# Patient Record
Sex: Female | Born: 1945 | Race: White | Hispanic: No | Smoking: Never smoker
Health system: Southern US, Community
[De-identification: ages and names within clinical notes are randomized; demographics above are authoritative.]

## PROBLEM LIST (undated history)

## (undated) DIAGNOSIS — Z888 Allergy status to other drugs, medicaments and biological substances status: Secondary | ICD-10-CM

## (undated) DIAGNOSIS — R112 Nausea with vomiting, unspecified: Secondary | ICD-10-CM

## (undated) DIAGNOSIS — I2089 Other forms of angina pectoris: Secondary | ICD-10-CM

## (undated) DIAGNOSIS — I739 Peripheral vascular disease, unspecified: Secondary | ICD-10-CM

## (undated) DIAGNOSIS — Z9889 Other specified postprocedural states: Secondary | ICD-10-CM

## (undated) DIAGNOSIS — I82409 Acute embolism and thrombosis of unspecified deep veins of unspecified lower extremity: Secondary | ICD-10-CM

## (undated) DIAGNOSIS — E119 Type 2 diabetes mellitus without complications: Secondary | ICD-10-CM

## (undated) DIAGNOSIS — I5189 Other ill-defined heart diseases: Secondary | ICD-10-CM

## (undated) DIAGNOSIS — I951 Orthostatic hypotension: Secondary | ICD-10-CM

## (undated) DIAGNOSIS — J189 Pneumonia, unspecified organism: Secondary | ICD-10-CM

## (undated) DIAGNOSIS — I208 Other forms of angina pectoris: Secondary | ICD-10-CM

## (undated) DIAGNOSIS — E785 Hyperlipidemia, unspecified: Secondary | ICD-10-CM

## (undated) DIAGNOSIS — I779 Disorder of arteries and arterioles, unspecified: Secondary | ICD-10-CM

## (undated) DIAGNOSIS — M1990 Unspecified osteoarthritis, unspecified site: Secondary | ICD-10-CM

## (undated) DIAGNOSIS — I2510 Atherosclerotic heart disease of native coronary artery without angina pectoris: Secondary | ICD-10-CM

## (undated) DIAGNOSIS — E669 Obesity, unspecified: Secondary | ICD-10-CM

## (undated) DIAGNOSIS — I119 Hypertensive heart disease without heart failure: Secondary | ICD-10-CM

## (undated) DIAGNOSIS — I219 Acute myocardial infarction, unspecified: Secondary | ICD-10-CM

## (undated) HISTORY — DX: Obesity, unspecified: E66.9

## (undated) HISTORY — DX: Hyperlipidemia, unspecified: E78.5

## (undated) HISTORY — PX: TUBAL LIGATION: SHX77

## (undated) HISTORY — PX: TOE SURGERY: SHX1073

## (undated) HISTORY — PX: TONSILLECTOMY: SUR1361

## (undated) HISTORY — PX: CARDIAC CATHETERIZATION: SHX172

## (undated) HISTORY — PX: APPENDECTOMY: SHX54

## (undated) HISTORY — DX: Atherosclerotic heart disease of native coronary artery without angina pectoris: I25.10

## (undated) HISTORY — DX: Peripheral vascular disease, unspecified: I73.9

## (undated) HISTORY — PX: REFRACTIVE SURGERY: SHX103

## (undated) HISTORY — DX: Allergy status to other drugs, medicaments and biological substances: Z88.8

## (undated) HISTORY — PX: CATARACT EXTRACTION W/ INTRAOCULAR LENS  IMPLANT, BILATERAL: SHX1307

## (undated) HISTORY — DX: Orthostatic hypotension: I95.1

## (undated) HISTORY — PX: EYE SURGERY: SHX253

---

## 1968-11-12 DIAGNOSIS — I82409 Acute embolism and thrombosis of unspecified deep veins of unspecified lower extremity: Secondary | ICD-10-CM

## 1968-11-12 HISTORY — DX: Acute embolism and thrombosis of unspecified deep veins of unspecified lower extremity: I82.409

## 2005-02-05 ENCOUNTER — Emergency Department (HOSPITAL_COMMUNITY): Admission: EM | Admit: 2005-02-05 | Discharge: 2005-02-05 | Payer: Self-pay | Admitting: Emergency Medicine

## 2010-01-15 ENCOUNTER — Encounter: Payer: Self-pay | Admitting: Cardiology

## 2010-03-14 HISTORY — PX: CORONARY ARTERY BYPASS GRAFT: SHX141

## 2010-03-14 HISTORY — PX: CHOLECYSTECTOMY: SHX55

## 2010-03-14 HISTORY — PX: OTHER SURGICAL HISTORY: SHX169

## 2010-03-26 ENCOUNTER — Ambulatory Visit
Admission: RE | Admit: 2010-03-26 | Discharge: 2010-03-26 | Payer: Self-pay | Source: Home / Self Care | Attending: Cardiology | Admitting: Cardiology

## 2010-03-26 ENCOUNTER — Inpatient Hospital Stay (HOSPITAL_COMMUNITY)
Admission: EM | Admit: 2010-03-26 | Discharge: 2010-04-08 | Payer: Self-pay | Source: Home / Self Care | Attending: Thoracic Surgery (Cardiothoracic Vascular Surgery) | Admitting: Thoracic Surgery (Cardiothoracic Vascular Surgery)

## 2010-03-26 ENCOUNTER — Encounter: Payer: Self-pay | Admitting: Cardiology

## 2010-03-26 DIAGNOSIS — E119 Type 2 diabetes mellitus without complications: Secondary | ICD-10-CM | POA: Insufficient documentation

## 2010-03-26 DIAGNOSIS — E039 Hypothyroidism, unspecified: Secondary | ICD-10-CM | POA: Insufficient documentation

## 2010-03-26 DIAGNOSIS — R079 Chest pain, unspecified: Secondary | ICD-10-CM | POA: Insufficient documentation

## 2010-03-27 ENCOUNTER — Encounter: Payer: Self-pay | Admitting: Cardiovascular Disease

## 2010-03-29 ENCOUNTER — Encounter: Payer: Self-pay | Admitting: Thoracic Surgery (Cardiothoracic Vascular Surgery)

## 2010-03-29 LAB — CARDIAC PANEL(CRET KIN+CKTOT+MB+TROPI)
CK, MB: 1.4 ng/mL (ref 0.3–4.0)
CK, MB: 1 ng/mL (ref 0.3–4.0)
CK, MB: 1 ng/mL (ref 0.3–4.0)
CK, MB: 0.8 ng/mL (ref 0.3–4.0)
CK, MB: 0.9 ng/mL (ref 0.3–4.0)
CK, MB: 0.8 ng/mL (ref 0.3–4.0)
CK, MB: 0.6 ng/mL (ref 0.3–4.0)
Relative Index: INVALID (ref 0.0–2.5)
Relative Index: INVALID (ref 0.0–2.5)
Relative Index: INVALID (ref 0.0–2.5)
Relative Index: INVALID (ref 0.0–2.5)
Relative Index: INVALID (ref 0.0–2.5)
Relative Index: INVALID (ref 0.0–2.5)
Relative Index: INVALID (ref 0.0–2.5)
Total CK: 36 U/L (ref 7–177)
Total CK: 30 U/L (ref 7–177)
Total CK: 35 U/L (ref 7–177)
Total CK: 33 U/L (ref 7–177)
Total CK: 29 U/L (ref 7–177)
Total CK: 36 U/L (ref 7–177)
Total CK: 25 U/L (ref 7–177)
Troponin I: 0.01 ng/mL (ref 0.00–0.06)
Troponin I: 0.03 ng/mL (ref 0.00–0.06)
Troponin I: 0.01 ng/mL (ref 0.00–0.06)
Troponin I: 0.02 ng/mL (ref 0.00–0.06)
Troponin I: 0.01 ng/mL (ref 0.00–0.06)
Troponin I: <0.01 ng/mL (ref 0.00–0.06)
Troponin I: 0.01 ng/mL (ref 0.00–0.06)

## 2010-03-29 LAB — BASIC METABOLIC PANEL
BUN: 16 mg/dL (ref 6–23)
BUN: 13 mg/dL (ref 6–23)
BUN: 11 mg/dL (ref 6–23)
CO2: 21 mEq/L (ref 19–32)
CO2: 24 mEq/L (ref 19–32)
CO2: 24 mEq/L (ref 19–32)
Calcium: 9.4 mg/dL (ref 8.4–10.5)
Calcium: 9 mg/dL (ref 8.4–10.5)
Calcium: 8.6 mg/dL (ref 8.4–10.5)
Chloride: 103 mEq/L (ref 96–112)
Chloride: 103 mEq/L (ref 96–112)
Chloride: 105 mEq/L (ref 96–112)
Creatinine, Ser: 0.52 mg/dL (ref 0.4–1.2)
Creatinine, Ser: 0.58 mg/dL (ref 0.4–1.2)
Creatinine, Ser: 0.64 mg/dL (ref 0.4–1.2)
GFR calc Af Amer: >60 mL/min (ref >60)
GFR calc Af Amer: >60 mL/min (ref >60)
GFR calc Af Amer: >60 mL/min (ref >60)
GFR calc non Af Amer: >60 mL/min (ref >60)
GFR calc non Af Amer: >60 mL/min (ref >60)
GFR calc non Af Amer: >60 mL/min (ref >60)
Glucose, Bld: 296 mg/dL — ABNORMAL HIGH (ref 70–99)
Glucose, Bld: 410 mg/dL — ABNORMAL HIGH (ref 70–99)
Glucose, Bld: 261 mg/dL — ABNORMAL HIGH (ref 70–99)
Potassium: 3.8 mEq/L (ref 3.5–5.1)
Potassium: 4 mEq/L (ref 3.5–5.1)
Potassium: 3.9 mEq/L (ref 3.5–5.1)
Sodium: 134 mEq/L — ABNORMAL LOW (ref 135–145)
Sodium: 136 mEq/L (ref 135–145)
Sodium: 138 mEq/L (ref 135–145)

## 2010-03-29 LAB — CBC
HCT: 41.2 % (ref 36.0–46.0)
HCT: 37.3 % (ref 36.0–46.0)
HCT: 37.5 % (ref 36.0–46.0)
Hemoglobin: 14 g/dL (ref 12.0–15.0)
Hemoglobin: 12.8 g/dL (ref 12.0–15.0)
Hemoglobin: 12.7 g/dL (ref 12.0–15.0)
MCH: 28.3 pg (ref 26.0–34.0)
MCH: 28.7 pg (ref 26.0–34.0)
MCH: 28.2 pg (ref 26.0–34.0)
MCHC: 34 g/dL (ref 30.0–36.0)
MCHC: 34.3 g/dL (ref 30.0–36.0)
MCHC: 33.9 g/dL (ref 30.0–36.0)
MCV: 83.4 fL (ref 78.0–100.0)
MCV: 83.6 fL (ref 78.0–100.0)
MCV: 83.3 fL (ref 78.0–100.0)
Platelets: 172 10*3/uL (ref 150–400)
Platelets: 163 10*3/uL (ref 150–400)
Platelets: 148 10*3/uL — ABNORMAL LOW (ref 150–400)
RBC: 4.94 MIL/uL (ref 3.87–5.11)
RBC: 4.46 MIL/uL (ref 3.87–5.11)
RBC: 4.5 MIL/uL (ref 3.87–5.11)
RDW: 12.9 % (ref 11.5–15.5)
RDW: 12.7 % (ref 11.5–15.5)
RDW: 12.8 % (ref 11.5–15.5)
WBC: 6.5 10*3/uL (ref 4.0–10.5)
WBC: 6 10*3/uL (ref 4.0–10.5)
WBC: 6.9 10*3/uL (ref 4.0–10.5)

## 2010-03-29 LAB — CK TOTAL AND CKMB (NOT AT ARMC)
CK, MB: 1.6 ng/mL (ref 0.3–4.0)
Relative Index: INVALID (ref 0.0–2.5)
Total CK: 44 U/L (ref 7–177)

## 2010-03-29 LAB — POCT CARDIAC MARKERS
CKMB, poc: 1.5 ng/mL (ref 1.0–8.0)
CKMB, poc: 1.5 ng/mL (ref 1.0–8.0)
Myoglobin, poc: 44.9 ng/mL (ref 12–200)
Myoglobin, poc: 55.5 ng/mL (ref 12–200)
Troponin i, poc: <0.05 ng/mL (ref 0.00–0.09)
Troponin i, poc: <0.05 ng/mL (ref 0.00–0.09)

## 2010-03-29 LAB — HEPATIC FUNCTION PANEL
ALT: 15 U/L (ref 0–35)
AST: 14 U/L (ref 0–37)
Albumin: 3.8 g/dL (ref 3.5–5.2)
Alkaline Phosphatase: 54 U/L (ref 39–117)
Bilirubin, Direct: 0.1 mg/dL (ref 0.0–0.3)
Indirect Bilirubin: 0.6 mg/dL (ref 0.3–0.9)
Total Bilirubin: 0.7 mg/dL (ref 0.3–1.2)
Total Protein: 7.2 g/dL (ref 6.0–8.3)

## 2010-03-29 LAB — GLUCOSE, CAPILLARY
Glucose-Capillary: 183 mg/dL — ABNORMAL HIGH (ref 70–99)
Glucose-Capillary: 181 mg/dL — ABNORMAL HIGH (ref 70–99)
Glucose-Capillary: 294 mg/dL — ABNORMAL HIGH (ref 70–99)
Glucose-Capillary: 272 mg/dL — ABNORMAL HIGH (ref 70–99)
Glucose-Capillary: 266 mg/dL — ABNORMAL HIGH (ref 70–99)
Glucose-Capillary: 209 mg/dL — ABNORMAL HIGH (ref 70–99)
Glucose-Capillary: 233 mg/dL — ABNORMAL HIGH (ref 70–99)
Glucose-Capillary: 251 mg/dL — ABNORMAL HIGH (ref 70–99)
Glucose-Capillary: 201 mg/dL — ABNORMAL HIGH (ref 70–99)
Glucose-Capillary: 159 mg/dL — ABNORMAL HIGH (ref 70–99)

## 2010-03-29 LAB — PROTIME-INR
INR: 0.98 (ref 0.00–1.49)
Prothrombin Time: 13.2 seconds (ref 11.6–15.2)

## 2010-03-29 LAB — HEMOGLOBIN A1C
Hgb A1c MFr Bld: 13.3 % — ABNORMAL HIGH (ref <5.7)
Mean Plasma Glucose: 335 mg/dL — ABNORMAL HIGH (ref <117)

## 2010-03-29 LAB — LIPID PANEL
Cholesterol: 251 mg/dL — ABNORMAL HIGH (ref 0–200)
HDL: 38 mg/dL — ABNORMAL LOW (ref >39)
LDL Cholesterol: UNDETERMINED mg/dL (ref 0–99)
Total CHOL/HDL Ratio: 6.6 RATIO
Triglycerides: 469 mg/dL — ABNORMAL HIGH (ref <150)
VLDL: UNDETERMINED mg/dL (ref 0–40)

## 2010-03-29 LAB — HEPARIN LEVEL (UNFRACTIONATED)
Heparin Unfractionated: 0.11 IU/mL — ABNORMAL LOW (ref 0.30–0.70)
Heparin Unfractionated: <0.1 IU/mL — ABNORMAL LOW (ref 0.30–0.70)
Heparin Unfractionated: <0.1 IU/mL — ABNORMAL LOW (ref 0.30–0.70)
Heparin Unfractionated: 0.17 IU/mL — ABNORMAL LOW (ref 0.30–0.70)
Heparin Unfractionated: 0.28 IU/mL — ABNORMAL LOW (ref 0.30–0.70)
Heparin Unfractionated: 0.49 IU/mL (ref 0.30–0.70)

## 2010-03-29 LAB — TROPONIN I: Troponin I: 0.04 ng/mL (ref 0.00–0.06)

## 2010-03-29 LAB — MRSA PCR SCREENING: MRSA by PCR: NEGATIVE

## 2010-03-29 LAB — TSH: TSH: 0.501 u[IU]/mL (ref 0.350–4.500)

## 2010-03-31 LAB — CBC
HCT: 37 % (ref 36.0–46.0)
HCT: 36.6 % (ref 36.0–46.0)
Hemoglobin: 12.6 g/dL (ref 12.0–15.0)
Hemoglobin: 12.4 g/dL (ref 12.0–15.0)
MCH: 28.5 pg (ref 26.0–34.0)
MCH: 28.2 pg (ref 26.0–34.0)
MCHC: 34.1 g/dL (ref 30.0–36.0)
MCHC: 33.9 g/dL (ref 30.0–36.0)
MCV: 83.7 fL (ref 78.0–100.0)
MCV: 83.2 fL (ref 78.0–100.0)
Platelets: 147 10*3/uL — ABNORMAL LOW (ref 150–400)
Platelets: 162 10*3/uL (ref 150–400)
RBC: 4.42 MIL/uL (ref 3.87–5.11)
RBC: 4.4 MIL/uL (ref 3.87–5.11)
RDW: 12.8 % (ref 11.5–15.5)
RDW: 12.7 % (ref 11.5–15.5)
WBC: 6.7 10*3/uL (ref 4.0–10.5)
WBC: 7.7 10*3/uL (ref 4.0–10.5)

## 2010-03-31 LAB — COMPREHENSIVE METABOLIC PANEL
ALT: 11 U/L (ref 0–35)
AST: 11 U/L (ref 0–37)
Albumin: 3 g/dL — ABNORMAL LOW (ref 3.5–5.2)
Alkaline Phosphatase: 43 U/L (ref 39–117)
BUN: 10 mg/dL (ref 6–23)
CO2: 25 mEq/L (ref 19–32)
Calcium: 8.8 mg/dL (ref 8.4–10.5)
Chloride: 104 mEq/L (ref 96–112)
Creatinine, Ser: 0.57 mg/dL (ref 0.4–1.2)
GFR calc Af Amer: >60 mL/min (ref >60)
GFR calc non Af Amer: >60 mL/min (ref >60)
Glucose, Bld: 218 mg/dL — ABNORMAL HIGH (ref 70–99)
Potassium: 3.8 mEq/L (ref 3.5–5.1)
Sodium: 138 mEq/L (ref 135–145)
Total Bilirubin: 0.8 mg/dL (ref 0.3–1.2)
Total Protein: 6.3 g/dL (ref 6.0–8.3)

## 2010-03-31 LAB — CARDIAC PANEL(CRET KIN+CKTOT+MB+TROPI)
CK, MB: 0.8 ng/mL (ref 0.3–4.0)
CK, MB: 0.6 ng/mL (ref 0.3–4.0)
CK, MB: 0.5 ng/mL (ref 0.3–4.0)
CK, MB: 0.7 ng/mL (ref 0.3–4.0)
CK, MB: 0.7 ng/mL (ref 0.3–4.0)
CK, MB: 0.4 ng/mL (ref 0.3–4.0)
Relative Index: INVALID (ref 0.0–2.5)
Relative Index: INVALID (ref 0.0–2.5)
Relative Index: INVALID (ref 0.0–2.5)
Relative Index: INVALID (ref 0.0–2.5)
Relative Index: INVALID (ref 0.0–2.5)
Relative Index: INVALID (ref 0.0–2.5)
Total CK: 27 U/L (ref 7–177)
Total CK: 26 U/L (ref 7–177)
Total CK: 27 U/L (ref 7–177)
Total CK: 22 U/L (ref 7–177)
Total CK: 19 U/L (ref 7–177)
Total CK: 26 U/L (ref 7–177)
Troponin I: 0.01 ng/mL (ref 0.00–0.06)
Troponin I: <0.01 ng/mL (ref 0.00–0.06)
Troponin I: <0.01 ng/mL (ref 0.00–0.06)
Troponin I: 0.02 ng/mL (ref 0.00–0.06)
Troponin I: 0.01 ng/mL (ref 0.00–0.06)
Troponin I: <0.01 ng/mL (ref 0.00–0.06)

## 2010-03-31 LAB — BASIC METABOLIC PANEL
BUN: 9 mg/dL (ref 6–23)
CO2: 23 mEq/L (ref 19–32)
Calcium: 8.5 mg/dL (ref 8.4–10.5)
Chloride: 101 mEq/L (ref 96–112)
Creatinine, Ser: 0.54 mg/dL (ref 0.4–1.2)
GFR calc Af Amer: >60 mL/min (ref >60)
GFR calc non Af Amer: >60 mL/min (ref >60)
Glucose, Bld: 210 mg/dL — ABNORMAL HIGH (ref 70–99)
Potassium: 3.4 mEq/L — ABNORMAL LOW (ref 3.5–5.1)
Sodium: 134 mEq/L — ABNORMAL LOW (ref 135–145)

## 2010-03-31 LAB — URINALYSIS, ROUTINE W REFLEX MICROSCOPIC
Bilirubin Urine: NEGATIVE
Hgb urine dipstick: NEGATIVE
Ketones, ur: 15 mg/dL — AB
Nitrite: NEGATIVE
Protein, ur: NEGATIVE mg/dL
Specific Gravity, Urine: 1.014 (ref 1.005–1.030)
Urine Glucose, Fasting: 250 mg/dL — AB
Urobilinogen, UA: 1 mg/dL (ref 0.0–1.0)
pH: 5.5 (ref 5.0–8.0)

## 2010-03-31 LAB — GLUCOSE, CAPILLARY
Glucose-Capillary: 227 mg/dL — ABNORMAL HIGH (ref 70–99)
Glucose-Capillary: 221 mg/dL — ABNORMAL HIGH (ref 70–99)
Glucose-Capillary: 250 mg/dL — ABNORMAL HIGH (ref 70–99)
Glucose-Capillary: 246 mg/dL — ABNORMAL HIGH (ref 70–99)
Glucose-Capillary: 242 mg/dL — ABNORMAL HIGH (ref 70–99)
Glucose-Capillary: 228 mg/dL — ABNORMAL HIGH (ref 70–99)
Glucose-Capillary: 246 mg/dL — ABNORMAL HIGH (ref 70–99)
Glucose-Capillary: 214 mg/dL — ABNORMAL HIGH (ref 70–99)
Glucose-Capillary: 182 mg/dL — ABNORMAL HIGH (ref 70–99)

## 2010-03-31 LAB — DIFFERENTIAL
Basophils Absolute: 0 10*3/uL (ref 0.0–0.1)
Basophils Relative: 0 % (ref 0–1)
Eosinophils Absolute: 0.1 10*3/uL (ref 0.0–0.7)
Eosinophils Relative: 1 % (ref 0–5)
Lymphocytes Relative: 18 % (ref 12–46)
Lymphs Abs: 1.4 10*3/uL (ref 0.7–4.0)
Monocytes Absolute: 0.7 10*3/uL (ref 0.1–1.0)
Monocytes Relative: 9 % (ref 3–12)
Neutro Abs: 5.5 10*3/uL (ref 1.7–7.7)
Neutrophils Relative %: 71 % (ref 43–77)

## 2010-03-31 LAB — HEPARIN LEVEL (UNFRACTIONATED)
Heparin Unfractionated: 0.66 IU/mL (ref 0.30–0.70)
Heparin Unfractionated: 0.58 IU/mL (ref 0.30–0.70)

## 2010-03-31 LAB — PROTIME-INR
INR: 1.08 (ref 0.00–1.49)
Prothrombin Time: 14.2 seconds (ref 11.6–15.2)

## 2010-03-31 LAB — URINE CULTURE
Colony Count: NO GROWTH
Culture  Setup Time: 201201170128
Culture: NO GROWTH

## 2010-03-31 LAB — APTT: aPTT: 109 seconds — ABNORMAL HIGH (ref 24–37)

## 2010-03-31 LAB — TYPE AND SCREEN
ABO/RH(D): B POS
Antibody Screen: NEGATIVE

## 2010-03-31 LAB — ABO/RH: ABO/RH(D): B POS

## 2010-04-01 NOTE — Op Note (Signed)
[name], [name]               ACCOUNT NO.:  [id number]  MEDICAL RECORD NO.:  [id number]           PATIENT TYPE:  LOCATION:                                 FACILITY:  PHYSICIAN:  Salvatore Decent. Cornelius Moras, M.D. DATE OF BIRTH:  [birth year]  DATE OF PROCEDURE:  03/31/2010 DATE OF DISCHARGE:                              OPERATIVE REPORT   DATE OF OPERATION:  March 31, 2010  PREOPERATIVE DIAGNOSIS:  Severe three-vessel coronary artery disease.  POSTOPERATIVE DIAGNOSIS:  Severe three-vessel coronary artery disease.  PROCEDURE:  Median sternotomy for coronary artery bypass grafting x3 (left internal mammary artery to distal left anterior descending coronary artery, saphenous vein graft to second diagonal branch, saphenous vein graft to second obtuse marginal branch of the left circumflex coronary artery, endoscopic saphenous vein harvest from right thigh and right lower leg).  SURGEON:  Salvatore Decent. Cornelius Moras, MD  ASSISTANT:  Coral Ceo, PAANESTHESIOLOGIST:  Zenon Mayo, MD.  BRIEF CLINICAL NOTE:  The patient is a 65 year old obese white female with uncontrolled type 2 diabetes mellitus and hyperlipidemia.  The patient presents with unstable angina.  Cardiac catheterization demonstrates severe three-vessel coronary artery disease with mild left ventricular dysfunction.  There is very diffuse distal coronary artery disease with poor target vessels for grafting.  A full consultation has been dictated previously.  The patient has been counseled at length regarding the indications, risks, and potential benefits of surgery. The patient understands that because of the likely poor target vessels for grafting, she will be at increased risk for perioperative myocardial infarction, premature graft closure, or other late consequences of recurrent coronary artery disease.  All of her questions have been addressed.  OPERATIVE FINDINGS: 1. Mild left ventricular dysfunction with inferior  wall hypokinesis. 2. Good-quality left internal mammary artery and saphenous vein     conduit for grafting. 3. Small diffusely diseased poor target vessels for grafting.  OPERATIVE PROCEDURE IN DETAIL:  The patient was brought to the operating room on the above-mentioned date and central monitoring was established by the anesthesia team under the care and direction of Dr. Autumn Patty.  Specifically, a Swan-Ganz catheter was placed through the right internal jugular approach.  A radial arterial line was placed. Intravenous antibiotics were administered.  Following induction with general endotracheal anesthesia, Foley catheter was placed.  The patient's chest, abdomen, both groins, and both lower extremities were prepared and draped in sterile manner.  Baseline transesophageal echocardiogram was performed by Dr. Sampson Goon.  This demonstrates mild left ventricular dysfunction with mild inferior wall hypokinesis.  No other significant abnormalities are noted.  A median sternotomy incision was performed and left internal mammary artery was dissected from the chest wall and prepared for bypass grafting.  The left internal mammary artery is good-quality conduit. Simultaneously, saphenous vein was obtained from the patient's right thigh and the upper portion of the right lower leg using endoscopic vein harvest technique.  The saphenous vein is good-quality conduit.  After the saphenous vein has been removed, the small incisions in the right lower extremity are closed with absorbable suture.  Following systemic heparinization, the left internal mammary artery was  transected distally and noted to have excellent flow.  The pericardium was opened.  The ascending aorta was normal in appearance.  The ascending aorta and the right atrium were both cannulated for cardiopulmonary bypass.  A retrograde cardioplegic cannula was placed through the right atrium into the coronary  sinus.  Cardiopulmonary bypass was begun, and the surface of the heart was inspected.  Distal target vessels were selected for coronary bypass grafting.  The patient has very small and diffusely diseased coronary arteries with poor target vessels for grafting.  The terminal branches of the left circumflex coronary artery including the posterior descending coronary artery are too small and diffusely diseased for grafting.  A temperature probe is placed left ventricular septum and a cardioplegic cannula is placed in the ascending aorta.  The patient is allowed to cool passively to 32 degrees systemic temperature.  The aortic crossclamp was applied and cold blood cardioplegia was delivered in antegrade fashion through the aortic root. Iced saline slush was applied for topical hypothermia.  The initial cardioplegic arrest and myocardial cooling was notably excellent. Supplemental cardioplegia is administered retrograde through the coronary sinus catheter.  Repeat doses of cardioplegia were administered intermittently throughout the entire crossclamp portion of the operation through the aortic root, down the subsequently placed vein grafts, and retrograde through the coronary sinus catheter to maintain completely flat electrocardiogram and left ventricular septal myocardial temperature below 15 degrees centigrade.  Myocardial protection is felt to be excellent.  The following distal coronary anastomoses were performed: 1. The second obtuse marginal branch of the left circumflex coronary     artery is grafted with a saphenous vein graft in an end-to-side     fashion.  This vessel probes with a 1.0 probe.  It is diffusely     diseased and subtotally occluded proximally.  It is a poor target     vessel for grafting.  It is the only graftable target on the     lateral wall. 2. The second diagonal branch of the left anterior descending coronary     artery was grafted with a saphenous vein  graft in end-to-side     fashion.  This vessel probes with a 1.0 probe.  It is small and     diffusely diseased.  It is the largest diagonal branch that can be     appreciated on the surface of the heart. 3. The distal left anterior descending coronary artery is grafted with     left internal mammary artery in end-to-side fashion.  This vessel     probed easily with a 1.0 probe and is probably 1.2 mm in diameter.     A 1.5 probe will not pass in either direction.  This vessel was     diffusely diseased.  It is by far the best target vessel, but it     remains a poor target vessel for grafting.  The terminal branches of the left circumflex coronary artery including the left posterolateral branch and the left posterior descending coronary artery are too small and diffusely diseased for grafting.  Both proximal saphenous vein anastomoses were performed directly to the ascending aorta prior to removal of the aortic crossclamp.  The left ventricular septal temperature rises rapidly with reperfusion of the left internal mammary artery.  One final dose of warm retrograde hot shot cardioplegia was administered.  The aortic crossclamp was removed after total crossclamp time of 60 minutes.  The heart was cardioverted and normal sinus rhythm resumed.  All  proximal and distal coronary anastomoses are inspected for hemostasis and appropriate graft orientation.  Epicardial pacing wires were fixed to the right ventricular outflow tract into the right atrial appendage.  The patient was rewarmed to 37 degrees centigrade temperature.  The patient is weaned from cardiopulmonary bypass without difficulty.  The patient's rhythm at separation from bypass was normal sinus rhythm.  No inotropic support was required.  Total cardiopulmonary bypass time for the operation is 76 minutes.  Followup transesophageal echocardiogram performed by Dr. Sampson Goon after separation from bypass demonstrates no changes  from preoperatively.  The venous and arterial cannulae were removed uneventfully.  Protamine was administered to reverse the anticoagulation.  The mediastinum and the left pleural space were irrigated with saline solution.  Meticulous surgical hemostasis was ascertained.  The mediastinum and the left pleural space were drained with three chest tubes placed through separate stab incisions inferiorly.  The pericardium and soft tissues anterior to the aorta were reapproximated loosely.  The sternum was closed using double-strength sternal wire.  The soft tissues anterior to the sternum are closed in multiple layers, and the skin was closed using a running subcuticular skin closure.  The patient tolerated the procedure well and was transported to the surgical intensive care unit in stable condition.  There are no intraoperative complications.  All sponge, instrument, and needle counts were verified, correct at completion of the operation.  No blood products were administered.     Salvatore Decent. Cornelius Moras, M.D.     CHO/MEDQ  D:  03/31/2010  T:  04/01/2010  Job:  [id number]  cc:   Rollene Rotunda, MD, Gwynne Edinger. Excell Seltzer, MD Jerel Shepherd  Electronically Signed by Tressie Stalker M.D. on 04/01/2010 11:57:15 AM

## 2010-04-05 LAB — CBC
HCT: 35.5 % — ABNORMAL LOW (ref 36.0–46.0)
HCT: 28.2 % — ABNORMAL LOW (ref 36.0–46.0)
HCT: 30.4 % — ABNORMAL LOW (ref 36.0–46.0)
HCT: 28.9 % — ABNORMAL LOW (ref 36.0–46.0)
HCT: 29.7 % — ABNORMAL LOW (ref 36.0–46.0)
Hemoglobin: 12.2 g/dL (ref 12.0–15.0)
Hemoglobin: 9.6 g/dL — ABNORMAL LOW (ref 12.0–15.0)
Hemoglobin: 10.3 g/dL — ABNORMAL LOW (ref 12.0–15.0)
Hemoglobin: 10.2 g/dL — ABNORMAL LOW (ref 12.0–15.0)
Hemoglobin: 10 g/dL — ABNORMAL LOW (ref 12.0–15.0)
MCH: 28.4 pg (ref 26.0–34.0)
MCH: 28.2 pg (ref 26.0–34.0)
MCH: 28 pg (ref 26.0–34.0)
MCH: 29 pg (ref 26.0–34.0)
MCH: 28.5 pg (ref 26.0–34.0)
MCHC: 34.4 g/dL (ref 30.0–36.0)
MCHC: 34 g/dL (ref 30.0–36.0)
MCHC: 33.9 g/dL (ref 30.0–36.0)
MCHC: 35.3 g/dL (ref 30.0–36.0)
MCHC: 33.7 g/dL (ref 30.0–36.0)
MCV: 82.8 fL (ref 78.0–100.0)
MCV: 82.7 fL (ref 78.0–100.0)
MCV: 82.6 fL (ref 78.0–100.0)
MCV: 82.1 fL (ref 78.0–100.0)
MCV: 84.6 fL (ref 78.0–100.0)
Platelets: 147 10*3/uL — ABNORMAL LOW (ref 150–400)
Platelets: 113 10*3/uL — ABNORMAL LOW (ref 150–400)
Platelets: 117 10*3/uL — ABNORMAL LOW (ref 150–400)
Platelets: 124 10*3/uL — ABNORMAL LOW (ref 150–400)
Platelets: 178 10*3/uL (ref 150–400)
RBC: 4.29 MIL/uL (ref 3.87–5.11)
RBC: 3.41 MIL/uL — ABNORMAL LOW (ref 3.87–5.11)
RBC: 3.68 MIL/uL — ABNORMAL LOW (ref 3.87–5.11)
RBC: 3.52 MIL/uL — ABNORMAL LOW (ref 3.87–5.11)
RBC: 3.51 MIL/uL — ABNORMAL LOW (ref 3.87–5.11)
RDW: 12.6 % (ref 11.5–15.5)
RDW: 12.5 % (ref 11.5–15.5)
RDW: 12.6 % (ref 11.5–15.5)
RDW: 12.7 % (ref 11.5–15.5)
RDW: 12.9 % (ref 11.5–15.5)
WBC: 8.1 10*3/uL (ref 4.0–10.5)
WBC: 10.1 10*3/uL (ref 4.0–10.5)
WBC: 10.4 10*3/uL (ref 4.0–10.5)
WBC: 10 10*3/uL (ref 4.0–10.5)
WBC: 11.7 10*3/uL — ABNORMAL HIGH (ref 4.0–10.5)

## 2010-04-05 LAB — POCT I-STAT, CHEM 8
BUN: 9 mg/dL (ref 6–23)
Calcium, Ion: 1.11 mmol/L — ABNORMAL LOW (ref 1.12–1.32)
Chloride: 108 mEq/L (ref 96–112)
Creatinine, Ser: 0.5 mg/dL (ref 0.4–1.2)
Glucose, Bld: 144 mg/dL — ABNORMAL HIGH (ref 70–99)
HCT: 29 % — ABNORMAL LOW (ref 36.0–46.0)
Hemoglobin: 9.9 g/dL — ABNORMAL LOW (ref 12.0–15.0)
Potassium: 4.3 mEq/L (ref 3.5–5.1)
Sodium: 139 mEq/L (ref 135–145)
TCO2: 22 mmol/L (ref 0–100)

## 2010-04-05 LAB — POCT I-STAT 3, ART BLOOD GAS (G3+)
Acid-base deficit: 2 mmol/L (ref 0.0–2.0)
Acid-base deficit: 3 mmol/L — ABNORMAL HIGH (ref 0.0–2.0)
Bicarbonate: 23.1 mEq/L (ref 20.0–24.0)
Bicarbonate: 21.8 mEq/L (ref 20.0–24.0)
O2 Saturation: 94 %
O2 Saturation: 98 %
Patient temperature: 36.4
Patient temperature: 37.1
TCO2: 24 mmol/L (ref 0–100)
TCO2: 23 mmol/L (ref 0–100)
pCO2 arterial: 37.7 mmHg (ref 35.0–45.0)
pCO2 arterial: 37.9 mmHg (ref 35.0–45.0)
pH, Arterial: 7.394 (ref 7.350–7.400)
pH, Arterial: 7.368 (ref 7.350–7.400)
pO2, Arterial: 70 mmHg — ABNORMAL LOW (ref 80.0–100.0)
pO2, Arterial: 112 mmHg — ABNORMAL HIGH (ref 80.0–100.0)

## 2010-04-05 LAB — BASIC METABOLIC PANEL
BUN: 10 mg/dL (ref 6–23)
BUN: 12 mg/dL (ref 6–23)
CO2: 21 mEq/L (ref 19–32)
CO2: 27 mEq/L (ref 19–32)
Calcium: 7.9 mg/dL — ABNORMAL LOW (ref 8.4–10.5)
Calcium: 8.1 mg/dL — ABNORMAL LOW (ref 8.4–10.5)
Chloride: 106 mEq/L (ref 96–112)
Chloride: 102 mEq/L (ref 96–112)
Creatinine, Ser: 0.61 mg/dL (ref 0.4–1.2)
Creatinine, Ser: 0.65 mg/dL (ref 0.4–1.2)
GFR calc Af Amer: >60 mL/min (ref >60)
GFR calc Af Amer: >60 mL/min (ref >60)
GFR calc non Af Amer: >60 mL/min (ref >60)
GFR calc non Af Amer: >60 mL/min (ref >60)
Glucose, Bld: 149 mg/dL — ABNORMAL HIGH (ref 70–99)
Glucose, Bld: 160 mg/dL — ABNORMAL HIGH (ref 70–99)
Potassium: 3.9 mEq/L (ref 3.5–5.1)
Potassium: 3.7 mEq/L (ref 3.5–5.1)
Sodium: 139 mEq/L (ref 135–145)
Sodium: 138 mEq/L (ref 135–145)

## 2010-04-05 LAB — HEMOGLOBIN AND HEMATOCRIT, BLOOD
HCT: 24.1 % — ABNORMAL LOW (ref 36.0–46.0)
Hemoglobin: 8.2 g/dL — ABNORMAL LOW (ref 12.0–15.0)

## 2010-04-05 LAB — CARDIAC PANEL(CRET KIN+CKTOT+MB+TROPI)
CK, MB: 0.9 ng/mL (ref 0.3–4.0)
Relative Index: INVALID (ref 0.0–2.5)
Total CK: 23 U/L (ref 7–177)
Troponin I: 0.03 ng/mL (ref 0.00–0.06)

## 2010-04-05 LAB — GLUCOSE, CAPILLARY
Glucose-Capillary: 107 mg/dL — ABNORMAL HIGH (ref 70–99)
Glucose-Capillary: 124 mg/dL — ABNORMAL HIGH (ref 70–99)
Glucose-Capillary: 178 mg/dL — ABNORMAL HIGH (ref 70–99)
Glucose-Capillary: 184 mg/dL — ABNORMAL HIGH (ref 70–99)
Glucose-Capillary: 84 mg/dL (ref 70–99)
Glucose-Capillary: 95 mg/dL (ref 70–99)
Glucose-Capillary: 123 mg/dL — ABNORMAL HIGH (ref 70–99)
Glucose-Capillary: 129 mg/dL — ABNORMAL HIGH (ref 70–99)
Glucose-Capillary: 139 mg/dL — ABNORMAL HIGH (ref 70–99)
Glucose-Capillary: 171 mg/dL — ABNORMAL HIGH (ref 70–99)
Glucose-Capillary: 177 mg/dL — ABNORMAL HIGH (ref 70–99)
Glucose-Capillary: 127 mg/dL — ABNORMAL HIGH (ref 70–99)
Glucose-Capillary: 134 mg/dL — ABNORMAL HIGH (ref 70–99)
Glucose-Capillary: 140 mg/dL — ABNORMAL HIGH (ref 70–99)
Glucose-Capillary: 163 mg/dL — ABNORMAL HIGH (ref 70–99)

## 2010-04-05 LAB — PLATELET COUNT: Platelets: 133 10*3/uL — ABNORMAL LOW (ref 150–400)

## 2010-04-05 LAB — PROTIME-INR
INR: 1.4 (ref 0.00–1.49)
Prothrombin Time: 17.4 seconds — ABNORMAL HIGH (ref 11.6–15.2)

## 2010-04-05 LAB — POCT I-STAT 4, (NA,K, GLUC, HGB,HCT)
Glucose, Bld: 130 mg/dL — ABNORMAL HIGH (ref 70–99)
HCT: 39 % (ref 36.0–46.0)
Hemoglobin: 13.3 g/dL (ref 12.0–15.0)
Potassium: 3.6 mEq/L (ref 3.5–5.1)
Sodium: 140 mEq/L (ref 135–145)

## 2010-04-05 LAB — CREATININE, SERUM
Creatinine, Ser: 0.53 mg/dL (ref 0.4–1.2)
GFR calc Af Amer: >60 mL/min (ref >60)
GFR calc non Af Amer: >60 mL/min (ref >60)

## 2010-04-05 LAB — APTT: aPTT: 41 seconds — ABNORMAL HIGH (ref 24–37)

## 2010-04-05 LAB — HEPARIN LEVEL (UNFRACTIONATED): Heparin Unfractionated: 0.34 IU/mL (ref 0.30–0.70)

## 2010-04-05 LAB — MAGNESIUM
Magnesium: 2.8 mg/dL — ABNORMAL HIGH (ref 1.5–2.5)
Magnesium: 2.5 mg/dL (ref 1.5–2.5)

## 2010-04-06 LAB — GLUCOSE, CAPILLARY
Glucose-Capillary: 107 mg/dL — ABNORMAL HIGH (ref 70–99)
Glucose-Capillary: 154 mg/dL — ABNORMAL HIGH (ref 70–99)
Glucose-Capillary: 101 mg/dL — ABNORMAL HIGH (ref 70–99)
Glucose-Capillary: 104 mg/dL — ABNORMAL HIGH (ref 70–99)
Glucose-Capillary: 151 mg/dL — ABNORMAL HIGH (ref 70–99)
Glucose-Capillary: 161 mg/dL — ABNORMAL HIGH (ref 70–99)
Glucose-Capillary: 137 mg/dL — ABNORMAL HIGH (ref 70–99)
Glucose-Capillary: 156 mg/dL — ABNORMAL HIGH (ref 70–99)
Glucose-Capillary: 108 mg/dL — ABNORMAL HIGH (ref 70–99)
Glucose-Capillary: 118 mg/dL — ABNORMAL HIGH (ref 70–99)
Glucose-Capillary: 164 mg/dL — ABNORMAL HIGH (ref 70–99)
Glucose-Capillary: 119 mg/dL — ABNORMAL HIGH (ref 70–99)

## 2010-04-06 LAB — BASIC METABOLIC PANEL
BUN: 11 mg/dL (ref 6–23)
BUN: 12 mg/dL (ref 6–23)
BUN: 9 mg/dL (ref 6–23)
CO2: 28 mEq/L (ref 19–32)
CO2: 29 mEq/L (ref 19–32)
CO2: 27 mEq/L (ref 19–32)
Calcium: 8.2 mg/dL — ABNORMAL LOW (ref 8.4–10.5)
Calcium: 8.2 mg/dL — ABNORMAL LOW (ref 8.4–10.5)
Calcium: 8.6 mg/dL (ref 8.4–10.5)
Chloride: 101 mEq/L (ref 96–112)
Chloride: 100 mEq/L (ref 96–112)
Chloride: 104 mEq/L (ref 96–112)
Creatinine, Ser: 0.59 mg/dL (ref 0.4–1.2)
Creatinine, Ser: 0.62 mg/dL (ref 0.4–1.2)
Creatinine, Ser: 0.58 mg/dL (ref 0.4–1.2)
GFR calc Af Amer: >60 mL/min (ref >60)
GFR calc Af Amer: >60 mL/min (ref >60)
GFR calc Af Amer: >60 mL/min (ref >60)
GFR calc non Af Amer: >60 mL/min (ref >60)
GFR calc non Af Amer: >60 mL/min (ref >60)
GFR calc non Af Amer: >60 mL/min (ref >60)
Glucose, Bld: 104 mg/dL — ABNORMAL HIGH (ref 70–99)
Glucose, Bld: 98 mg/dL (ref 70–99)
Glucose, Bld: 107 mg/dL — ABNORMAL HIGH (ref 70–99)
Potassium: 3.2 mEq/L — ABNORMAL LOW (ref 3.5–5.1)
Potassium: 3.3 mEq/L — ABNORMAL LOW (ref 3.5–5.1)
Potassium: 3.9 mEq/L (ref 3.5–5.1)
Sodium: 139 mEq/L (ref 135–145)
Sodium: 137 mEq/L (ref 135–145)
Sodium: 139 mEq/L (ref 135–145)

## 2010-04-06 LAB — URINALYSIS, ROUTINE W REFLEX MICROSCOPIC
Hgb urine dipstick: NEGATIVE
Ketones, ur: 40 mg/dL — AB
Leukocytes, UA: NEGATIVE
Nitrite: NEGATIVE
Protein, ur: 30 mg/dL — AB
Specific Gravity, Urine: 1.033 — ABNORMAL HIGH (ref 1.005–1.030)
Urine Glucose, Fasting: NEGATIVE mg/dL
Urobilinogen, UA: 4 mg/dL — ABNORMAL HIGH (ref 0.0–1.0)
pH: 6 (ref 5.0–8.0)

## 2010-04-06 LAB — CBC
HCT: 30.3 % — ABNORMAL LOW (ref 36.0–46.0)
Hemoglobin: 10.3 g/dL — ABNORMAL LOW (ref 12.0–15.0)
MCH: 28.1 pg (ref 26.0–34.0)
MCHC: 34 g/dL (ref 30.0–36.0)
MCV: 82.6 fL (ref 78.0–100.0)
Platelets: 242 10*3/uL (ref 150–400)
RBC: 3.67 MIL/uL — ABNORMAL LOW (ref 3.87–5.11)
RDW: 12.4 % (ref 11.5–15.5)
WBC: 8.9 10*3/uL (ref 4.0–10.5)

## 2010-04-06 LAB — URINE CULTURE
Colony Count: 2000
Culture  Setup Time: 201201221147
Special Requests: NEGATIVE

## 2010-04-06 LAB — URINE MICROSCOPIC-ADD ON

## 2010-04-06 LAB — TSH: TSH: 0.954 u[IU]/mL (ref 0.350–4.500)

## 2010-04-06 LAB — MAGNESIUM: Magnesium: 2.2 mg/dL (ref 1.5–2.5)

## 2010-04-07 LAB — URINALYSIS, ROUTINE W REFLEX MICROSCOPIC
Hgb urine dipstick: NEGATIVE
Ketones, ur: NEGATIVE mg/dL
Nitrite: NEGATIVE
Protein, ur: NEGATIVE mg/dL
Specific Gravity, Urine: 1.023 (ref 1.005–1.030)
Urine Glucose, Fasting: 100 mg/dL — AB
Urobilinogen, UA: 4 mg/dL — ABNORMAL HIGH (ref 0.0–1.0)
pH: 6 (ref 5.0–8.0)

## 2010-04-07 LAB — GLUCOSE, CAPILLARY
Glucose-Capillary: 125 mg/dL — ABNORMAL HIGH (ref 70–99)
Glucose-Capillary: 174 mg/dL — ABNORMAL HIGH (ref 70–99)
Glucose-Capillary: 126 mg/dL — ABNORMAL HIGH (ref 70–99)
Glucose-Capillary: 135 mg/dL — ABNORMAL HIGH (ref 70–99)
Glucose-Capillary: 166 mg/dL — ABNORMAL HIGH (ref 70–99)
Glucose-Capillary: 195 mg/dL — ABNORMAL HIGH (ref 70–99)
Glucose-Capillary: 177 mg/dL — ABNORMAL HIGH (ref 70–99)
Glucose-Capillary: 172 mg/dL — ABNORMAL HIGH (ref 70–99)

## 2010-04-07 LAB — BASIC METABOLIC PANEL
BUN: 6 mg/dL (ref 6–23)
CO2: 29 mEq/L (ref 19–32)
Calcium: 8.9 mg/dL (ref 8.4–10.5)
Chloride: 101 mEq/L (ref 96–112)
Creatinine, Ser: 0.61 mg/dL (ref 0.4–1.2)
GFR calc Af Amer: >60 mL/min (ref >60)
GFR calc non Af Amer: >60 mL/min (ref >60)
Glucose, Bld: 130 mg/dL — ABNORMAL HIGH (ref 70–99)
Potassium: 3.7 mEq/L (ref 3.5–5.1)
Sodium: 138 mEq/L (ref 135–145)

## 2010-04-07 LAB — CBC
HCT: 30.9 % — ABNORMAL LOW (ref 36.0–46.0)
Hemoglobin: 10.4 g/dL — ABNORMAL LOW (ref 12.0–15.0)
MCH: 27.6 pg (ref 26.0–34.0)
MCHC: 33.7 g/dL (ref 30.0–36.0)
MCV: 82 fL (ref 78.0–100.0)
Platelets: 317 10*3/uL (ref 150–400)
RBC: 3.77 MIL/uL — ABNORMAL LOW (ref 3.87–5.11)
RDW: 12.4 % (ref 11.5–15.5)
WBC: 7.9 10*3/uL (ref 4.0–10.5)

## 2010-04-07 LAB — POCT I-STAT 4, (NA,K, GLUC, HGB,HCT)
Glucose, Bld: 137 mg/dL — ABNORMAL HIGH (ref 70–99)
Glucose, Bld: 141 mg/dL — ABNORMAL HIGH (ref 70–99)
HCT: 19 % — ABNORMAL LOW (ref 36.0–46.0)
HCT: 26 % — ABNORMAL LOW (ref 36.0–46.0)
Hemoglobin: 6.5 g/dL — CL (ref 12.0–15.0)
Hemoglobin: 8.8 g/dL — ABNORMAL LOW (ref 12.0–15.0)
Potassium: 3.5 mEq/L (ref 3.5–5.1)
Potassium: 3.8 mEq/L (ref 3.5–5.1)
Sodium: 117 mEq/L — CL (ref 135–145)
Sodium: 139 mEq/L (ref 135–145)

## 2010-04-08 LAB — GLUCOSE, CAPILLARY
Glucose-Capillary: 133 mg/dL — ABNORMAL HIGH (ref 70–99)
Glucose-Capillary: 217 mg/dL — ABNORMAL HIGH (ref 70–99)

## 2010-04-08 NOTE — Discharge Summary (Addendum)
[name], [name]               ACCOUNT NO.:  [id number]  MEDICAL RECORD NO.:  [id number]          PATIENT TYPE:  INP  LOCATION:  2011                         FACILITY:  [facility]  PHYSICIAN:  Salvatore Decent. Cornelius Moras, M.D. DATE OF BIRTH:  [birth year]  DATE OF ADMISSION:  03/26/2010 DATE OF DISCHARGE:                              DISCHARGE SUMMARY   FINAL DIAGNOSIS:  Severe three-vessel coronary artery disease.  IN-HOSPITAL DIAGNOSES: 1. Acute blood loss anemia postoperatively. 2. Bilateral 40-59% internal carotid artery stenosis.  SECONDARY DIAGNOSES: 1. Type 2 diabetes mellitus, completely uncontrolled. 2. Hypothyroidism. 3. Hyperlipidemia. 4. Obesity. 5. Likely diabetic neuropathy. 6. Status post appendectomy. 7. Status post tonsillectomy.  IN-HOSPITAL OPERATIONS AND PROCEDURES: 1. Cardiac catheterization done by Dr. Excell Seltzer, March 26, 2010. 2. Coronary artery bypass grafting x3 using left internal mammary     artery to the left anterior descending coronary artery, saphenous     vein graft to second diagonal branch, saphenous vein graft to     second obtuse marginal branch of the left circumflex coronary     artery.  Endoscopic saphenous vein harvest from right thigh and     right lower leg.  This was done by Dr. Cornelius Moras on March 31, 2010.  HISTORY AND PHYSICAL AND HOSPITAL COURSE:  The patient is a 65 year old obese white female with uncontrolled type 2 diabetes and hyperlipidemia. The patient presents with unstable angina.  Cardiac catheterization done on March 26, 2010 demonstrates severe three-vessel coronary artery disease with mild left ventricular dysfunction.  There is very diffuse distal coronary artery disease of target vessels for grafting.  Dr. Cornelius Moras was consulted for further evaluation for coronary artery bypass grafting.  Dr. Riley Kill saw and discussed with the patient undergoing above-mentioned procedure.  He discussed the risks and benefits with the patient.   The patient also understanding agreed to proceed.  Surgery was scheduled for March 31, 2010.  For further details of the patient's past medical history and physical exam, please see dictated H and P. Preoperatively, the patient did have bilateral carotid duplex ultrasound done showing bilateral 40-59% ICA stenosis.  On admission, the patient's hemoglobin A1c was noted to be 13.3.  Nutrition was consulted preoperatively for assistance in diet.  Recommended outpatient diabetes education.  The patient had been started on Lantus insulin and continued.  She did remain stable preoperatively.  The patient was taken to the operating room on March 31, 2010 where she underwent coronary artery bypass grafting x3 using a left internal mammary artery to distal left anterior descending coronary artery, saphenous vein graft to second diagonal branch, saphenous vein graft to second obtuse marginal branch, endoscopic vein harvest from right thigh and right lower leg done.  The patient tolerated this procedure well and was transferred to the Intensive Care Unit in stable condition. Postoperatively, the patient was noted to be hemodynamically stable. She was extubated on the evening of surgery.  Post-extubation, the patient was noted to be alert and oriented x4 and neuro intact.  On postop day #1, the patient was noted to be in normal sinus rhythm. Blood pressure was stable.  All drips were able  to be weaned and discontinued.  Chest x-rays obtained was stable.  She had minimal drainage from chest tubes and chest tubes were discontinued in routine fashion.  She did have some mild acute blood loss anemia and this was monitored.  The patient also had some mild volume overload and she was diuresed.  She was continued on Lantus insulin and sliding scale insulin as well.  She was started on Plavix secondary to aspirin allergy.  The patient was noted to be stable and transferred out to PCT on postop day #1.   On telemetry floor, the patient continued to progress well.  She has remained in normal sinus rhythm.  Blood pressure stable.  She has been started on metoprolol.  Currently, the blood pressure is well controlled on Lopressor.  The patient can be evaluated as an outpatient to start an ACE inhibitor as blood pressure tolerates.  Chest x-ray obtained on postop day #2 remained stable.  The patient was encouraged to use her incentive spirometer and she was able to be weaned off oxygen with O2 saturations maintaining greater than 90%.  Her hemoglobin/hematocrit remained stable.  The patient's blood sugars were followed closely.  She was continued on Lantus insulin.  Diabetes coordinator was consulted and evaluated the patient.  She discussed with the patient importance of controlled blood sugars and following up with an endocrinologist.  The patient and family were in agreement to contact an endocrinologist for further followup.  Diabetes coordinator recommended oral agent as well as Lantus insulin.  At this time, the patient is currently on Lantus insulin and sliding scale insulin.  We will reevaluate with MD prior to discharge regarding diabetes home medication.  During the patient's postoperative course, she was up and ambulating with Cardiac Rehab.  The patient was having orthostatic symptoms with ambulation.  Orthostatic vital signs were checked and monitored.  The patient felt that she was not dehydrated and electrolytes were normal.  She thought that her orthostatic symptoms were related to diabetic neuropathy.  The patient is currently not on any Lasix.  We will continue to monitor during her hospitalization.  The patient was tolerating diet well.  No nausea or vomiting noted.  All incisions are clean, dry, and intact and healing well.  On April 05, 2010, the patient was noted to be afebrile, normal sinus rhythm, and blood pressure stable.  Most recent lab work shows TSH of 0.954.   Magnesium level of 2.2.  Sodium of 139, potassium 3.9, chloride 104, bicarbonate 27, BUN 9, creatinine 0.58, and glucose 107.  White blood cell count of 8.9, hemoglobin of 10.3, hematocrit 30.3, and platelet count 242.  The patient is tentatively ready for discharge to home in the a.m. pending she remained stable.  FOLLOWUP APPOINTMENTS:  A followup appointment is arranged with Dr. Cornelius Moras for April 19, 2010 at 1 o'clock p.m.  The patient will need to obtain PA and lateral chest x-ray 30 minutes prior to this appointment.  The patient will need to follow up with Dr. Antoine Poche on April 19, 2010 at 9:00 a.m.  ACTIVITY:  The patient instructed no driving until released to do so and no lifting over 10 pounds.  She is told to ambulate 3-4 times per day, progress as tolerated, and continue her breathing exercises.  INCISIONAL CARE:  The patient is told to shower washing her incisions using soap and water.  She is to contact the office if she develops any drainage or opening from any of her incision sites.  DIET:  The patient educated on diet to be low-fat, low-salt as well as diabetic diet.  DISCHARGE MEDICATIONS: 1. Plavix 75 mg daily. 2. Robitussin DM 15 mL q.4 h. p.r.n. 3. Lantus insulin 36 units at night. 4. Metoprolol 25 mg b.i.d. 5. Roxicodone 5 mg 1-2 tablets q.4-6 h. p.r.n. pain. 6. Crestor 40 mg daily. 7. Armour Thyroid 60 mg daily. 8. CoQ10 daily. 9. Magnesium daily. 10.Vitamin B12 daily.     Sol Blazing, PA   ______________________________ Salvatore Decent. Cornelius Moras, M.D.    KMD/MEDQ  D:  04/05/2010  T:  04/06/2010  Job:  [id number]  cc:   Rollene Rotunda, MD, [facility]  Electronically Signed by Cameron Proud PA on 04/07/2010 03:59:54 PM Electronically Signed by Tressie Stalker M.D. on 04/08/2010 07:59:37 AM

## 2010-04-09 NOTE — H&P (Signed)
NAMETORIN, WHISNER               ACCOUNT NO.:  [id number]  MEDICAL RECORD NO.:  [id number]          PATIENT TYPE:  INP  LOCATION:  2309                         FACILITY:  [facility]  PHYSICIAN:  Zenon Mayo, MDDATE OF BIRTH:  1945-04-27  DATE OF ADMISSION:  03/26/2010 DATE OF DISCHARGE:                             HISTORY & PHYSICAL   PROCEDURE:  Transesophageal echocardiogram.  INDICATION:  Evaluation of valvular function as well as left ventricular function.  DESCRIPTION:  Ms. Comins is a 65 year old female with a history of coronary artery disease who was brought to the operating room today by Dr. Cornelius Moras for coronary artery bypass grafting.  Intraoperative echocardiogram was requested to evaluate left ventricular function as well as any valvular abnormalities that may be seen.  The patient was taken to the operating room and placed under general anesthesia.  After confirmation of endotracheal tube placement and orogastric suctioning, a transesophageal echo probe was placed into the patient's esophagus without complication.  The left ventricle was imaged first and revealed a left ventricular wall that was mildly thickened.  There were no segmental wall motion abnormalities noted.  The estimated ejection fraction is 45%.  The mitral valve was imaged next.  The valve appeared to coapt well on planimetry.  The anterior leaflet was somewhat thickened with some mild calcifications but did appear to coapt well together.  Analysis with color Doppler revealed trace-to-mild mitral regurgitation.  The aortic valve was imaged next.  This valve was trileaflet in nature and free from both vegetation and calcification. The aortic valve area measured by planimetry was 2.24 cm2.  Further evaluation with color Doppler did not reveal any aortic insufficiency. The pulmonic valve was difficult to visualize with planimetry.  When analyzing with color Doppler, a trace amount of pulmonic  regurgitation was seen.  The tricuspid valve appeared to function normally under planimetry, however, when color was placed across the valve, there was a trace amount of tricuspid insufficiency noted.  The interatrial septum was intact.  The left atrial appendage was free from thrombus and the distal aorta all the way up to the aortic arch was free from atherosclerotic disease.  Upon weaning from bypass, the heart was again imaged.  The left ventricle did have initially some wall motion abnormality.  The anterior wall appeared to squeeze vigorously and there was some hypokinesis of the inferior wall.  This did improve over time to where the left ventricular function was consistent throughout with no wall motion abnormalities seen and was back to its baseline function.  The mitral valve was imaged next.  Initially, the mitral regurgitation appeared to be somewhat worse than it had been prebypass.  The regurgitant jet was more in the moderate nature, initially coming off bypass.  However, this  corrected itself and was eventually back to prebypass values.  All other structures and functions of the heart were remained unchanged from prebypass examination.  The patient was weaned from bypass without difficulty and had a stable intraoperative course after coming off cardiopulmonary bypass.  At the completion of the procedure, the transesophageal echo probe was removed from the patient's esophagus without  any evidence of trauma.  The patient was taken directly from the operating room to the ICU in stable condition.          ______________________________ Zenon Mayo, MD     WEF/MEDQ  D:  03/31/2010  T:  04/01/2010  Job:  [id number]  cc:   Anesthesia Medical illustrator by W. Carron Jaggi MD on 04/07/2010 01:49:53 PM

## 2010-04-12 ENCOUNTER — Encounter: Payer: Self-pay | Admitting: Cardiology

## 2010-04-13 NOTE — Procedures (Signed)
[name], [name]               ACCOUNT NO.:  [id number]  MEDICAL RECORD NO.:  [id number]          PATIENT TYPE:  INP  LOCATION:  2807                         FACILITY:  [facility]  PHYSICIAN:  Veverly Fells. Excell Seltzer, MD  DATE OF BIRTH:  May 13, 1945  DATE OF PROCEDURE:  03/26/2010 DATE OF DISCHARGE:                           CARDIAC CATHETERIZATION   PROCEDURE: 1. Left heart catheterization. 2. Selective coronary angiography. 3. Left ventricular angiography.  PROCEDURAL INDICATIONS:  [name] is a 65 year old woman with uncontrolled type 2 diabetes who presented to the office today with symptoms of unstable angina.  Dr. Antoine Poche evaluated her and referred her directly for cardiac cath in the setting of her worrisome symptoms.  Risks and indications of the procedure were reviewed with the patient. Informed consent was obtained.  The right wrist was prepped, draped, and anesthetized with 1% lidocaine.  Using modified Seldinger technique, a 5- French sheath was placed in the right radial artery.  4000 units of unfractionated heparin was given, 3 mg of verapamil was administered through the sheath.  Standard Judkins catheters were used for coronary angiography.  The left coronary artery was difficult to intubate and was ultimately imaged selectively with an EBU 3.0 guide catheter.  A JR-4 was used for the right coronary artery and a pigtail catheter was used for left ventriculography.  Catheter exchanges were performed over an exchange length wire.  The patient tolerated the procedure well.  There were no immediate complications.  FINDINGS:  Aortic pressure is 138/60 with a mean of 93, left ventricular pressure is 137/20.  Left ventriculography:  There is mild hypokinesis of the left ventricular apex.  The remaining LV wall segments contract normally. The estimated ejection fraction is 50%.  Coronary angiography:  Left mainstem has distal 30% stenosis as it divides into the LAD and  left circumflex.  LAD:  The LAD has severe diffuse disease.  The proximal LAD has a 90% stenosis involving the origin of both the first and second diagonal branches.  The mid-LAD after a large septal perforator has a long segment of severe 90-95% stenoses.  The two major diagonal branches have severe 90% ostial stenoses and they are both moderate in caliber.  The distal and apical portions of the LAD have diffuse disease, but there are no critical lesions present.  Left circumflex:  The left circumflex is dominant.  There is 50% ostial stenosis present.  The mid vessel has an 80% stenosis.  There are four obtuse marginal branches, all of which are subtotally occluded.  The higher OM branches appear extremely small in the third and fourth Oms, both fill late, but appear to supply a larger myocardial territory.  The left PDA arises from the circumflex and is also diffusely diseased with 70-80% stenoses.  ASSESSMENT: 1. Severe three-vessel coronary artery disease in a diffuse pattern     consistent with advanced diabetic coronary disease. 2. Mild segmental left ventricular systolic dysfunction.  RECOMMENDATIONS:  The patient will be continued on intravenous heparin. We will obtain a cardiac surgery consult.  I discussed the patient's case with Dr. Cornelius Moras who will see her in consultation.  Unfortunately, her distal targets are poor, but I suspect coronary bypass will be the only viable option for treatment.     Veverly Fells. Excell Seltzer, MD     MDC/MEDQ  D:  03/26/2010  T:  03/27/2010  Job:  [id number]  Electronically Signed by Tonny Bollman MD on 04/13/2010 04:58:19 AM

## 2010-04-14 NOTE — Op Note (Signed)
  NAMEMadelyn [name]           ACCOUNT NO.:  [id number]  MEDICAL RECORD NO.:  [id number]          PATIENT TYPE:  LOCATION:                                 FACILITY:  PHYSICIAN:  Conley Simmonds, D.D.S.    DATE OF BIRTH:  DATE OF PROCEDURE:  03/30/2010 DATE OF DISCHARGE:                              OPERATIVE REPORT   SURGEON:  Conley Simmonds, D.D.S.  ASSISTANT:  Judithann Sauger and Meda Klinefelter.  DATE OF OPERATION:  March 30, 2010.  TYPE OF OPERATION:  Restorative dentistry.  PROCEDURE IN DETAIL:  The patient was brought to the operating room. Anesthesia was begun using nasotracheal intubation.  The eyes were taped shut and padded with ointment through the entire procedure.  Any x-rays involved the use of a lead apron covering the child's neck and torso. Rubber dam was used when practical, and a throat pack was in place for the entire procedure.  Complete oral examination and dental prophylaxis were performed.  At the end of the procedure, a fluoride treatment using fluoride varnish was used.  Full set of dental x-rays were taken.  They were developed in the operating room and visualized in the operating room.  The findings were consistent with the clinical findings.  Also, one postoperative x-ray was taken to confirm the success of the root canal treatment.  The following teeth were dealt within the following manner:  Tooth B, occlusal composite restoration.  Tooth D, facial composite restoration.  Tooth E, facial composite restoration.  Tooth F, lingual composite restoration; this tooth also received a complete endodontic restoration using zinc oxide eugenol that filled the canal. Tooth G, facial and lingual composite restorations with base.  Tooth L, occlusal composite restoration with base.  Tooth S, occlusal composite restoration with base.  All teeth had base, and in all teeth, the base was dycal.  At the end of the procedure, the oropharyngeal area  was thoroughly evacuated when no debris remained.  Throat pack was removed and the child taken to the recovery room in good condition, with minimal or no blood loss from the procedure.  The mother and grandmother received a complete set of written and verbal postoperative instructions.  A prescription for amoxicillin 250 mg/5 mL, dispense 150 mL, 2 teaspoons immediately, then 2 teaspoons 8 hours later, then 1 teaspoon every 8 hours for 7 days was given to cover any infection from the root canal treatment.  The justification for the use of general anesthesia was this child's extremely young age and the extreme amount of dentistry needed to be performed and his inability to cooperate with treatment in the routine dental office setting.     Conley Simmonds, D.D.S.    EMM/MEDQ  D:  03/31/2010  T:  04/01/2010  Job:  [id number]  Electronically Signed by Berton Bon D.D.S. on 04/14/2010 12:46:06 PM

## 2010-04-15 NOTE — Letter (Signed)
Summary: ER Notification  Architectural technologist, Main Office  [address]   [place], [place] [address]   Phone: [phone]  Fax: [phone]    March 26, 2010 9:35 AM  Cherri Marut  The above referenced patient has been advised to report directly to the Emergency Room. Please see below for more information:  Dx:    Chest Pain   Private Vehicle  _____x__________ or EMS:  ________________   Orders:  Yes ______ or No  ___x____   Susy Frizzle, PA to do   Notify upon arrival:     Trish [phone]       Or _________________   Thank you,   Dr Caryl [facility] Staff

## 2010-04-15 NOTE — Assessment & Plan Note (Signed)
Summary: np6. chestpain. 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   Visit Type:  Follow-up Primary Provider:  Jerel Shepherd PAc  CC:  chest pain.  History of Present Illness: The patient is a pleasant 65 year old white female without prior cardiac history. She reports that she's had diabetes for some year but apparently has not sought medical management of this. She does report that recently he hemoglobin A1c was 13. She said that in September while on a [place] cruise she had a very severe episode of substernal chest pressure. There was radiation to her left arm. She felt very weak and fatigued all in this. The episode lasted for 45 minutes and she did not seek medical attention. She subsequently has felt very weak.  She has had dyspnea with exertion such as walking up a small incline. She has had intermittent chest discomfort similar to previous though this is not necessarily reproducible with activity. She had an episode this morning lasting about 45 minutes. This one was 6/10 without associated symptoms. She has had no PND or orthopnea. She has had no palpitations, presyncope or syncope. She has had no weight gain or edema. 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  Current Medications (verified): 1)  Spironolactone 25 Mg Tabs (Spironolactone) .... As Needed 2)  Armour Thyroid 60 Mg Tabs (Thyroid) .Marland Kitchen.. 1 By Mouth Daily  Allergies (verified): 1)  ! Asa 2)  ! Demerol  Past History:  Past Medical History: Diabetes x (last A1c 14) Hypothyroidism  Past Surgical History: Appendectomy Tonsilectomy  Family History: Her father died suddenly of a myocardial infarction at the age 64. He had not had heart disease prior to this. Her mother died at 3 of esophageal cancer.  Social History: She is married. She has 3 children. She is self-employed. She has never smoked cigarettes.  Review of  Systems       As stated in the HPI and negative for all other systems.   Vital Signs:  Patient profile:   65 year old female Height:      66 inches Weight:      182 pounds BMI:     29.48 Pulse rate:   92 / minute Resp:     16 per minute BP sitting:   118 / 88  (right arm)  Vitals Entered By: Marrion Coy, CNA (March 26, 2010 8:33 AM)  Physical Exam  General:  Well developed, well nourished, in no acute distress. Head:  normocephalic and atraumatic Eyes:  PERRLA/EOM intact; conjunctiva and lids normal. Mouth:  Teeth, gums and palate normal. Oral mucosa normal. Neck:  Neck supple, no JVD. No masses, thyromegaly or abnormal cervical nodes. Chest Wall:  no deformities Lungs:  Clear bilaterally to auscultation and percussion. Abdomen:  Bowel sounds positive; abdomen soft and non-tender without masses, organomegaly, or hernias noted. No hepatosplenomegaly. Msk:  Back normal, normal gait. Muscle strength and tone normal. Extremities:  There is a small ulcer on her left great toe, otherwise unremarkable Neurologic:  Alert and oriented x 3. Skin:  Intact without lesions or rashes. Cervical Nodes:  no significant adenopathy Axillary Nodes:  no significant adenopathy Inguinal Nodes:  no significant adenopathy Psych:  Normal affect.   Detailed Cardiovascular Exam  Neck    Carotids: Carotids full and equal bilaterally without bruits.      Neck Veins: Normal, no JVD.    Heart    Inspection: no deformities or lifts noted.  Palpation: normal PMI with no thrills palpable.      Auscultation: regular rate and rhythm, S1, S2 without murmurs, rubs, gallops, or clicks.    Vascular    Abdominal Aorta: no palpable masses, pulsations, or audible bruits.      Femoral Pulses: normal femoral pulses bilaterally.      Pedal Pulses: normal pedal pulses bilaterally.      Radial Pulses: normal radial pulses bilaterally.      Peripheral Circulation: no clubbing, cyanosis, or edema noted with  normal capillary refill.     EKG  Procedure date:  03/26/2010  Findings:      Sinus rhythm, rate 92, left axis deviation, poor anterior R-wave progression, borderline QTC prolongation  Impression & Recommendations:  Problem # 1:  CHEST PAIN (ICD-786.50) Her chest pain is very worrisome for unstable angina. She obtained today. She has a very high pretest probability of obstructive coronary disease and so will have cardiac catheterization. I have discussed the risks and benefits. She has a true aspirin allergy so this should be avoided. She will need aggressive risk reduction regardless of what we find. She will need a fasting lipid profile and empiric statins.  Problem # 2:  DM (ICD-250.00) She will need medical management of this including sliding scale insulin. She will need education and followup.  Problem # 3:  HYPOTHYROIDISM (ICD-244.9) We will followup her thyroid profile as some of her symptoms could be related.

## 2010-04-16 ENCOUNTER — Other Ambulatory Visit: Payer: Self-pay | Admitting: Thoracic Surgery (Cardiothoracic Vascular Surgery)

## 2010-04-16 ENCOUNTER — Telehealth: Payer: Self-pay | Admitting: Cardiology

## 2010-04-16 DIAGNOSIS — I2510 Atherosclerotic heart disease of native coronary artery without angina pectoris: Secondary | ICD-10-CM

## 2010-04-16 NOTE — Discharge Summary (Signed)
  [name], [name]               ACCOUNT NO.:  [id number]  MEDICAL RECORD NO.:  [id number]          PATIENT TYPE:  INP  LOCATION:  2011                         FACILITY:  [facility]  PHYSICIAN:  Salvatore Decent. Cornelius Moras, M.D. DATE OF BIRTH:  Oct 25, 1945  DATE OF ADMISSION:  03/26/2010 DATE OF DISCHARGE:  04/08/2010                              DISCHARGE SUMMARY   ADDENDUM.  Ms. Grasse was seen and evaluated on morning rounds on April 08, 2010. She continues to have some orthostatic hypotension; however, her blood pressures have improved after administration of IV fluids.  She continues to have some episodic dizziness but this has also somewhat improved.  Dr. Cornelius Moras has spoken with Dr. Excell Seltzer regarding a trial of midodrine and we will plan to initiate 5 mg p.o. t.i.d. starting today. She has otherwise remained stable.  She has had no further low-grade fevers, and her urinalysis was negative with a normal white blood cell count on CBC at 7.9.  She has otherwise remained stable since the previous dictation and after morning round evaluation by Dr. Cornelius Moras has been deemed ready for discharge home today.  Discharge instructions and followups are unchanged from the previously dictated discharge summary.  Updated list of discharge medications are as follows: 1. Plavix 75 mg daily. 2. Lantus 36 units at bedtime. 3. Metoprolol 25 mg b.i.d. 4. Midodrine 5 mg t.i.d. x2 weeks. 5. Oxycodone IR 5-10 mg q.4-6 h. p.r.n. for pain. 6. Crestor 40 mg daily. 7. Armour Thyroid 60 mg daily. 8. CoQ10 daily. 9. Magnesium 1 tablet daily. 10.Vitamin B12 one tablet daily.     Coral Ceo, P.A.   ______________________________ Salvatore Decent. Cornelius Moras, M.D.    GC/MEDQ  D:  04/08/2010  T:  04/08/2010  Job:  [id number]  cc:   Rollene Rotunda, MD, [facility] TCTS Office.  Electronically Signed by Weldon Inches. on 04/15/2010 01:06:11 PM Electronically Signed by Tressie Stalker M.D. on 04/16/2010 09:59:51 AM

## 2010-04-16 NOTE — Discharge Summary (Signed)
  NAMERENNIE, HACK               ACCOUNT NO.:  [id number]  MEDICAL RECORD NO.:  [id number]          PATIENT TYPE:  INP  LOCATION:  2011                         FACILITY:  [facility]  PHYSICIAN:  Salvatore Decent. Cornelius Moras, M.D. DATE OF BIRTH:  April 17, 1945  DATE OF ADMISSION:  03/26/2010 DATE OF DISCHARGE:                              DISCHARGE SUMMARY   ADDENDUM:  [name] was kept in the hospital for additional observation secondary to orthostatic hypotension.  She initially was symptomatic with some dizziness and Dr. Cornelius Moras felt that her symptoms were related to dehydration and poor p.o. intake.  She was observed closely and was treated with hydration and observation of her status.  Her blood pressures did slowly began to improve.  By postop day #7, she was not experiencing any further dizziness and her blood pressures were running between 80 and 120 systolic.  She otherwise has remained stable.  She did have one isolated low grade fever of 100.7 on postop day #6, which resolved without treatment.  CBC showed no evidence of leukocytosis. She is otherwise remained stable and is progressing well postoperatively.  Other labs on April 07, 2010 showed a sodium of 138, potassium 3.7, BUN 6, creatinine 0.61.  White cell count 7.9, hemoglobin 10.4, hematocrit 30.9, and platelets 317.  We will continue to observe her status over the next 24 hours and her blood pressures continued to improve and she has no further fevers.  She will hopefully be ready for discharge home on April 08, 2010.  Of note, the patient has arranged for outpatient follow up with Dr. Hyacinth Meeker an endocrinologist in [place] to follow up her diabetes.  Discharge medications, followup, and discharge instructions are unchanged from the previously dictated discharge summary.     [name], P.A.   ______________________________ Salvatore Decent. Cornelius Moras, M.D.    GC/MEDQ  D:  04/07/2010  T:  04/08/2010  Job:  [id number]  cc:   TCTS  Office Rollene Rotunda, MD, [facility]  Electronically Signed by Weldon Inches. on 04/15/2010 01:05:50 PM Electronically Signed by Tressie Stalker M.D. on 04/16/2010 09:59:45 AM

## 2010-04-19 ENCOUNTER — Encounter: Payer: Self-pay | Admitting: Cardiovascular Disease

## 2010-04-19 ENCOUNTER — Encounter: Payer: Self-pay | Admitting: Physician Assistant

## 2010-04-19 ENCOUNTER — Other Ambulatory Visit: Payer: Medicare Other

## 2010-04-19 ENCOUNTER — Other Ambulatory Visit: Payer: Self-pay | Admitting: Physician Assistant

## 2010-04-19 ENCOUNTER — Ambulatory Visit
Admission: RE | Admit: 2010-04-19 | Discharge: 2010-04-19 | Disposition: A | Discharge status: Home / Self Care | Payer: Medicare Other | Source: Ambulatory Visit | Attending: Thoracic Surgery (Cardiothoracic Vascular Surgery) | Admitting: Thoracic Surgery (Cardiothoracic Vascular Surgery)

## 2010-04-19 ENCOUNTER — Encounter (INDEPENDENT_AMBULATORY_CARE_PROVIDER_SITE_OTHER): Payer: Medicare Other | Admitting: Thoracic Surgery (Cardiothoracic Vascular Surgery)

## 2010-04-19 ENCOUNTER — Ambulatory Visit (INDEPENDENT_AMBULATORY_CARE_PROVIDER_SITE_OTHER): Payer: Medicare Other | Admitting: Physician Assistant

## 2010-04-19 DIAGNOSIS — E785 Hyperlipidemia, unspecified: Secondary | ICD-10-CM | POA: Insufficient documentation

## 2010-04-19 DIAGNOSIS — I6529 Occlusion and stenosis of unspecified carotid artery: Secondary | ICD-10-CM | POA: Insufficient documentation

## 2010-04-19 DIAGNOSIS — I2510 Atherosclerotic heart disease of native coronary artery without angina pectoris: Secondary | ICD-10-CM

## 2010-04-19 DIAGNOSIS — I951 Orthostatic hypotension: Secondary | ICD-10-CM | POA: Insufficient documentation

## 2010-04-19 DIAGNOSIS — Z951 Presence of aortocoronary bypass graft: Secondary | ICD-10-CM | POA: Insufficient documentation

## 2010-04-19 LAB — BASIC METABOLIC PANEL
BUN: 16 mg/dL (ref 6–23)
CO2: 28 mEq/L (ref 19–32)
Calcium: 9.1 mg/dL (ref 8.4–10.5)
Chloride: 102 mEq/L (ref 96–112)
Creatinine, Ser: 0.5 mg/dL (ref 0.4–1.2)
GFR: 127.98 mL/min (ref >60.00)
Glucose, Bld: 110 mg/dL — ABNORMAL HIGH (ref 70–99)
Potassium: 4.3 mEq/L (ref 3.5–5.1)
Sodium: 140 mEq/L (ref 135–145)

## 2010-04-21 ENCOUNTER — Telehealth: Payer: Self-pay | Admitting: Cardiology

## 2010-04-21 NOTE — Progress Notes (Signed)
Summary: rx request  treatment for gout  Phone Note Call from Patient   Caller: dtr [phone] rhonda Reason for Call: Talk to Nurse Summary of Call: pt was seen in hospital dtr said dr h treated her for gout, she has it again and she's requesting an rx [name] [place] [address] Initial call taken by: Glynda Jaeger,  April 16, 2010 11:30 AM  Follow-up for Phone Call        OK to prescribe Colcrys 1.2 mg by mouth x one then 0.6 mg by mouth 1 hour later then 0.6 mg by mouth two times a day. Follow-up by: Rollene Rotunda, MD, [facility],  April 16, 2010 1:29 PM    New/Updated Medications: COLCRYS 0.6 MG TABS (COLCHICINE) take 2 by mouth now and 1 one hour later,  then take one two times a day thereafter Prescriptions: COLCRYS 0.6 MG TABS (COLCHICINE) take 2 by mouth now and 1 one hour later,  then take one two times a day thereafter  #60 x 3   Entered by:   Charolotte Capuchin, RN   Authorized by:   Rollene Rotunda, MD, [facility]   Signed by:   Charolotte Capuchin, RN on 04/16/2010   Method used:   [facility]        [facility] [phone]* (retail)       [address] [place], [place]  [address]       Ph: [phone] or [phone]       Fax: [phone]   RxID:   [id number]

## 2010-04-23 NOTE — Assessment & Plan Note (Signed)
OFFICE VISIT  NAWAAL, ALLING DOB:  05/30/1945                                        April 19, 2010 CHART #:  [id number]  HISTORY OF PRESENT ILLNESS:  The patient returns for followup status post coronary artery bypass grafting x3 on March 31, 2010.  Her early postoperative convalescence was overall uncomplicated although notable for the fact that she developed severe orthostatic hypotension.  This persisted even despite IV hydration and elimination of all diuretic therapy and persisted well beyond to the point where it was clear that she was euvolemic.  It became clear that she suffered from orthostatic hypotension most likely related to autonomic neuropathy due to her uncontrolled diabetes.  Eventually, she was started on midodrine and she enjoyed some symptomatic improvement.  Since hospital discharge, she has continued to do reasonably well, although she continues to suffer from some orthostatic hypotension.  Usually she takes her time within a period of few minutes her blood pressure stabilizes and transient dizziness resolve.  Since hospital discharge, she has continued to gradually improve overall.  Otherwise, she has felt fine.  She has never had any problems with pain.  She has not had any shortness of breath and in fact she states that her breathing is much better than it was prior to surgery.  Her appetite is good.  She has done remarkably well with glycemic control so far after surgery and she is scheduled to see Dr. Silver Huguenin for initial consultation to assume long-term management of her diabetes care.  She was seen earlier today in followup by Dr. Antoine Poche and her dose of metoprolol was cut back to see if this will help with her blood pressure management.  The remainder of her review of systems is completely unremarkable and overall she is delighted with her progress.  PHYSICAL EXAMINATION:  GENERAL:  Notable for well-appearing  female. VITAL SIGNS:  Blood pressure 91/57, pulse 103, and oxygen saturation 96% on room air.  CHEST:  A median sternotomy incision that is healing nicely.  The sternum is stable on palpation.  Breath sounds are clear to auscultation and symmetrical bilaterally.  Chest tube incisions have almost healed, although there are areas where the skin is separated from the chest tube incisions.  These are clean and healing appropriately. EXTREMITIES:  The small incision from endoscopic vein harvest is healing nicely in the right lower extremity.  There is no lower extremity edema. LUNGS:  Auscultation reveals clear breath sounds that are symmetrical bilaterally.  No wheezes, rales, or rhonchi are noted.  CARDIOVASCULAR: Regular rate and rhythm.  No murmurs, rubs, or gallops are appreciated. ABDOMEN:  Soft and nontender.  The remainder of physical exam is unremarkable.  DIAGNOSTIC TESTS:  Chest [facility] performed [facility] at [facility] is reviewed.  This demonstrates clear lung fields bilaterally. There are no significant pleural effusions.  All the sternal wires appear intact.  IMPRESSION:  Satisfactory progress following recent coronary artery bypass grafting.  The patient still has symptomatic orthostatic hypotension related to presumably to diabetic neuropathy.  This has been stable and perhaps slightly improving on midodrine therapy.  Her dose of metoprolol has been cut back today by Dr. Antoine Poche to address her marginal blood pressure.  Otherwise, she seems to be getting along quite well.  PLAN:  I have encouraged the patient to continue to  gradually increase her physical activity as tolerated with her limitations primarily at this point related to her orthostatic hypotension and whether or not she gets dizzy when she gets up.  I have reminded her to avoid any sort of heavy lifting or strenuous use of her arms or shoulders.  Once her blood pressure stabilizes, I would  encourage her to get involved in the cardiac rehab program, although I suspect it may be several weeks before she should be ready for this and is probably best that she stick with the home health therapy for physical therapy for the time being. Certainly she should not be driving an automobile until her dizziness has resolved.  We will plan to see her back in 2 months for further followup.  She will call or return to see [place] sooner should any further problems or difficulties arise.  Salvatore Decent. Cornelius Moras, M.D. Electronically Signed  CHO/MEDQ  D:  04/19/2010  T:  04/20/2010  Job:  [id number]  cc:   Rollene Rotunda, MD, [facility]. Excell Seltzer, MD Silver Huguenin, MD

## 2010-04-27 ENCOUNTER — Telehealth: Payer: Self-pay | Admitting: Cardiology

## 2010-04-29 ENCOUNTER — Telehealth: Payer: Self-pay | Admitting: Cardiology

## 2010-04-29 NOTE — Progress Notes (Signed)
Summary: refill  Phone Note Refill Request Call back at Home Phone [phone] Message from:  Patient on April 21, 2010 11:13 AM  Refills Requested: Medication #1:  MIDODRINE HCL 5 MG TABS Take 1 tablet by mouth three times a day. [facility] [address]. pt would like a generic if ther is one, pt questions about the compression stockings   Initial call taken by: Judie Grieve,  April 21, 2010 11:13 AM  Follow-up for Phone Call        pt calling to say [facility]'t [facility] the [facility]ill Glynda Jaeger  April 21, 2010 4:27 PM     Prescriptions: MIDODRINE HCL 5 MG TABS (MIDODRINE HCL) Take 1 tablet by mouth three times a day  #90 x 6   Entered by:   Caralee Ates CMA   Authorized by:   Rollene Rotunda, MD, [facility]   Signed by:   Caralee Ates CMA on 04/22/2010   Method used:   [facility]ronically to        [facility] [phone]* (retail)       [address] [place], [place]  [address]       Ph: [phone] or [phone]       Fax: [phone]   RxID:   [id number] MIDODRINE HCL 5 MG TABS (MIDODRINE HCL) Take 1 tablet by mouth three times a day  #90 x 6   Entered by:   Hardin Negus, RMA   Authorized by:   Rollene Rotunda, MD, [facility]   Signed by:   Hardin Negus, RMA on 04/21/2010   Method used:   [facility]ronically to        [facility] [phone]* (retail)       [address] [place], [place]  [address]       Ph: [phone] or [phone]       Fax: [phone]   RxID:   [id number]

## 2010-04-29 NOTE — Consult Note (Signed)
Summary: [facility] Consultation Report  [facility] Consultation Report   Imported By: Earl Many 04/21/2010 16:50:41  _____________________________________________________________________  External Attachment:    Type:   Image     Comment:   External Document

## 2010-04-29 NOTE — Assessment & Plan Note (Signed)
Summary: eph. gd   Visit Type:  Follow-up Primary Provider:  Jerel Shepherd PAc  CC:  no complaints.  History of Present Illness: Primary Cardiologist:  Dr. Rollene Rotunda  [name] is a 65 year old female who was admitted from the office January 13 with symptoms consistent with unstable angina.  Cardiac catheterization demonstrated three-vessel CAD and an ejection fraction of 50%.  She went for CABG and her grafts included a L-LAD, S-Dx, S-OM.  She developed mild postoperative blood loss anemia.  She was placed on Plavix secondary to an aspirin allergy.  Preop carotid Dopplers demonstrated a 40-59% ICA stenosis bilaterally.  Her hospitalization was prolonged secondary to symptomatic orthostatic hypotension.  She was eventually placed on midodrine 5 mg 3 times a day.  She presents for followup.  Overall, she is doing well.  She denies chest pain or shortness of breath.  She still feels lightheaded when she stands up.  Overall, this is improved.  She did try to take a higher dose of midodrine but developed nausea with this.  She is having trouble for in her medications.  She did have an episode of gout recently.  However, this is resolved.   Current Medications (verified): 1)  Armour Thyroid 60 Mg Tabs (Thyroid) .Marland Kitchen.. 1 By Mouth Daily 2)  Metoprolol Tartrate 25 Mg Tabs (Metoprolol Tartrate) .... Take One Tablet By Mouth Twice A Day 3)  Lantus Solostar 100 Unit/ml Soln (Insulin Glargine) .... As Directed 4)  Plavix 75 Mg Tabs (Clopidogrel Bisulfate) .... Take One Tablet By Mouth Daily 5)  Crestor 40 Mg Tabs (Rosuvastatin Calcium) .... Take One Tablet By Mouth Daily. 6)  Midodrine Hcl 5 Mg Tabs (Midodrine Hcl) .... Take 1 Tablet By Mouth Three Times A Day  Allergies (verified): 1)  ! Asa 2)  ! Demerol  Past History:  Past Medical History: CAD    a. status post CABG January 2012 Ejection fraction 50% cardiac catheterization 03/2010 Orthostatic hypotension-Midodrine  therapy carotid  Dopplers January 2012: 40-59% bilateral ICA stenosis Diabetes x (last A1c 14) Hypothyroidism  Review of Systems       As per  the HPI.  All other systems reviewed and negative.   Vital Signs:  Patient profile:   65 year old female Height:      66 inches Weight:      169 pounds BMI:     27.38 Pulse rate:   86 / minute Pulse (ortho):   98 / minute Pulse rhythm:   irregular BP sitting:   96 / 52  (left arm) BP standing:   85 / 62 Cuff size:   regular  Vitals Entered By: Hardin Negus, RMA (April 19, 2010 9:15 AM)  Serial Vital Signs/Assessments:  Time      Position  BP       Pulse  Resp  Temp     By 10:09 AM  Standing  85/62    98                    Danielle Rankin, CMA  Comments: 10:09 AM dizzy By: Danielle Rankin, CMA    Physical Exam  General:  Well nourished, well developed, in no acute distress HEENT: normal Neck: no JVD at 90 Cardiac:  normal S1, S2; RRR; no murmur Lungs:  clear to auscultation bilaterally, no wheezing, rhonchi or rales Abd: soft, nontender, no hepatomegaly Ext: no edema Chest: Median sternotomy scar healing well without erythema or discharge Skin: warm and dry Neuro:  CNs 2-12 intact, no focal abnormalities noted     EKG  Procedure date:  04/19/2010  Findings:      normal sinus rhythm Heart rate 86 Left axis deviation T-wave inversions in leads 1, aVL Low-voltage Poor R-wave progression  Impression & Recommendations:  Problem # 1:  POSTSURGICAL AORTOCORONARY BYPASS STATUS (ICD-V45.81) She is doing well overall post bypass.  She sees Dr. Cornelius Moras this afternoon.    Problem # 2:  CAD (ICD-414.00) She will continue on Plavix instead of aspirin and statin therapy.  She will followup with Dr. Antoine Poche in 4 weeks.  She is planning to pursue cardiac rehabilitation.  I will enroll her in Plavix assistance as she is having difficulty affording her medications.  Problem # 3:  ORTHOSTATIC HYPOTENSION (ICD-458.0)  She tried taking a higher  dose of Midodrine but was intolerant to this.  She will be given a prescription for compression stockings.  I will also decrease her metoprolol to a half a tablet twice a day.  I will check a basic metabolic panel today.  Orders: TLB-BMP (Basic Metabolic Panel-BMET) (80048-METABOL)  Problem # 4:  HYPERLIPIDEMIA (ICD-272.4) She is having trouble affording her medications.  I will change her pravastatin 40 mg q.h.s.  She will have lipids and LFTs drawn in 6-8 weeks after the this change.  Problem # 5:  DM (ICD-250.00) She is having difficulty affording the Lantus.  She sees an endocrinologist next week.  Have asked her to discuss this with the endocrinologist.  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  Other Orders: EKG w/ Interpretation (93000)  Patient Instructions: 1)  Your physician has recommended you make the following change in your medication:  2)  Decrease metoprolol to 1/2 tablet two times a day. 3)  Finish your current prescription for Crestor.  Then, start taking Pravastatin 40 mg at bedtime. 4)  Your physician recommends that you return for a FASTING lipid profile and LFTs 6 weeks after starting on the Pravastatin 40 mg at bedtime. 5)  Your physician recommends that you return for lab work in: today BMET 414.01 6)  Your physician recommends that you schedule a follow-up appointment in:  05/18/10 @ 9:15 with Dr. Antoine Poche 7)  You have been given a prescription for Compression Stockings for Orthostatic Hypotension.

## 2010-04-30 ENCOUNTER — Telehealth: Payer: Self-pay | Admitting: Cardiology

## 2010-05-03 ENCOUNTER — Telehealth: Payer: Self-pay | Admitting: Cardiology

## 2010-05-05 ENCOUNTER — Telehealth: Payer: Self-pay | Admitting: Cardiology

## 2010-05-05 NOTE — Progress Notes (Signed)
Summary: blood pressure   Phone Note Call from Patient Call back at [phone]   Caller: [facility]/ [facility] Reason for Call: Talk to Nurse Summary of Call: pt blood pressure 66/49 this morning. no chest pain no sob.  Initial call taken by: Roe Coombs,  April 27, 2010 8:20 AM  Follow-up for Phone Call        really orthostatic today, in bed, not wearing compression hose because of gout.  Instructed Larita Fife to have pt increase fluid intake, wear compression stockings and hold metoprolol today.  She will re-evaluate 2/20 (on hold next visit) and call us back. Will forward to Dr Antoine Poche for his review Follow-up by: Charolotte Capuchin, RN,  April 27, 2010 8:39 AM

## 2010-05-05 NOTE — Progress Notes (Signed)
Summary: [facility]: Office Visit  [facility]: Office Visit   Imported By: Earl Many 04/27/2010 15:33:06  _____________________________________________________________________  External Attachment:    Type:   Image     Comment:   External Document

## 2010-05-05 NOTE — Progress Notes (Addendum)
Summary: [facility] req additional PT  Phone Note From Other Clinic   Caller: [facility] [phone] or [phone] lynn collins Summary of Call: lynn requesting an addtional 3 weeks of in home pt Initial call taken by: Glynda Jaeger,  April 30, 2010 11:21 AM  Follow-up for Phone Call        I talked with Larita Fife--  Larita Fife is requesting additional 3 weeks of home PT-will forward to Dr Antoine Poche for review     Appended Document: [facility] req additional PT OK to have additional PT  Appended Document: [facility] req additional PT Larita Fife aware of order to continue PT

## 2010-05-05 NOTE — Progress Notes (Addendum)
Summary: pt's dtr calling re gout/side effect from med?  Phone Note Call from Patient   Caller: Bjorn Loser dtr [phone] Reason for Call: Talk to Nurse Summary of Call: calling re pt having gout could this be from plavix? Initial call taken by: Glynda Jaeger,  April 29, 2010 2:43 PM  Follow-up for Phone Call        left message for pt 's daughter that none of the medication we have listed for the pt would increase her risk of gout.  Reviewed the causes of gout and went over foods to avoid.  Encoaraged daughter to try otc treatments like ibuprofen and 2 servings of cherries or cherry juice daily.  Asked daughter to call back if no improvment. Follow-up by: Charolotte Capuchin, RN,  April 29, 2010 4:57 PM

## 2010-05-06 ENCOUNTER — Telehealth: Payer: Self-pay | Admitting: Cardiology

## 2010-05-07 ENCOUNTER — Telehealth (INDEPENDENT_AMBULATORY_CARE_PROVIDER_SITE_OTHER): Payer: Self-pay | Admitting: *Deleted

## 2010-05-11 NOTE — Progress Notes (Signed)
Summary: ? ABOUT PLAVIX   has assistance form will fill out  Phone Note Call from Patient Call back at Home Phone [phone]   Caller: Daughter/ [name] Summary of Call: QUESTION ABOUT GENERIC BRAND FOR PLAVIX PT IS NEEDING A REFILL Initial call taken by: Judie Grieve,  May 05, 2010 10:10 AM  Follow-up for Phone Call        Spoke with patient-requests a cheaper medication than Plavix.  It is over $200.  Will notifiy Dr. Antoine Poche and get back with patient.l Follow-up by: Dessie Coma  LPN,  May 05, 2010 10:15 AM  Additional Follow-up for Phone Call Additional follow up Details #1::        LMTCB  pt rtn call  Omer Jack  May 05, 2010 1:18 PM  Additional Follow-up by: Lisabeth Devoid RN,  May 05, 2010 1:11 PM    Additional Follow-up for Phone Call Additional follow up Details #2::    Pt has plavix assistance form at home and will bring in when complete. Follow-up by: Lisabeth Devoid RN,  May 05, 2010 2:14 PM

## 2010-05-11 NOTE — Progress Notes (Signed)
Summary: refill request/two different pharmacies  RX plavix  pravastatin  Phone Note Refill Request Message from:  Patient on May 06, 2010 10:27 AM  Refills Requested: Medication #1:  PLAVIX 75 MG TABS Take one tablet by mouth daily pravastatin [facility] in [place]/plavix needs written faxed to [phone] [place] [facility] online   Method Requested: Fax to Local Pharmacy Initial call taken by: Glynda Jaeger,  May 06, 2010 10:29 AM  Follow-up for Phone Call        pt's dtr calling re getting refills asap Glynda Jaeger  May 06, 2010 1:40 PM    New/Updated Medications: PRAVASTATIN SODIUM 40 MG TABS (PRAVASTATIN SODIUM) 1 by mouth daily Prescriptions: PLAVIX 75 MG TABS (CLOPIDOGREL BISULFATE) Take one tablet by mouth daily  #90 x 3   Entered by:   Charolotte Capuchin, RN   Authorized by:   Rollene Rotunda, MD, [facility]   Signed by:   Charolotte Capuchin, RN on 05/06/2010   Method used:   Print then Give to Patient   RxID:   [id number] PRAVASTATIN SODIUM 40 MG TABS (PRAVASTATIN SODIUM) 1 by mouth daily  #30 x 8   Entered by:   Marrion Coy, CNA   Authorized by:   Rollene Rotunda, MD, [facility]   Signed by:   Marrion Coy, CNA on 05/06/2010   Method used:   Electronically to        [facility]  [address] (retail)       [address]       [place], [place]  [address]       Ph: [phone]       Fax: [phone]   RxID:   [id number]

## 2010-05-11 NOTE — Progress Notes (Signed)
  Cardiac Records faxed to [facility] @ [phone] [facility]  May 07, 2010 10:06 AM

## 2010-05-11 NOTE — Progress Notes (Signed)
Summary: pt's bp low  Phone Note Call from Patient   Caller: dtr [phone] rhonda Reason for Call: Talk to Nurse Summary of Call: pt's bp been low a 1 month, this am  was 101/60 however, what to do? Initial call taken by: Glynda Jaeger,  May 03, 2010 9:59 AM  Follow-up for Phone Call        left message for Bjorn Loser on voice mail - 101/60 is an OK BP.  pt to continue same medications (she has been holding Metoprolol)  Will review with Dr Antoine Poche to see if he wants to continue to hold Metoprolol.  Instructed daughter to make sure pt is drinking plenty of fluids and wearing compression stockings as ordered.  Requested daughter call back with further concerns.   per [facility]ynn from [facility]- [facility] -[phone]. need report blood pressure today- meds- b/p ortho - lying 122/62. sitting 92/50. standing 82/48. pulse 98-107. c/o mild 4-10 left lateral chestpain . no other sympthons Lorne Skeens  May 04, 2010 11:51 AM Follow-up by: Charolotte Capuchin, RN,  May 03, 2010 10:52 AM  Additional Follow-up for Phone Call Additional follow up Details #1::        Spoke with Larita Fife from [facility]. She wanted to make sure Dr. Antoine Poche is aware of blood pressure's today.  Pt is continuing to hold metoprolol.  Chest pain relieved on own.  I told Larita Fife I would forward to Dr. Antoine Poche to review. Larita Fife aware Dr. Antoine Poche will be back in office tomorrow.   Additional Follow-up by: Dossie Arbour, RN, BSN,  May 04, 2010 2:36 PM    Additional Follow-up for Phone Call Additional follow up Details #2::    Continue to hold metoprolol. Follow-up by: Rollene Rotunda, MD, [facility],  May 04, 2010 10:32 PM  Additional Follow-up for Phone Call Additional follow up Details #3:: Details for Additional Follow-up Action Taken: plavix 75 RX mailed to pt at her request Additional Follow-up by: Charolotte Capuchin, RN,  May 06, 2010 3:17 PM

## 2010-05-18 ENCOUNTER — Encounter: Payer: Self-pay | Admitting: Cardiology

## 2010-05-18 ENCOUNTER — Ambulatory Visit (INDEPENDENT_AMBULATORY_CARE_PROVIDER_SITE_OTHER): Payer: Medicare Other | Admitting: Cardiology

## 2010-05-18 DIAGNOSIS — M109 Gout, unspecified: Secondary | ICD-10-CM | POA: Insufficient documentation

## 2010-05-18 DIAGNOSIS — I2510 Atherosclerotic heart disease of native coronary artery without angina pectoris: Secondary | ICD-10-CM

## 2010-05-18 DIAGNOSIS — E7800 Pure hypercholesterolemia, unspecified: Secondary | ICD-10-CM

## 2010-05-19 ENCOUNTER — Telehealth: Payer: Self-pay | Admitting: Cardiology

## 2010-05-20 NOTE — Miscellaneous (Signed)
Summary: [facility] Patient Care Update   [facility] Patient Care Update   Imported By: Roderic Ovens 05/11/2010 11:39:50  _____________________________________________________________________  External Attachment:    Type:   Image     Comment:   External Document

## 2010-05-24 ENCOUNTER — Telehealth: Payer: Self-pay | Admitting: Cardiology

## 2010-05-25 NOTE — Assessment & Plan Note (Signed)
Summary: f/u as per Tereso Newcomer, PA-C   Visit Type:  Follow-up Primary Provider:  Yetta Flock, MD  CC:  CAD.  History of Present Illness: The patient presents for followup of coronary artery disease. She is making a slow but steady recovery from her bypass. Her biggest issue has been gout which is predominantly now in her left hand. She's not having chest pressure, neck or arm discomfort. She is not having any shortness of breath, PND or orthopnea. She denied having any palpitations, presyncope or syncope. Her diabetes is under better control.  She is complete PT and wants to start cardiac rehab.  Of note her low blood pressure and orthostasis is improved. Midodrin did not help and actually made her nauseated. She needs just a note to get a lipid profile.  Current Medications (verified): 1)  Lantus Solostar 100 Unit/ml Soln (Insulin Glargine) .... As Directed 2)  Plavix 75 Mg Tabs (Clopidogrel Bisulfate) .... Take One Tablet By Mouth Daily 3)  Pravastatin Sodium 40 Mg Tabs (Pravastatin Sodium) .Marland Kitchen.. 1 By Mouth Daily 4)  Midodrine Hcl 5 Mg Tabs (Midodrine Hcl) .... Take 1 Tablet By Mouth Daily 5)  Colcrys 0.6 Mg Tabs (Colchicine) .... Take 1 Tablet By Mouth Two Times A Day 6)  Onglyza 5 Mg Tabs (Saxagliptin Hcl) .... Take 1 Tablet By Mouth Once A Day 7)  Tylenol 325 Mg Tabs (Acetaminophen) .... As Needed  Allergies: 1)  ! Asa 2)  ! Demerol  Past History:  Past Medical History: Last updated: 04/19/2010 CAD    a. status post CABG January 2012 Ejection fraction 50% cardiac catheterization 03/2010 Orthostatic hypotension-Midodrine  therapy carotid Dopplers January 2012: 40-59% bilateral ICA stenosis Diabetes x (last A1c 14) Hypothyroidism  Review of Systems       As stated in the HPI and negative for all other systems except for numbness and tingling left arm.   Vital Signs:  Patient profile:   65 year old female Height:      66 inches Weight:      175.50 pounds BMI:      28.43 Pulse rate:   97 / minute Pulse rhythm:   regular Resp:     18 per minute BP sitting:   96 / 63  (right arm) Cuff size:   large  Vitals Entered By: Vikki Ports (May 18, 2010 9:33 AM)   Physical Exam  General:  Well developed, well nourished, in no acute distress. Head:  normocephalic and atraumatic Eyes:  PERRLA/EOM intact; conjunctiva and lids normal. Neck:  Neck supple, no JVD. No masses, thyromegaly or abnormal cervical nodes. Chest Wall:  well-healed sternotomy scar Lungs:  Clear bilaterally to auscultation and percussion. Heart:  Non-displaced PMI, chest non-tender; regular rate and rhythm, S1, S2 without murmurs, rubs or gallops. Carotid upstroke normal, no bruit. Normal abdominal aortic size, no bruits. Femorals normal pulses, no bruits. Pedals normal pulses. No edema, no varicosities. Abdomen:  Bowel sounds positive; abdomen soft and non-tender without masses, organomegaly, or hernias noted. No hepatosplenomegaly. Msk:  Back normal, normal gait. Muscle strength and tone normal. Extremities:  No clubbing or cyanosis, left hand swollen Neurologic:  Alert and oriented x 3. Skin:  Intact without lesions or rashes. Cervical Nodes:  no significant adenopathy Psych:  Normal affect.    Impression & Recommendations:  Problem # 1:  CAD (ICD-414.00) The patient would be cleared for cardiac rehabilitation. She will otherwise continue meds as listed. Orders: EKG w/ Interpretation (93000)  Problem #  2:  ORTHOSTATIC HYPOTENSION (ICD-458.0)  Since the Midodrine is not helping we will DC this.  She will continue to avoid high risk behaviors. I suspect this will slowly improve.  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  Her updated medication list for this problem includes:    Plavix 75 Mg Tabs (Clopidogrel bisulfate) .Marland Kitchen... Take one tablet by mouth daily  Problem # 4:  HYPERLIPIDEMIA (ICD-272.4) I will  give her written instructions for repeat lipid profile in about 4 weeks with a goal LDL less than 70 and HDL greater than 50. Her updated medication list for this problem includes:    Pravastatin Sodium 40 Mg Tabs (Pravastatin sodium) .Marland Kitchen... 1 by mouth daily  Problem # 5:  GOUT, UNSPECIFIED (ICD-274.9) I have asked her to follow with her primary provider as she may need steroids which would interfere with her diabetes.  Patient Instructions: 1)  Your physician recommends that you schedule a follow-up appointment in: 4 months with Dr Antoine Poche 2)  Your physician recommends that you return for a FASTING lipid and liver profile: in the next couple of weeks  272.2 v58.69 3)  Your physician has recommended you make the following change in your medication: Stop Midodrine

## 2010-06-01 NOTE — Progress Notes (Signed)
Summary: Pravastatin 90 supply  Phone Note Call from Patient   Caller: Patient Summary of Call: pt calling re [facility]acy [facility] number [phone] pravastatin 3 month supply to be delivered Initial call taken by: Glynda Jaeger,  May 19, 2010 3:27 PM  Follow-up for Phone Call        [facility] for pt to call back. Marrion Coy, CNA  May 21, 2010 8:58 AM  pt rtn call-pls call [phone] Glynda Jaeger  May 21, 2010 2:32 PM  Pt returning call Judie Grieve  May 24, 2010 1:34 PM Follow-up by: Marrion Coy, CNA,  May 21, 2010 8:58 AM  Additional Follow-up for Phone Call Additional follow up Details #1::        Spoke with pt, she would like [facility] home delivery. Called [facility], approved Pravastatin 90 supply with 1 refill.  Pharmacy is to call pt to confirm service.  Called pt to inform her of Pharmacy to call her at [phone], she is waiting on Pharmacy to call her and confirm. Caralee Ates CMA  May 24, 2010 1:50 PM  Additional Follow-up by: Caralee Ates CMA,  May 24, 2010 1:50 PM

## 2010-06-01 NOTE — Progress Notes (Signed)
Summary: pt wants handicap sticker     Phone Note Call from Patient Call back at Home Phone [phone]   Caller: Patient Reason for Call: Talk to Nurse, Talk to Doctor Summary of Call: pt wants to get a handicap sticker Initial call taken by: Omer Jack,  May 24, 2010 4:02 PM  Follow-up for Phone Call        will ask Dr Antoine Poche  Avie Arenas, RN  Additional Follow-up for Phone Call Additional follow up Details #1::        Very orthostatic.  OK Additional Follow-up by: Rollene Rotunda, MD, [facility],  May 25, 2010 1:40 PM    Additional Follow-up for Phone Call Additional follow up Details #2::    form filled out and mailed to pt. Follow-up by: Charolotte Capuchin, RN,  May 27, 2010 11:07 AM

## 2010-06-23 ENCOUNTER — Encounter: Payer: Self-pay | Admitting: Cardiology

## 2010-06-28 ENCOUNTER — Encounter (INDEPENDENT_AMBULATORY_CARE_PROVIDER_SITE_OTHER): Payer: Self-pay | Admitting: Thoracic Surgery (Cardiothoracic Vascular Surgery)

## 2010-06-28 ENCOUNTER — Other Ambulatory Visit: Payer: Medicare Other

## 2010-06-28 DIAGNOSIS — I2510 Atherosclerotic heart disease of native coronary artery without angina pectoris: Secondary | ICD-10-CM

## 2010-06-29 NOTE — Assessment & Plan Note (Signed)
OFFICE VISIT  [name], [name] DOB:  Nov 08, 1945                                        June 28, 2010 CHART #:  [id number]  HISTORY OF PRESENT ILLNESS:  The patient returns for routine followup, status post coronary artery bypass grafting on March 31, 2010.  She was last seen here in the office April 19, 2010.  Since then, she has done exceptionally well.  She has completed a cardiac rehab program and physical therapy and has made excellent progress.  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  This is improving with time.  She was treated for pseudogout and this seemed to help the recovery.  She is proud to report that in every other respect, she is doing exceptionally well.  Her blood sugar glycemic control has been excellent.  Her activity level is great.  She has no pain.  She has no shortness of breath.  She is getting around quite well.  The remainder of her review of systems unremarkable.  The remainder of her past medical history is unchanged.  PHYSICAL EXAMINATION:  General:  Well-appearing female.  Vital Signs: Blood pressure 118/72, pulse 116 and oxygen saturation 95% on room air. Chest:  Examination of the chest reveals a median sternotomy scar that is healed nicely.  The sternum is stable on palpation.  Breath sounds are clear to auscultation.  No wheezes, rales or rhonchi are noted. Cardiovascular:  Regular rate and rhythm.  No murmurs, rubs or gallops are appreciated.  Abdomen:  Soft and nontender.  Extremities:  Warm and well-perfused.  There is no lower extremity edema.  There is still swelling involving her left forearm and the digits of her left hand. She has limited mobility of this hand, but this has reportedly improved.  IMPRESSION:  Satisfactory progress following coronary artery  bypass grafting.  PLAN:  In the future, the patient will call and return to see [place] as needed.  All of her questions have been addressed.  Salvatore Decent. Cornelius Moras, M.D. Electronically Signed  CHO/MEDQ  D:  06/28/2010  T:  06/29/2010  Job:  [id number]  cc:   Rollene Rotunda, MD, [facility]

## 2010-07-09 ENCOUNTER — Telehealth: Payer: Self-pay | Admitting: *Deleted

## 2010-07-09 DIAGNOSIS — E785 Hyperlipidemia, unspecified: Secondary | ICD-10-CM

## 2010-07-09 MED ORDER — ATORVASTATIN CALCIUM 40 MG PO TABS: 40.0000 mg | ORAL_TABLET | Freq: Every day | ORAL | Status: DC

## 2010-07-12 NOTE — Telephone Encounter (Signed)
complete

## 2010-07-19 ENCOUNTER — Telehealth: Payer: Self-pay | Admitting: Cardiology

## 2010-07-19 DIAGNOSIS — E7800 Pure hypercholesterolemia, unspecified: Secondary | ICD-10-CM

## 2010-07-19 NOTE — Telephone Encounter (Signed)
Pt aware that we can not mail samples to her home.  Order for fasting lipid profile placed for 5/16

## 2010-07-19 NOTE — Telephone Encounter (Signed)
Pt wants to know if her plavix can be mail to her home instead of her picking them up.  Pt having blood work done on may 16th pt needs an order to get her cholesterol check as well.

## 2010-07-28 ENCOUNTER — Other Ambulatory Visit: Payer: Medicare Other | Admitting: *Deleted

## 2010-07-28 ENCOUNTER — Telehealth: Payer: Self-pay | Admitting: Cardiology

## 2010-07-28 NOTE — Telephone Encounter (Signed)
Fasting blood work faxed to Dr. Rondel Baton office for Friday 07/30/10.

## 2010-07-28 NOTE — Telephone Encounter (Signed)
Need blood work order to be sent to Dr. Abram S[facility] [place].

## 2010-08-04 ENCOUNTER — Encounter: Payer: Self-pay | Admitting: Cardiology

## 2010-08-04 ENCOUNTER — Telehealth: Payer: Self-pay | Admitting: Cardiology

## 2010-08-04 NOTE — Telephone Encounter (Signed)
Called and left message for pt that I have not received results from Dr Rondel Baton office as of yet.  Will call once we received and MD has reviewed.

## 2010-08-04 NOTE — Telephone Encounter (Signed)
Pt calling wanting blood test results

## 2010-08-05 ENCOUNTER — Encounter: Payer: PRIVATE HEALTH INSURANCE | Admitting: Family Medicine

## 2010-08-06 ENCOUNTER — Telehealth: Payer: Self-pay | Admitting: Cardiology

## 2010-08-06 NOTE — Telephone Encounter (Signed)
Reviewed results of lipid profile with pt.

## 2010-08-06 NOTE — Telephone Encounter (Signed)
Pt would like lab results.  

## 2010-08-13 ENCOUNTER — Encounter: Payer: PRIVATE HEALTH INSURANCE | Admitting: Family Medicine

## 2010-08-16 ENCOUNTER — Encounter: Payer: Self-pay | Admitting: Cardiology

## 2010-08-31 NOTE — Progress Notes (Signed)
Left message for pt to increase pravastatin to 80 mg and repeat labs in 8 weeks.  Also asked her to call back with the name of the pharmacy she wants new rx sent to

## 2010-09-01 ENCOUNTER — Telehealth: Payer: Self-pay | Admitting: Cardiology

## 2010-09-01 NOTE — Telephone Encounter (Signed)
Pt calling re question on  medication °

## 2010-09-01 NOTE — Telephone Encounter (Signed)
Pt wants to wait until she sees Dr Antoine Poche before increasing Pravastatin to 80 mg a day.  They will discuss it at her appt next weeks

## 2010-09-07 ENCOUNTER — Encounter: Payer: Self-pay | Admitting: Cardiology

## 2010-09-07 ENCOUNTER — Ambulatory Visit (INDEPENDENT_AMBULATORY_CARE_PROVIDER_SITE_OTHER): Payer: Medicare Other | Admitting: Cardiology

## 2010-09-07 DIAGNOSIS — I951 Orthostatic hypotension: Secondary | ICD-10-CM

## 2010-09-07 DIAGNOSIS — I2510 Atherosclerotic heart disease of native coronary artery without angina pectoris: Secondary | ICD-10-CM

## 2010-09-07 DIAGNOSIS — E785 Hyperlipidemia, unspecified: Secondary | ICD-10-CM

## 2010-09-07 MED ORDER — PRAVASTATIN SODIUM 80 MG PO TABS: 80.0000 mg | ORAL_TABLET | Freq: Every evening | ORAL | Status: DC

## 2010-09-07 NOTE — Assessment & Plan Note (Signed)
This is still a problem.  We discussed this.  At this point I do not want to start any oral therapy other than hydration.  We discussed avoidence of situations in which she might get symptomatic.

## 2010-09-07 NOTE — Assessment & Plan Note (Signed)
I will increase her Pravachol to 80 mg qhs.  I will defer to her endocrinologist and primary care team further lipid management with a goal LDL less than 70 and HDL greater than 50.

## 2010-09-07 NOTE — Progress Notes (Signed)
HPI The patient presents for followup of coronary disease status post CABG. After her last lipid profile with an LDL of 109 I suggested that she increase her pravastatin to 80 mg daily. She didn't do this as she had had such a significant improvement already. She has been doing wonderfully with lifestyle modification. She is participating in cardiac rehabilitation. She is eating well. She still has problems with orthostasis and will get lightheaded. She doesn't like wearing compression stockings. She can avoid syncope or presyncope by lying down for a few minutes. She denies any other previous chest pressure, neck or arm discomfort. She denies any new shortness of breath, PND or orthopnea. She's had no new edema.  Allergies  Allergen Reactions  . Aspirin   . Meperidine Hcl     Current Outpatient Prescriptions  Medication Sig Dispense Refill  . acetaminophen (TYLENOL) 325 MG tablet Take 650 mg by mouth as needed.        . clopidogrel (PLAVIX) 75 MG tablet Take 75 mg by mouth daily.        . insulin glargine (LANTUS) 100 UNIT/ML injection Inject into the skin as directed.        . pravastatin (PRAVACHOL) 40 MG tablet Take 40 mg by mouth daily.        . Psyllium (METAMUCIL PO) Take by mouth daily.        . saxagliptin HCl (ONGLYZA) 5 MG TABS tablet Take 5 mg by mouth daily.        . traMADol (ULTRAM) 50 MG tablet Take 50 mg by mouth every 6 (six) hours as needed.        Marland Kitchen DISCONTD: atorvastatin (LIPITOR) 40 MG tablet Take 1 tablet (40 mg total) by mouth daily.  30 tablet  11  . DISCONTD: colchicine 0.6 MG tablet Take 0.6 mg by mouth 2 (two) times daily.          Past Medical History  Diagnosis Date  . CAD (coronary artery disease)     S/P CABG January 2012  . S/P cardiac catheterization 03/2010    EF 50%   . Orthostatic hypotension     Midodrine therapy  . DM (diabetes mellitus)     Last A1c 14  . Hypothyroidism     Past Surgical History  Procedure Date  . Coronary artery bypass  graft 03/2010  . Cardiac catheterization 03/2010  . Carotid dopplers 03/2010    40-59% bilateral ICA stenosis  . Appendectomy   . Tonsillectomy     ROS:  As stated in the HPI and negative for all other systems.  PHYSICAL EXAM BP 131/83  Pulse 96  Resp 16  Ht 5\' 5"  (1.651 m)  Wt 171 lb (77.565 kg)  BMI 28.46 kg/m2 GENERAL:  Well appearing HEENT:  Pupils equal round and reactive, fundi not visualized, oral mucosa unremarkable NECK:  No jugular venous distention, waveform within normal limits, carotid upstroke brisk and symmetric, no bruits, no thyromegaly LYMPHATICS:  No cervical, inguinal adenopathy LUNGS:  Clear to auscultation bilaterally BACK:  No CVA tenderness CHEST:  Well healed sternotomy scar. HEART:  PMI not displaced or sustained,S1 and S2 within normal limits, no S3, no S4, no clicks, no rubs, no murmurs ABD:  Flat, positive bowel sounds normal in frequency in pitch, no bruits, no rebound, no guarding, no midline pulsatile mass, no hepatomegaly, no splenomegaly EXT:  2 plus pulses throughout, no edema, no cyanosis no clubbing SKIN:  No rashes no nodules NEURO:  Cranial nerves  II through XII grossly intact, motor grossly intact throughout [facility]:  Cognitively intact, oriented to person place and time   EKG:  Sinus rhythm, rate 92, LAD, intervals within normal limits, no acute ST-T wave changes.   ASSESSMENT AND PLAN

## 2010-09-07 NOTE — Patient Instructions (Signed)
Please increase your Pravastatin to 80 mg a day. Continue all other medications as listed Follow up in 6 months with Dr Antoine Poche.  You will receive a letter in the mail 2 months before you are due.  Please call us when you receive this letter to schedule your follow up appointment.

## 2010-09-07 NOTE — Assessment & Plan Note (Signed)
The patient has no new sypmtoms.  No further cardiovascular testing is indicated.  We will continue with aggressive risk reduction and meds as listed.  

## 2010-09-10 ENCOUNTER — Telehealth: Payer: Self-pay | Admitting: Cardiology

## 2010-09-10 DIAGNOSIS — E785 Hyperlipidemia, unspecified: Secondary | ICD-10-CM

## 2010-09-10 NOTE — Telephone Encounter (Signed)
Pravastatin 80 mg. [facility] in [place]. [place].

## 2010-09-12 MED ORDER — PRAVASTATIN SODIUM 80 MG PO TABS: 80.0000 mg | ORAL_TABLET | Freq: Every evening | ORAL | Status: DC

## 2010-09-14 ENCOUNTER — Encounter: Payer: PRIVATE HEALTH INSURANCE | Admitting: Family Medicine

## 2010-09-14 ENCOUNTER — Telehealth: Payer: Self-pay | Admitting: Cardiology

## 2010-09-14 NOTE — Telephone Encounter (Signed)
Pt returning call regarding RX refill.

## 2010-09-14 NOTE — Telephone Encounter (Signed)
Please page me for this call when she calls back.  This is the patient that I could not find as seeing one of our physicians in the system.  Judithe Modest, CMA

## 2010-09-20 ENCOUNTER — Telehealth: Payer: Self-pay | Admitting: Cardiology

## 2010-09-20 DIAGNOSIS — E785 Hyperlipidemia, unspecified: Secondary | ICD-10-CM

## 2010-09-20 MED ORDER — PRAVASTATIN SODIUM 80 MG PO TABS: 80.0000 mg | ORAL_TABLET | Freq: Every evening | ORAL | Status: DC

## 2010-09-20 NOTE — Telephone Encounter (Signed)
Pt needs a refill on pravastatin 40mg  bid called into [facility] mail order

## 2010-09-30 ENCOUNTER — Telehealth: Payer: Self-pay | Admitting: Cardiology

## 2010-09-30 DIAGNOSIS — E785 Hyperlipidemia, unspecified: Secondary | ICD-10-CM

## 2010-09-30 MED ORDER — PRAVASTATIN SODIUM 40 MG PO TABS: 40.0000 mg | ORAL_TABLET | Freq: Two times a day (BID) | ORAL | Status: DC

## 2010-09-30 NOTE — Telephone Encounter (Signed)
Pt needs pravistatin 40mg  BID for 90days pt needs RX written like this so she can get discount price [facility] mail order fax# [phone] pt is completely out and needs this done today

## 2010-10-13 ENCOUNTER — Encounter: Payer: PRIVATE HEALTH INSURANCE | Admitting: Family Medicine

## 2010-11-10 ENCOUNTER — Other Ambulatory Visit (HOSPITAL_COMMUNITY): Payer: Medicare Other

## 2010-11-10 ENCOUNTER — Emergency Department (HOSPITAL_COMMUNITY): Payer: Medicare Other

## 2010-11-10 ENCOUNTER — Inpatient Hospital Stay (HOSPITAL_COMMUNITY)
Admission: EM | Admit: 2010-11-10 | Discharge: 2010-11-14 | DRG: 419 | Disposition: A | Discharge status: Home / Self Care | Payer: Medicare Other | Attending: General Surgery | Admitting: General Surgery

## 2010-11-10 DIAGNOSIS — E669 Obesity, unspecified: Secondary | ICD-10-CM | POA: Diagnosis present

## 2010-11-10 DIAGNOSIS — I2510 Atherosclerotic heart disease of native coronary artery without angina pectoris: Secondary | ICD-10-CM | POA: Diagnosis present

## 2010-11-10 DIAGNOSIS — Z79899 Other long term (current) drug therapy: Secondary | ICD-10-CM

## 2010-11-10 DIAGNOSIS — I6529 Occlusion and stenosis of unspecified carotid artery: Secondary | ICD-10-CM | POA: Diagnosis present

## 2010-11-10 DIAGNOSIS — E1149 Type 2 diabetes mellitus with other diabetic neurological complication: Secondary | ICD-10-CM | POA: Diagnosis present

## 2010-11-10 DIAGNOSIS — Z794 Long term (current) use of insulin: Secondary | ICD-10-CM

## 2010-11-10 DIAGNOSIS — Z01810 Encounter for preprocedural cardiovascular examination: Secondary | ICD-10-CM

## 2010-11-10 DIAGNOSIS — R112 Nausea with vomiting, unspecified: Secondary | ICD-10-CM

## 2010-11-10 DIAGNOSIS — E785 Hyperlipidemia, unspecified: Secondary | ICD-10-CM | POA: Diagnosis present

## 2010-11-10 DIAGNOSIS — R1011 Right upper quadrant pain: Secondary | ICD-10-CM

## 2010-11-10 DIAGNOSIS — K8000 Calculus of gallbladder with acute cholecystitis without obstruction: Principal | ICD-10-CM | POA: Diagnosis present

## 2010-11-10 DIAGNOSIS — I658 Occlusion and stenosis of other precerebral arteries: Secondary | ICD-10-CM | POA: Diagnosis present

## 2010-11-10 DIAGNOSIS — Z7902 Long term (current) use of antithrombotics/antiplatelets: Secondary | ICD-10-CM

## 2010-11-10 DIAGNOSIS — E1142 Type 2 diabetes mellitus with diabetic polyneuropathy: Secondary | ICD-10-CM | POA: Diagnosis present

## 2010-11-10 DIAGNOSIS — Z951 Presence of aortocoronary bypass graft: Secondary | ICD-10-CM

## 2010-11-10 LAB — CBC
HCT: 38.7 % (ref 36.0–46.0)
Hemoglobin: 13.6 g/dL (ref 12.0–15.0)
MCH: 29.1 pg (ref 26.0–34.0)
MCHC: 35.1 g/dL (ref 30.0–36.0)
MCV: 82.7 fL (ref 78.0–100.0)
Platelets: 179 10*3/uL (ref 150–400)
RBC: 4.68 MIL/uL (ref 3.87–5.11)
RDW: 13.3 % (ref 11.5–15.5)
WBC: 16.9 10*3/uL — ABNORMAL HIGH (ref 4.0–10.5)

## 2010-11-10 LAB — GLUCOSE, CAPILLARY
Glucose-Capillary: 125 mg/dL — ABNORMAL HIGH (ref 70–99)
Glucose-Capillary: 104 mg/dL — ABNORMAL HIGH (ref 70–99)
Glucose-Capillary: 140 mg/dL — ABNORMAL HIGH (ref 70–99)

## 2010-11-10 LAB — COMPREHENSIVE METABOLIC PANEL
ALT: 10 U/L (ref 0–35)
AST: 14 U/L (ref 0–37)
Albumin: 3.7 g/dL (ref 3.5–5.2)
Alkaline Phosphatase: 43 U/L (ref 39–117)
BUN: 18 mg/dL (ref 6–23)
CO2: 26 mEq/L (ref 19–32)
Calcium: 9.6 mg/dL (ref 8.4–10.5)
Chloride: 104 mEq/L (ref 96–112)
Creatinine, Ser: 0.57 mg/dL (ref 0.50–1.10)
GFR calc Af Amer: >60 mL/min (ref >60)
GFR calc non Af Amer: >60 mL/min (ref >60)
Glucose, Bld: 169 mg/dL — ABNORMAL HIGH (ref 70–99)
Potassium: 4 mEq/L (ref 3.5–5.1)
Sodium: 141 mEq/L (ref 135–145)
Total Bilirubin: 0.4 mg/dL (ref 0.3–1.2)
Total Protein: 7.4 g/dL (ref 6.0–8.3)

## 2010-11-10 LAB — DIFFERENTIAL
Basophils Absolute: 0 10*3/uL (ref 0.0–0.1)
Basophils Relative: 0 % (ref 0–1)
Eosinophils Absolute: 0 10*3/uL (ref 0.0–0.7)
Eosinophils Relative: 0 % (ref 0–5)
Lymphocytes Relative: 7 % — ABNORMAL LOW (ref 12–46)
Lymphs Abs: 1.1 10*3/uL (ref 0.7–4.0)
Monocytes Absolute: 1 10*3/uL (ref 0.1–1.0)
Monocytes Relative: 6 % (ref 3–12)
Neutro Abs: 14.7 10*3/uL — ABNORMAL HIGH (ref 1.7–7.7)
Neutrophils Relative %: 87 % — ABNORMAL HIGH (ref 43–77)

## 2010-11-10 LAB — TSH: TSH: 0.7 u[IU]/mL (ref 0.350–4.500)

## 2010-11-10 LAB — CK TOTAL AND CKMB (NOT AT ARMC)
CK, MB: 2.7 ng/mL (ref 0.3–4.0)
Relative Index: INVALID (ref 0.0–2.5)
Total CK: 51 U/L (ref 7–177)

## 2010-11-10 LAB — TROPONIN I: Troponin I: <0.3 ng/mL (ref <0.30)

## 2010-11-10 LAB — LIPASE, BLOOD: Lipase: 41 U/L (ref 11–59)

## 2010-11-11 LAB — BASIC METABOLIC PANEL
BUN: 12 mg/dL (ref 6–23)
CO2: 26 mEq/L (ref 19–32)
Calcium: 8.7 mg/dL (ref 8.4–10.5)
Chloride: 104 mEq/L (ref 96–112)
Creatinine, Ser: 0.53 mg/dL (ref 0.50–1.10)
GFR calc Af Amer: >60 mL/min (ref >60)
GFR calc non Af Amer: >60 mL/min (ref >60)
Glucose, Bld: 124 mg/dL — ABNORMAL HIGH (ref 70–99)
Potassium: 3.8 mEq/L (ref 3.5–5.1)
Sodium: 138 mEq/L (ref 135–145)

## 2010-11-11 LAB — CBC
HCT: 36.5 % (ref 36.0–46.0)
Hemoglobin: 12.3 g/dL (ref 12.0–15.0)
MCH: 28.2 pg (ref 26.0–34.0)
MCHC: 33.7 g/dL (ref 30.0–36.0)
MCV: 83.7 fL (ref 78.0–100.0)
Platelets: 143 10*3/uL — ABNORMAL LOW (ref 150–400)
RBC: 4.36 MIL/uL (ref 3.87–5.11)
RDW: 13.3 % (ref 11.5–15.5)
WBC: 9.8 10*3/uL (ref 4.0–10.5)

## 2010-11-11 LAB — T4, FREE: Free T4: 0.88 ng/dL (ref 0.80–1.80)

## 2010-11-11 LAB — GLUCOSE, CAPILLARY
Glucose-Capillary: 113 mg/dL — ABNORMAL HIGH (ref 70–99)
Glucose-Capillary: 98 mg/dL (ref 70–99)
Glucose-Capillary: 124 mg/dL — ABNORMAL HIGH (ref 70–99)
Glucose-Capillary: 104 mg/dL — ABNORMAL HIGH (ref 70–99)

## 2010-11-12 ENCOUNTER — Inpatient Hospital Stay (HOSPITAL_COMMUNITY): Payer: Medicare Other

## 2010-11-12 ENCOUNTER — Other Ambulatory Visit (INDEPENDENT_AMBULATORY_CARE_PROVIDER_SITE_OTHER): Payer: Self-pay | Admitting: Surgery

## 2010-11-12 DIAGNOSIS — K8010 Calculus of gallbladder with chronic cholecystitis without obstruction: Secondary | ICD-10-CM

## 2010-11-12 LAB — GLUCOSE, CAPILLARY
Glucose-Capillary: 95 mg/dL (ref 70–99)
Glucose-Capillary: 89 mg/dL (ref 70–99)
Glucose-Capillary: 161 mg/dL — ABNORMAL HIGH (ref 70–99)
Glucose-Capillary: 135 mg/dL — ABNORMAL HIGH (ref 70–99)
Glucose-Capillary: 148 mg/dL — ABNORMAL HIGH (ref 70–99)
Glucose-Capillary: 98 mg/dL (ref 70–99)
Glucose-Capillary: 137 mg/dL — ABNORMAL HIGH (ref 70–99)

## 2010-11-12 LAB — MRSA PCR SCREENING: MRSA by PCR: NEGATIVE

## 2010-11-13 LAB — GLUCOSE, CAPILLARY
Glucose-Capillary: 129 mg/dL — ABNORMAL HIGH (ref 70–99)
Glucose-Capillary: 133 mg/dL — ABNORMAL HIGH (ref 70–99)
Glucose-Capillary: 163 mg/dL — ABNORMAL HIGH (ref 70–99)
Glucose-Capillary: 104 mg/dL — ABNORMAL HIGH (ref 70–99)

## 2010-11-14 LAB — CBC
HCT: 32.9 % — ABNORMAL LOW (ref 36.0–46.0)
Hemoglobin: 11.3 g/dL — ABNORMAL LOW (ref 12.0–15.0)
MCH: 28.7 pg (ref 26.0–34.0)
MCHC: 34.3 g/dL (ref 30.0–36.0)
MCV: 83.5 fL (ref 78.0–100.0)
Platelets: 166 10*3/uL (ref 150–400)
RBC: 3.94 MIL/uL (ref 3.87–5.11)
RDW: 12.9 % (ref 11.5–15.5)
WBC: 7.5 10*3/uL (ref 4.0–10.5)

## 2010-11-14 LAB — GLUCOSE, CAPILLARY
Glucose-Capillary: 117 mg/dL — ABNORMAL HIGH (ref 70–99)
Glucose-Capillary: 152 mg/dL — ABNORMAL HIGH (ref 70–99)

## 2010-11-17 NOTE — Op Note (Signed)
  [name], [name]               ACCOUNT NO.:  [id number]  MEDICAL RECORD NO.:  [id number]  LOCATION:  2503                         FACILITY:  [facility]  PHYSICIAN:  Thornton Park. Daphine Deutscher, MD  DATE OF BIRTH:  1945/12/23  DATE OF PROCEDURE:  11/12/2010 DATE OF DISCHARGE:                              OPERATIVE REPORT   PREOP INDICATION:  This is a 65 year old white female with history of coronary artery bypass surgery earlier this year, diabetes presenting on Plavix with acute cholecystitis.  PROCEDURE:  Laparoscopic cholecystectomy with intraoperative cholangiogram.  SURGEON:  Thornton Park. Daphine Deutscher, MD  ASSISTANT:  None.  ANESTHESIA:  General endotracheal.  FINDINGS:  Acute edematous cholecystitis with a normal intraoperative cholangiogram.  DESCRIPTION OF PROCEDURE:  This 65 year old white female was taken to OR 17 on Friday, November 12, 2010, given general anesthesia.  After time-out was performed, she was prepped and draped in usual fashion.  I made a longitudinal incision down under umbilicus and began dissecting and removing it what was a like fatty mass giving her anatomy.  I thought this was probably a hernia.  I went ahead and opened into the abdomen, I thought it was actually in the preperitoneal space.  She had a previous incision there, I did not want to risk on into bowel, so I used 5-mm OptiView in the left upper quadrant, entered the abdomen without difficulty.  The abdomen was insufflated and then I put the Hasson in the umbilicus.  Another 10 and 11 in the upper midline and 5 laterally. Gallbladder was grasped and decompressed with a Nevzat sucker.  I then began a rather tedious dissection because her infundibulum was quite stuck and inflamed to the surrounding structures.  I dissected these free and was able to clip the gallbladder, incised the cystic duct and do a dynamic cholangiogram using a Reddick catheter inflating the balloon, put holding in place with a clip.   She had a fairly small common bile duct with intrahepatic filling of free flow in the duodenum. Cystic duct was triple clipped, but I did not like the way it looked, so I went ahead and left 2 clips on and put an Endoloop of PDS on this.  The gallbladder was then removed from gallbladder bed with hook electrocautery.  Anything looked like a bleeding vessel was clipped and gallbladder was removed and then placed in a bag and brought out through the umbilicus.  Gallbladder bed was inspected.  No bleeding was noted. Port sites were injected with 10 mL of 0.25% Marcaine. The umbilical defect was repaired with laparoscopic vision using a 0 Vicryl and all wounds were closed with 4-0 Vicryl, Benzoin Steri-Strips. The patient tolerated the procedure well, was taken to recovery room in satisfactory condition.     Thornton Park Daphine Deutscher, MD     MBM/MEDQ  D:  11/12/2010  T:  11/12/2010  Job:  [id number]  Electronically Signed by Luretha Murphy MD on 11/17/2010 01:39:27 PM

## 2010-11-22 NOTE — Consult Note (Addendum)
[name], [name]               ACCOUNT NO.:  [id number]  MEDICAL RECORD NO.:  [id number]  LOCATION:  [facility]                         FACILITY:  [facility]  PHYSICIAN:  Bevelyn Buckles. Tersea Aulds, MDDATE OF BIRTH:  02-14-1946  DATE OF CONSULTATION:  11/10/2010 DATE OF DISCHARGE:                                CONSULTATION   PRIMARY CARDIOLOGIST:  Rollene Rotunda, MD, [facility]  PRIMARY MEDICAL DOCTOR:  Dr. Candise Che at [facility].  CHIEF COMPLAINT:  Abdominal pain, nausea, and vomiting.  REASON FOR CONSULTATION:  Preop clearance for cholecystitis with possible surgery plan.  HISTORY OF PRESENT ILLNESS:  [name] is a pleasant 65 year old female with a history of CAD status post CABG in January of this year, diabetes mellitus, and hyperlipidemia who presented to [facility] with right-sided abdominal pain, nausea, and vomiting for several hours overnight.  It started approximately 6 p.m. yesterday and by 3 o'clock in the morning her pain was so bad she felt that she could hardly talk. She presented to the ER and was found to have acute cholecystitis by abdominal ultrasound with an elevated white blood cell count.  We are asked to see her regarding preop clearance for possible gallbladder surgery, particularly in regards to recent CABG and Plavix use.  She has been doing fairly well since bypass surgery and is going to Cardiac Rehab 3 times a week without any symptoms including chest pain, pressure, or shortness of breath.  She has had no lower extremity or orthopnea.  She is still occasionally limited by hypotension manifested occasionally by orthostasis, but the patient was able to symptomatically manage this.  She had an episode of low blood pressure at Cardiac Rehab last week and was held from exercise, but states she recovered well and had no further problems.  PAST MEDICAL HISTORY: 1. CAD, status post CABG in January 2012 with LIMA to the LAD, SVG to     the second diagonal,  SVG to the second OM.     a.     History of true ASPIRIN allergy, on Plavix for this reason. 2. Dyslipidemia.     a.     Recent LDL was 109, but the patient has been unable to      tolerate increases of the Pravachol. 3. Insulin-dependent diabetes mellitus. 4. Past history of hypothyroidism.  The patient thinks she was     misdiagnosed with this because her TSH was normal while she was     seeking Armour Thyroid, which would actually point adequate therapy     instead. 5. Peripheral vascular disease with bilateral 40-59% ICA carotid     stenosis by Dopplers in January 2012. 6. Orthostatic hypotension. 7. Status post appendectomy. 8. Status post tonsillectomy. 9. LV function with EF of 50% by cath in January 2012 and 45% by     intraoperative TEE.  MEDICATIONS: 1. Onglyza 5 mg daily. 2. Stool softener. 3. Tylenol p.r.n. 4. Plavix 75 mg daily. 5. Pravachol 40 mg nightly. 6. Lantus 5-6 units nightly.  ALLERGIES: 1. ASPIRIN caused instant hives and vomiting. 2. DEMEROL caused nausea and vomiting. 3. VITAMIN K caused hives.4. She is intolerant to PREDNISONE due to gas pain.  SOCIAL HISTORY:  The patient lives in [place].  She has 3 children. She works in a [employer]  She denies any alcohol or tobacco use.  FAMILY HISTORY:  Mother died at 65 with esophageal cancer.  Father died at 50 of an MI.  REVIEW OF SYSTEMS:  All other systems reviewed and otherwise negative except for those noted in the HPI.  LABORATORY DATA:  WBC 16.9, hemoglobin 13.6, hematocrit 38.7, and platelet count 179.  Sodium 141, potassium 4, chloride 104, CO2 of 26, glucose 169, BUN 18, and creatinine 0.57.  Lipase negative.  LFTs were normal.  Cardiac enzymes negative x1.  EKG:  Normal sinus rhythm, poor R-wave progression, but no change from prior.  RADIOLOGIC STUDIES:  Abdominal ultrasound showed suspected small gallstones with gallbladder wall thickening and positive sonographic Murphy  sign suspicious for early acute cholecystitis.  PHYSICAL EXAMINATION:  VITAL SIGNS:  Temperature 98.1, pulse 85, respirations 14, blood pressure 110/60, and pulse ox 98% on room air. GENERAL:  This is a pleasant, well-appearing white female, in no acute distress. HEENT:  Normocephalic and atraumatic.  Extraocular movements are intact. Clear sclerae.  Nares are without discharge. NECK:  Supple with bilateral carotid bruits. HEART:  Auscultation to the heart reveals regular rate and rhythm with S1 and S2 without murmurs, rubs, or gallops. LUNGS:  Clear to auscultation bilaterally without wheeze, rales, or rhonchi. ABDOMEN:  Soft with diffuse right quadrant tenderness.  Normoactive bowel sounds. EXTREMITIES:  Warm and dry without edema.  She has 2+ pedal pulses bilaterally. NEUROLOGIC:  She is alert and oriented x3 and responds questions appropriately with a normal affect.  ASSESSMENT/PLAN:  The patient was seen and examined by Dr. Gala Romney and myself.  This is a 65 year old lady with a history of coronary artery disease status post CABG in January 2012, chronic hypotension, and diabetes mellitus who has been doing well postoperatively.  She is going to Cardiac Rehab without anginal symptoms and now presents with cholecystitis.  We are asked to provide preop evaluation.  She is felt at low risk for perioperative cardiac complications and I feel she can proceed to surgery without further testing.  She is okay to be off Plavix, but would resume when Surgery feels clinically able.  Given possibility of her history of hypothyroidism, we will also add a TSH and free T4.  She should follow up postoperatively as previously planned.  Please call with questions.     Dayna Dunn, P.A.C.   ______________________________ Bevelyn Buckles. Danni Shima, MD    DD/MEDQ  D:  11/10/2010  T:  11/10/2010  Job:  [id number]  cc:   Dr. Candise Che  Electronically Signed by Ronie Spies  on 11/22/2010 07:36:10  PM Electronically Signed by Arvilla Meres MD on 12/01/2010 10:38:40 PM

## 2010-11-24 ENCOUNTER — Encounter (INDEPENDENT_AMBULATORY_CARE_PROVIDER_SITE_OTHER): Payer: Self-pay | Admitting: Surgery

## 2010-11-24 ENCOUNTER — Ambulatory Visit (INDEPENDENT_AMBULATORY_CARE_PROVIDER_SITE_OTHER): Payer: Medicare Other | Admitting: Surgery

## 2010-11-24 VITALS — BP 132/82 | HR 80

## 2010-11-24 DIAGNOSIS — Z9889 Other specified postprocedural states: Secondary | ICD-10-CM

## 2010-11-24 DIAGNOSIS — Z9049 Acquired absence of other specified parts of digestive tract: Secondary | ICD-10-CM

## 2010-11-24 NOTE — H&P (Signed)
[name], [name]               ACCOUNT NO.:  [id number]  MEDICAL RECORD NO.:  [id number]  LOCATION:  [facility]                         FACILITY:  [facility]  PHYSICIAN:  Thornton Park. Daphine Deutscher, MD  DATE OF BIRTH:  Dec 04, 1945  DATE OF ADMISSION:  11/10/2010 DATE OF DISCHARGE:                             HISTORY & PHYSICAL   REQUESTING PHYSICIAN:  Dr. Judd Lien, emergency room.  PRIMARY CARE DOCTOR:  Dr. Antoine Poche, Cardiology and Dr. Candise Che, [facility].  CHIEF COMPLAINT:  Abdominal pain.  BRIEF HISTORY:  The patient is a 65 year old female, who said yesterday around 6 p.m. she had pain in her midabdomen going to her right side. She then developed nausea and vomiting which was ongoing until around 4 a.m. and she came to the ER.  Towards the end, there was nothing, but bile and dry heaves but that continued until she was treated in the emergency room.  This is the fourth episode of abdominal pain, nausea, and vomiting since her CABG in January.  The other episodes have been the same as she always blamed it on GI bug or flu, the episodes frequently last anywhere 8-10 hours.  Currently, the patient is not having any pain at rest, but she notices discomfort with inspiration and with palpitation.  PAST MEDICAL HISTORY: 1. Coronary artery disease status post CABG Jaunary 18, 2012. 2. Postop hypertension and she still has orthostatic changes. 3. Adult-onset diabetes mellitus, insulin-dependent. 4. She has a history of hypothyroid on her prior chart, but she denies     this. 5. Dyslipidemia, being treated. 6. Diabetic neuropathy. 7. Obesity.  PAST SURGICAL HISTORY: 1. Status post CABG with LIMA to the LAD, saphenous vein graft second     diagonal, saphenous vein graft to left circ, March 31, 2010. 2. Status post appendectomy. 3. History of tubal ligation. 4. History of tonsil and adenoids.  FAMILY HISTORY:  Both parents are dead.  Father died with an MI.  Mother had esophageal cancer at  61.  Five siblings, one died with pneumonia at age 56.  Her brothers and sisters are positive for cancer, diabetes, and coronary artery disease.  SOCIAL HISTORY:  Tobacco:  None.  Drugs:  None.  Alcohol:  None.  She is separated.  She still works part-time and does catering.  REVIEW OF SYSTEMS:  Fever:  None.  Skin:  No changes.  Weight:  Stable, no changes.  CV:  Positive for ongoing episodes of presyncope with hypertension.  No history of stroke or seizures.  CARDIAC:  No chest pain since her surgery.  PULMONARY:  She still had mild orthopnea.  She does not feel comfortable, lying flat.  No PND.  No dyspnea on exertion. No coughing or wheezing.  She does cardiac rehab three times a week. GI:  Negative for GERD.  Positive for nausea and vomiting.  No diarrhea. Positive for constipation.  GU:  No trouble voiding.  GYN: Postmenstrual.  LOWER EXTREMITIES:  No edema.  No claudication.  She does have bilateral neuropathy which is better than it was post surgery in January.  MUSCULOSKELETAL:  Positive for mild arthritis in her hands and her spine.  PSYCH:  No changes.  CURRENT  MEDICATIONS: 1. Pravastatin 40 mg daily. 2. Plavix 75 mg daily. 3. Onglyza 5 mg daily. 4. Lantus 5-6 units daily p.m.  ALLERGIES:  ASPIRIN causes hives, nausea, and vomiting.  DEMEROL causes hives, nausea, and vomiting.  PREDNISONE and CORTISONE make her crazy, cause nausea and vomiting.  She also has problems with VITAMIN K and BEE STINGS.  PHYSICAL EXAM:  GENERAL:  This is a well-nourished, well-developed white female, in no acute distress. VITAL SIGNS:  Temperature is 98.7, heart rate is 96, blood pressure is 117/53, it is gone as low as 100/56, sats of 97%-100% on room air. HEENT:  Head:  Normocephalic.  Eyes, ears, nose, and throat are within normal limits. NECK:  Trachea is in the midline.  Thyroid is nonpalpable.  There is no JVD.  No bruits. CHEST:  Clear to auscultation and percussion.  Chest wall is  nontender. She has a well-healed sternotomy. CARDIAC:  Normal S1-S2.  No murmur or rub was heard.  Pulses are +2 and equal in the upper and lower extremities. ABDOMEN:  Positive bowel sounds.  She is not distended.  She is very tender mostly in the right upper quadrant, but she is also tender in the right lower quadrant.  There is a question of an umbilical hernia, very small, no masses. RECTAL/GU:  Deferred. LYMPHADENOPATHY:  None palpated cervical, axillary, or femoral. SKIN:  No changes noted. NEUROLOGIC:  Neurologically intact.  No focal changes.  Cranial nerves II-XII are normal. PSYCH:  Normal affect.  LABS:  Sodium is 141, potassium is 4, chloride is 104, CO2 is 26, BUN is 18, creatinine is 0.57, glucose 169, total bilirubin 0.4, alk phos 43, SGOT 14, SGPT 10, CK 51, CK-MB is 2.7, troponin is less than 0.3.  White count is 16,900, hemoglobin is 13.6, hematocrit is 38.7, platelets 179,000.  Ultrasound shows tiny gallstones and sludge.  There is a question of a stone in the gallbladder neck.  The gallbladder wall is 8 mm.  Positive Murphy sign.  Common bile duct is 5 mm.  IMPRESSION: 1. Cholelithiasis and cholecystitis. 2. Status post coronary artery bypass graft, January 2012, currently     on Plavix. 3. Hypertension. 4. Adult-onset diabetes mellitus, insulin-dependent. 5. Dyslipidemia, currently on treatment.  PLAN:  We are going to admit the patient, put her on antibiotics.  We will hold her Plavix and asked Dr. Antoine Poche to help with anticoagulation issues.  The patient would like to go home, but at this point, we are going to admit her and put her on some antibiotics as she is tender and discuss with Cardiology.     Eber Hong, P.A.   ______________________________ Thornton Park Daphine Deutscher, MD    WDJ/MEDQ  D:  11/10/2010  T:  11/10/2010  Job:  [id number]  cc:   Rollene Rotunda, MD, [facility] Dr. Candise Che  Electronically Signed by Sherrie George P.A. on 11/20/2010  09:14:40 PM Electronically Signed by Luretha Murphy MD on 11/24/2010 07:39:55 AM

## 2010-11-24 NOTE — Progress Notes (Signed)
PREOP INDICATION: This is a 65 year old white female with history of  coronary artery bypass surgery earlier this year, diabetes presenting on  Plavix with acute cholecystitis.  PROCEDURE: Laparoscopic cholecystectomy with intraoperative  cholangiogram.  SURGEON: Thornton Park. Daphine Deutscher, MD  ASSISTANT: None.  ANESTHESIA: General endotracheal.  FINDINGS: Acute edematous cholecystitis with a normal intraoperative  cholangiogram.  DESCRIPTION OF PROCEDURE: This 65 year old white female was taken to OR  17 on Friday, November 12, 2010, given general anesthesia. After time-out  was performed, she was prepped and draped in usual fashion. I made a  longitudinal incision down under umbilicus and began dissecting and  removing it what was a like fatty mass giving her anatomy. I thought  this was probably a hernia. I went ahead and opened into the abdomen, I  thought it was actually in the preperitoneal space. She had a previous  incision there, I did not want to risk on into bowel, so I used 5-mm  OptiView in the left upper quadrant, entered the abdomen without  difficulty. The abdomen was insufflated and then I put the Hasson in  the umbilicus. Another 10 and 11 in the upper midline and 5 laterally.  Gallbladder was grasped and decompressed with a Nevzat sucker. I then  began a rather tedious dissection because her infundibulum was quite  stuck and inflamed to the surrounding structures. I dissected these  free and was able to clip the gallbladder, incised the cystic duct and  do a dynamic cholangiogram using a Reddick catheter inflating the  balloon, put holding in place with a clip. She had a fairly small  common bile duct with intrahepatic filling of free flow in the duodenum.  Cystic duct was triple clipped, but I did not like the way it looked, so  I went ahead and left 2 clips on and put an Endoloop of PDS on this.  The gallbladder was then removed from gallbladder bed with hook  electrocautery.  Anything looked like a bleeding vessel was clipped and  gallbladder was removed and then placed in a bag and brought out through  the umbilicus. Gallbladder bed was inspected. No bleeding was noted.  Port sites were injected with 10 mL of 0.25% Marcaine.  The umbilical defect was repaired with laparoscopic vision using a 0  Vicryl and all wounds were closed with 4-0 Vicryl, Benzoin Steri-Strips.  The patient tolerated the procedure well, was taken to recovery room in  satisfactory condition.  Thornton Park Daphine Deutscher, MD  Postoperative visit for Mrs. Cumming. Her incisions are healing nicely. She is having some soreness as expected. Her appetite is improving. She has the usual decrease in stamina. Overall I think she has done very well after her laparoscopic cholecystectomy for acute cholecystitis.  Would be glad to see again as needed. Return p.r.n.

## 2010-12-28 NOTE — Discharge Summary (Signed)
  [name], [name]               ACCOUNT NO.:  [id number]  MEDICAL RECORD NO.:  [id number]  LOCATION:  2503                         FACILITY:  [facility]  PHYSICIAN:  Thornton Park. Daphine Deutscher, MD  DATE OF BIRTH:  May 09, 1945  DATE OF ADMISSION:  11/10/2010 DATE OF DISCHARGE:  11/14/2010                              DISCHARGE SUMMARY   ADMISSION DIAGNOSES: 1. Cholelithiasis with cholecystitis. 2. 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 3. Hypertension. 4. Type 2 diabetes mellitus. 5. Dyslipidemia.  DISCHARGE DIAGNOSES: 1. Cholelithiasis with cholecystitis. 2. 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 3. Hypertension. 4. Type 2 diabetes mellitus. 5. Dyslipidemia.  PROCEDURES:  Laparoscopic cholecystectomy, intraoperative arteriogram on November 12, 2010.  BRIEF HISTORY:  The patient is a 65 year old white female who was admitted to the hospital on November 10, 2010, complaining of abdominal pain.  She reports around 6 p.m. she had midabdominal pain going to her right side.  She developed nausea and vomiting, which was ongoing until 4 a.m.  She came to the emergency room at 3 a.m. where she underwent evaluation.  Labs showed the electrolytes were normal.  BUN was 13, creatinine was 0.57.  LFTs were all within normal limits.  White count was 16,900. Abdominal  ultrasound was obtained, showed tiny gallstones with sludge, there is a question of stone in the neck of the gallbladder.  The gallbladder wall is 8 mm with positive Murphy sign, common bile duct is 5 mm.  PAST MEDICAL HISTORY:  Includes coronary artery bypass graft in January 2012 with ongoing cardiac issues; hypertension; diabetes; and dyslipidemia.  For further history and physical, please see the dictated note.  HOSPITAL COURSE:  The patient was seen in consultation after admission by [facility] cardiologist. They also cleared her for surgery. Her Plavix was  held.  She was placed on IV antibiotics.  She received cardiac clearance on November 11, 2010, and after 3 days off Plavix, she was taken to OR on November 12, 2010, and underwent laparoscopic cholecystectomy IOC.  She tolerated the procedure well.  She was mobilized and was ready for discharge on November 13, 2010.  She was discharged home on preadmission medicines, Plavix 75 mg daily, Onglyza 5 mg daily, Tylenol p.r.n. for pain, and Colace 100 b.i.d. p.r.n., home insulin, pravastatin 40 mg daily.  She is scheduled to return for follow up in 2 weeks in the office.  She was given postoperative  laparoscopic cholecystectomy instructions.  CONDITION ON DISCHARGE:  Improving.     Eber Hong, P.A.   ______________________________ Thornton Park Daphine Deutscher, MD    WDJ/MEDQ  D:  12/25/2010  T:  12/25/2010  Job:  [id number]  cc:   [facility]  Electronically Signed by Sherrie George P.A. on 12/26/2010 10:00:47 PM Electronically Signed by Luretha Murphy MD on 12/28/2010 05:52:51 AM

## 2011-01-31 ENCOUNTER — Ambulatory Visit: Payer: PRIVATE HEALTH INSURANCE | Admitting: Ophthalmology

## 2011-02-22 ENCOUNTER — Ambulatory Visit: Payer: PRIVATE HEALTH INSURANCE | Admitting: Ophthalmology

## 2011-03-09 ENCOUNTER — Encounter: Payer: Self-pay | Admitting: Cardiology

## 2011-03-09 ENCOUNTER — Ambulatory Visit (INDEPENDENT_AMBULATORY_CARE_PROVIDER_SITE_OTHER): Payer: Medicare Other | Admitting: Cardiology

## 2011-03-09 VITALS — BP 138/76 | HR 70 | Ht 66.0 in | Wt 176.0 lb

## 2011-03-09 DIAGNOSIS — E119 Type 2 diabetes mellitus without complications: Secondary | ICD-10-CM

## 2011-03-09 DIAGNOSIS — E785 Hyperlipidemia, unspecified: Secondary | ICD-10-CM

## 2011-03-09 DIAGNOSIS — I6529 Occlusion and stenosis of unspecified carotid artery: Secondary | ICD-10-CM

## 2011-03-09 DIAGNOSIS — I2510 Atherosclerotic heart disease of native coronary artery without angina pectoris: Secondary | ICD-10-CM

## 2011-03-09 NOTE — Patient Instructions (Addendum)
Your physician wants you to follow-up in: ONE YEAR You will receive a reminder letter in the mail two months in advance. If you don't receive a letter, please call our office to schedule the follow-up appointment.   Your physician has requested that you have a carotid duplex. This test is an ultrasound of the carotid arteries in your neck. It looks at blood flow through these arteries that supply the brain with blood. Allow one hour for this exam. There are no restrictions or special instructions.

## 2011-03-09 NOTE — Assessment & Plan Note (Signed)
She reports that her last A1c was less than 6.5.  Per [name], MD.

## 2011-03-09 NOTE — Assessment & Plan Note (Signed)
I will defer to [facility].  Unfortunately she has been intolerant of statins.

## 2011-03-09 NOTE — Progress Notes (Signed)
   HPI The patient presents for followup of coronary disease status post CABG. Since I last saw her she has had a cholecystectomy.  Since that surgery she has done well.  The patient denies any new symptoms such as chest discomfort, neck or arm discomfort. There has been no new shortness of breath, PND or orthopnea. There have been no reported palpitations, presyncope or syncope.  She completed cardiac rehab.    Allergies  Allergen Reactions  . Aspirin   . Codeine   . Demerol   . Meperidine Hcl   . Prednisone     Current Outpatient Prescriptions  Medication Sig Dispense Refill  . acetaminophen (TYLENOL) 325 MG tablet Take 650 mg by mouth as needed.        . Cyanocobalamin (VITAMIN B-12 PO) Take 1 tablet by mouth daily.        . insulin glargine (LANTUS) 100 UNIT/ML injection Inject 10 Units into the skin as directed.       . Omega-3 Fatty Acids (OMEGA 3 PO) Take 1,200 Units by mouth daily.          Past Medical History  Diagnosis Date  . CAD (coronary artery disease)     S/P CABG January 2012  . S/P cardiac catheterization 03/2010    EF 50%   . Orthostatic hypotension     Midodrine therapy  . DM (diabetes mellitus)     Last A1c 14  . Hypothyroidism     Past Surgical History  Procedure Date  . Coronary artery bypass graft 03/2010  . Cardiac catheterization 03/2010  . Carotid dopplers 03/2010    40-59% bilateral ICA stenosis  . Appendectomy   . Tonsillectomy   . Cholecystectomy     ROS:  As stated in the HPI and negative for all other systems.  PHYSICAL EXAM BP 138/76  Pulse 70  Ht 5\' 6"  (1.676 m)  Wt 176 lb (79.833 kg)  BMI 28.41 kg/m2 GENERAL:  Well appearing HEENT:  Pupils equal round and reactive, fundi not visualized, oral mucosa unremarkable NECK:  No jugular venous distention, waveform within normal limits, carotid upstroke brisk and symmetric, no bruits, no thyromegaly LYMPHATICS:  No cervical, inguinal adenopathy LUNGS:  Clear to auscultation  bilaterally BACK:  No CVA tenderness CHEST:  Well healed sternotomy scar. HEART:  PMI not displaced or sustained,S1 and S2 within normal limits, no S3, no S4, no clicks, no rubs, no murmurs ABD:  Flat, positive bowel sounds normal in frequency in pitch, no bruits, no rebound, no guarding, no midline pulsatile mass, no hepatomegaly, no splenomegaly EXT:  2 plus pulses throughout, no edema, no cyanosis no clubbing SKIN:  No rashes no nodules NEURO:  Cranial nerves II through XII grossly intact, motor grossly intact throughout PSYCH:  Cognitively intact, oriented to person place and time   EKG:  Sinus rhythm, rate 72, LAD, intervals within normal limits, no acute ST-T wave changes. temporal arteritis   ASSESSMENT AND PLAN

## 2011-03-09 NOTE — Assessment & Plan Note (Addendum)
The patient has no new sypmtoms.  No further cardiovascular testing is indicated.  We will continue with aggressive risk reduction and meds as listed.  

## 2011-03-09 NOTE — Assessment & Plan Note (Signed)
She has follow up this in February.  I will schedule this today.

## 2011-03-10 ENCOUNTER — Other Ambulatory Visit (INDEPENDENT_AMBULATORY_CARE_PROVIDER_SITE_OTHER): Payer: Self-pay | Admitting: General Surgery

## 2011-03-14 ENCOUNTER — Ambulatory Visit: Payer: PRIVATE HEALTH INSURANCE | Admitting: Vascular Surgery

## 2011-03-14 HISTORY — PX: ANGIOPLASTY / STENTING FEMORAL: SUR30

## 2011-04-05 NOTE — Telephone Encounter (Signed)
RX WAS FILLED 03-17-11 PER PHARMACY RECORDS/GY

## 2011-05-09 ENCOUNTER — Encounter (INDEPENDENT_AMBULATORY_CARE_PROVIDER_SITE_OTHER): Payer: Medicare HMO

## 2011-05-09 ENCOUNTER — Encounter: Payer: Medicare Other | Admitting: *Deleted

## 2011-05-09 DIAGNOSIS — I6529 Occlusion and stenosis of unspecified carotid artery: Secondary | ICD-10-CM

## 2011-10-02 ENCOUNTER — Encounter (HOSPITAL_COMMUNITY): Payer: Self-pay | Admitting: Nurse Practitioner

## 2011-10-02 ENCOUNTER — Observation Stay (HOSPITAL_COMMUNITY)
Admission: EM | Admit: 2011-10-02 | Discharge: 2011-10-04 | Disposition: A | Discharge status: Home / Self Care | Payer: Medicare HMO | Attending: Internal Medicine | Admitting: Internal Medicine

## 2011-10-02 DIAGNOSIS — R079 Chest pain, unspecified: Secondary | ICD-10-CM

## 2011-10-02 DIAGNOSIS — I951 Orthostatic hypotension: Secondary | ICD-10-CM

## 2011-10-02 DIAGNOSIS — R110 Nausea: Secondary | ICD-10-CM | POA: Insufficient documentation

## 2011-10-02 DIAGNOSIS — I2510 Atherosclerotic heart disease of native coronary artery without angina pectoris: Secondary | ICD-10-CM

## 2011-10-02 DIAGNOSIS — K567 Ileus, unspecified: Secondary | ICD-10-CM

## 2011-10-02 DIAGNOSIS — M549 Dorsalgia, unspecified: Secondary | ICD-10-CM | POA: Insufficient documentation

## 2011-10-02 DIAGNOSIS — E119 Type 2 diabetes mellitus without complications: Secondary | ICD-10-CM

## 2011-10-02 DIAGNOSIS — M109 Gout, unspecified: Secondary | ICD-10-CM

## 2011-10-02 DIAGNOSIS — E785 Hyperlipidemia, unspecified: Secondary | ICD-10-CM

## 2011-10-02 DIAGNOSIS — Z951 Presence of aortocoronary bypass graft: Secondary | ICD-10-CM

## 2011-10-02 DIAGNOSIS — E039 Hypothyroidism, unspecified: Secondary | ICD-10-CM

## 2011-10-02 DIAGNOSIS — J189 Pneumonia, unspecified organism: Principal | ICD-10-CM

## 2011-10-02 DIAGNOSIS — R109 Unspecified abdominal pain: Secondary | ICD-10-CM | POA: Insufficient documentation

## 2011-10-02 DIAGNOSIS — R630 Anorexia: Secondary | ICD-10-CM | POA: Insufficient documentation

## 2011-10-02 DIAGNOSIS — I6529 Occlusion and stenosis of unspecified carotid artery: Secondary | ICD-10-CM

## 2011-10-02 DIAGNOSIS — R197 Diarrhea, unspecified: Secondary | ICD-10-CM

## 2011-10-02 LAB — CBC WITH DIFFERENTIAL/PLATELET
Basophils Absolute: 0 10*3/uL (ref 0.0–0.1)
Basophils Relative: 0 % (ref 0–1)
Eosinophils Absolute: 0.1 10*3/uL (ref 0.0–0.7)
Eosinophils Relative: 1 % (ref 0–5)
HCT: 36.7 % (ref 36.0–46.0)
Hemoglobin: 12.9 g/dL (ref 12.0–15.0)
Lymphocytes Relative: 12 % (ref 12–46)
Lymphs Abs: 1.3 10*3/uL (ref 0.7–4.0)
MCH: 29.7 pg (ref 26.0–34.0)
MCHC: 35.1 g/dL (ref 30.0–36.0)
MCV: 84.4 fL (ref 78.0–100.0)
Monocytes Absolute: 1.4 10*3/uL — ABNORMAL HIGH (ref 0.1–1.0)
Monocytes Relative: 13 % — ABNORMAL HIGH (ref 3–12)
Neutro Abs: 8 10*3/uL — ABNORMAL HIGH (ref 1.7–7.7)
Neutrophils Relative %: 74 % (ref 43–77)
Platelets: 178 10*3/uL (ref 150–400)
RBC: 4.35 MIL/uL (ref 3.87–5.11)
RDW: 12.2 % (ref 11.5–15.5)
WBC: 10.7 10*3/uL — ABNORMAL HIGH (ref 4.0–10.5)

## 2011-10-02 LAB — POCT I-STAT, CHEM 8
BUN: 22 mg/dL (ref 6–23)
Calcium, Ion: 1.11 mmol/L — ABNORMAL LOW (ref 1.13–1.30)
Chloride: 101 mEq/L (ref 96–112)
Creatinine, Ser: 0.8 mg/dL (ref 0.50–1.10)
Glucose, Bld: 149 mg/dL — ABNORMAL HIGH (ref 70–99)
HCT: 36 % (ref 36.0–46.0)
Hemoglobin: 12.2 g/dL (ref 12.0–15.0)
Potassium: 3.7 mEq/L (ref 3.5–5.1)
Sodium: 137 mEq/L (ref 135–145)
TCO2: 24 mmol/L (ref 0–100)

## 2011-10-02 LAB — CLOSTRIDIUM DIFFICILE BY PCR: Toxigenic C. Difficile by PCR: NEGATIVE

## 2011-10-02 MED ORDER — ALBUTEROL SULFATE (5 MG/ML) 0.5% IN NEBU
INHALATION_SOLUTION | RESPIRATORY_TRACT | Status: AC
  Filled 2011-10-02: qty 1

## 2011-10-02 MED ORDER — IPRATROPIUM BROMIDE 0.02 % IN SOLN
RESPIRATORY_TRACT | Status: AC
  Filled 2011-10-02: qty 2.5

## 2011-10-02 MED ORDER — DIPHENOXYLATE-ATROPINE 2.5-0.025 MG PO TABS
ORAL_TABLET | ORAL | Status: AC
  Filled 2011-10-02: qty 2

## 2011-10-02 MED ORDER — SODIUM CHLORIDE 0.9 % IV BOLUS (SEPSIS)
1000.0000 mL | Freq: Once | INTRAVENOUS | Status: AC
  Administered 2011-10-02: 1000 mL via INTRAVENOUS

## 2011-10-02 NOTE — ED Notes (Signed)
C/o flank/lower back pain, diarrhea, decreased appetite since Tuesday. States she tries to eat but is too nauseated and continues to have diarrhea

## 2011-10-02 NOTE — ED Provider Notes (Signed)
History     CSN: [id number]  Arrival date & time 10/02/11  1054   First MD Initiated Contact with Patient 10/02/11 1140     12:44 PM HPI Patient reports 6 days of diarrhea, anorexia, low back pain, abdominal pain and nausea reports however her diarrhea is so frequent and watery and she feels that she is becoming weak due to it. Reports mild fatigue. Denies chest pain, urinary symptoms, shortness of breath, fever. Denies recent foreign travel, sick contacts, food contacts  Patient is a 66 y.o. female presenting with abdominal pain.  Abdominal Pain The primary symptoms of the illness include abdominal pain, fatigue, nausea and diarrhea. The primary symptoms of the illness do not include fever, shortness of breath, vomiting, hematemesis, hematochezia, dysuria or vaginal discharge. The onset of the illness was gradual. The problem has been gradually worsening.  The patient states that she believes she is currently not pregnant. The patient has had a change in bowel habit. Additional symptoms associated with the illness include anorexia and back pain. Symptoms associated with the illness do not include chills, diaphoresis, constipation, urgency, hematuria or frequency.    Past Medical History  Diagnosis Date  . CAD (coronary artery disease)     S/P CABG January 2012  . S/P cardiac catheterization 03/2010    EF 50%   . Orthostatic hypotension     Midodrine therapy  . DM (diabetes mellitus)     Last A1c 14    Past Surgical History  Procedure Date  . Coronary artery bypass graft 03/2010  . Cardiac catheterization 03/2010  . Carotid dopplers 03/2010    40-59% bilateral ICA stenosis  . Appendectomy   . Tonsillectomy   . Cholecystectomy     Family History  Problem Relation Age of Onset  . Esophageal cancer Mother   . Heart attack Father     MI    History  Substance Use Topics  . Smoking status: Never Smoker   . Smokeless tobacco: Never Used  . Alcohol Use: No    OB History    Grav Para Term Preterm Abortions TAB SAB Ect Mult Living                  Review of Systems  Constitutional: Positive for fatigue. Negative for fever, chills and diaphoresis.  Respiratory: Negative for shortness of breath.   Cardiovascular: Negative for chest pain.  Gastrointestinal: Positive for nausea, abdominal pain, diarrhea and anorexia. Negative for vomiting, constipation, hematochezia and hematemesis.  Genitourinary: Negative for dysuria, urgency, frequency, hematuria, flank pain, vaginal discharge and vaginal pain.  Musculoskeletal: Positive for back pain.  Skin: Negative for wound.  All other systems reviewed and are negative.    Allergies  Bee venom; Aspirin; Codeine; Demerol; Meperidine hcl; and Prednisone  Home Medications   Current Outpatient Rx  Name Route Sig Dispense Refill  . ACETAMINOPHEN 325 MG PO TABS Oral Take 650 mg by mouth as needed. For pain    . INSULIN GLARGINE 100 UNIT/ML  SOLN Subcutaneous Inject 12-20 Units into the skin at bedtime. Based on sugar levels    . OMEGA 3 PO Oral Take 1,200 Units by mouth daily.      Frazier Butt OP Both Eyes Place 1 drop into both eyes as needed. For dry eyes      BP 118/61  Pulse 97  Temp 98.4 F (36.9 C) (Oral)  SpO2 93%  Physical Exam  Vitals reviewed. Constitutional: She is oriented to person, place, and time.  Vital signs are normal. She appears well-developed and well-nourished.  HENT:  Head: Normocephalic and atraumatic.  Eyes: Conjunctivae are normal. Pupils are equal, round, and reactive to light.  Neck: Normal range of motion. Neck supple.  Cardiovascular: Normal rate, regular rhythm and normal heart sounds.  Exam reveals no friction rub.   No murmur heard. Pulmonary/Chest: Effort normal and breath sounds normal. She has no wheezes. She has no rhonchi. She has no rales. She exhibits no tenderness.  Abdominal: Soft. Bowel sounds are normal. She exhibits no distension and no mass. There is tenderness  (lower abdominal pain). There is no rebound, no guarding and no CVA tenderness.  Musculoskeletal: Normal range of motion.  Neurological: She is alert and oriented to person, place, and time. Coordination normal.  Skin: Skin is warm and dry. No rash noted. No erythema. No pallor.    ED Course  Procedures  Results for orders placed during the hospital encounter of 10/02/11  CBC WITH DIFFERENTIAL      Component Value Range   WBC 10.7 (*) 4.0 - 10.5 K/uL   RBC 4.35  3.87 - 5.11 MIL/uL   Hemoglobin 12.9  12.0 - 15.0 g/dL   HCT 16.1  09.6 - 04.5 %   MCV 84.4  78.0 - 100.0 fL   MCH 29.7  26.0 - 34.0 pg   MCHC 35.1  30.0 - 36.0 g/dL   RDW 40.9  81.1 - 91.4 %   Platelets 178  150 - 400 K/uL   Neutrophils Relative 74  43 - 77 %   Neutro Abs 8.0 (*) 1.7 - 7.7 K/uL   Lymphocytes Relative 12  12 - 46 %   Lymphs Abs 1.3  0.7 - 4.0 K/uL   Monocytes Relative 13 (*) 3 - 12 %   Monocytes Absolute 1.4 (*) 0.1 - 1.0 K/uL   Eosinophils Relative 1  0 - 5 %   Eosinophils Absolute 0.1  0.0 - 0.7 K/uL   Basophils Relative 0  0 - 1 %   Basophils Absolute 0.0  0.0 - 0.1 K/uL  CLOSTRIDIUM DIFFICILE BY PCR      Component Value Range   C difficile by pcr NEGATIVE  NEGATIVE  POCT I-STAT, CHEM 8      Component Value Range   Sodium 137  135 - 145 mEq/L   Potassium 3.7  3.5 - 5.1 mEq/L   Chloride 101  96 - 112 mEq/L   BUN 22  6 - 23 mg/dL   Creatinine, Ser 7.82  0.50 - 1.10 mg/dL   Glucose, Bld 956 (*) 70 - 99 mg/dL   Calcium, Ion 2.13 (*) 1.13 - 1.30 mmol/L   TCO2 24  0 - 100 mmol/L   Hemoglobin 12.2  12.0 - 15.0 g/dL   HCT 08.6  57.8 - 46.9 %  GLUCOSE, CAPILLARY      Component Value Range   Glucose-Capillary 101 (*) 70 - 99 mg/dL   Comment 1 Notify RN     Comment 2 Documented in Chart    HEPATIC FUNCTION PANEL      Component Value Range   Total Protein 6.9  6.0 - 8.3 g/dL   Albumin 2.9 (*) 3.5 - 5.2 g/dL   AST 18  0 - 37 U/L   ALT 13  0 - 35 U/L   Alkaline Phosphatase 45  39 - 117 U/L    Total Bilirubin 0.4  0.3 - 1.2 mg/dL   Bilirubin, Direct <6.2  0.0 -  0.3 mg/dL   Indirect Bilirubin NOT CALCULATED  0.3 - 0.9 mg/dL  LIPASE, BLOOD      Component Value Range   Lipase 33  11 - 59 U/L  CBC      Component Value Range   WBC 8.1  4.0 - 10.5 K/uL   RBC 3.58 (*) 3.87 - 5.11 MIL/uL   Hemoglobin 10.5 (*) 12.0 - 15.0 g/dL   HCT 16.1 (*) 09.6 - 04.5 %   MCV 83.8  78.0 - 100.0 fL   MCH 29.3  26.0 - 34.0 pg   MCHC 35.0  30.0 - 36.0 g/dL   RDW 40.9  81.1 - 91.4 %   Platelets 167  150 - 400 K/uL  BASIC METABOLIC PANEL      Component Value Range   Sodium 135  135 - 145 mEq/L   Potassium 3.3 (*) 3.5 - 5.1 mEq/L   Chloride 101  96 - 112 mEq/L   CO2 21  19 - 32 mEq/L   Glucose, Bld 151 (*) 70 - 99 mg/dL   BUN 12  6 - 23 mg/dL   Creatinine, Ser 7.82  0.50 - 1.10 mg/dL   Calcium 8.2 (*) 8.4 - 10.5 mg/dL   GFR calc non Af Amer >90  >90 mL/min   GFR calc Af Amer >90  >90 mL/min  GLUCOSE, CAPILLARY      Component Value Range   Glucose-Capillary 142 (*) 70 - 99 mg/dL   Comment 1 Documented in Chart     Comment 2 Notify RN      MDM  Patient signed out to Remi Haggard PA-C in the CDU and Dr. Ethelda Chick. Pending CT to r/o diverticulitis, or other acute process since patient is TTP in lower abdominal Discussed with pt and family who agree with plan        Thomasene Lot, PA-C 10/03/11 1030

## 2011-10-03 ENCOUNTER — Emergency Department (HOSPITAL_COMMUNITY): Payer: Medicare HMO

## 2011-10-03 ENCOUNTER — Encounter (HOSPITAL_COMMUNITY): Payer: Self-pay | Admitting: Radiology

## 2011-10-03 DIAGNOSIS — J189 Pneumonia, unspecified organism: Secondary | ICD-10-CM | POA: Diagnosis present

## 2011-10-03 DIAGNOSIS — E119 Type 2 diabetes mellitus without complications: Secondary | ICD-10-CM

## 2011-10-03 DIAGNOSIS — K567 Ileus, unspecified: Secondary | ICD-10-CM | POA: Diagnosis present

## 2011-10-03 DIAGNOSIS — R197 Diarrhea, unspecified: Secondary | ICD-10-CM | POA: Diagnosis present

## 2011-10-03 LAB — GLUCOSE, CAPILLARY
Glucose-Capillary: 101 mg/dL — ABNORMAL HIGH (ref 70–99)
Glucose-Capillary: 95 mg/dL (ref 70–99)
Glucose-Capillary: 142 mg/dL — ABNORMAL HIGH (ref 70–99)
Glucose-Capillary: 158 mg/dL — ABNORMAL HIGH (ref 70–99)
Glucose-Capillary: 153 mg/dL — ABNORMAL HIGH (ref 70–99)
Glucose-Capillary: 129 mg/dL — ABNORMAL HIGH (ref 70–99)

## 2011-10-03 LAB — BASIC METABOLIC PANEL
BUN: 12 mg/dL (ref 6–23)
CO2: 21 mEq/L (ref 19–32)
Calcium: 8.2 mg/dL — ABNORMAL LOW (ref 8.4–10.5)
Chloride: 101 mEq/L (ref 96–112)
Creatinine, Ser: 0.55 mg/dL (ref 0.50–1.10)
GFR calc Af Amer: >90 mL/min (ref >90)
GFR calc non Af Amer: >90 mL/min (ref >90)
Glucose, Bld: 151 mg/dL — ABNORMAL HIGH (ref 70–99)
Potassium: 3.3 mEq/L — ABNORMAL LOW (ref 3.5–5.1)
Sodium: 135 mEq/L (ref 135–145)

## 2011-10-03 LAB — HEPATIC FUNCTION PANEL
ALT: 13 U/L (ref 0–35)
AST: 18 U/L (ref 0–37)
Albumin: 2.9 g/dL — ABNORMAL LOW (ref 3.5–5.2)
Alkaline Phosphatase: 45 U/L (ref 39–117)
Bilirubin, Direct: <0.1 mg/dL (ref 0.0–0.3)
Total Bilirubin: 0.4 mg/dL (ref 0.3–1.2)
Total Protein: 6.9 g/dL (ref 6.0–8.3)

## 2011-10-03 LAB — CBC
HCT: 30 % — ABNORMAL LOW (ref 36.0–46.0)
Hemoglobin: 10.5 g/dL — ABNORMAL LOW (ref 12.0–15.0)
MCH: 29.3 pg (ref 26.0–34.0)
MCHC: 35 g/dL (ref 30.0–36.0)
MCV: 83.8 fL (ref 78.0–100.0)
Platelets: 167 10*3/uL (ref 150–400)
RBC: 3.58 MIL/uL — ABNORMAL LOW (ref 3.87–5.11)
RDW: 12.2 % (ref 11.5–15.5)
WBC: 8.1 10*3/uL (ref 4.0–10.5)

## 2011-10-03 LAB — LIPASE, BLOOD: Lipase: 33 U/L (ref 11–59)

## 2011-10-03 MED ORDER — KCL IN DEXTROSE-NACL 20-5-0.9 MEQ/L-%-% IV SOLN
INTRAVENOUS | Status: DC
  Administered 2011-10-03 – 2011-10-04 (×3): via INTRAVENOUS
  Filled 2011-10-03 (×5): qty 1000

## 2011-10-03 MED ORDER — INSULIN GLARGINE 100 UNIT/ML ~~LOC~~ SOLN
SUBCUTANEOUS | Status: AC
  Filled 2011-10-03: qty 1

## 2011-10-03 MED ORDER — IOHEXOL 300 MG/ML  SOLN
100.0000 mL | Freq: Once | INTRAMUSCULAR | Status: AC | PRN
  Administered 2011-10-03: 100 mL via INTRAVENOUS

## 2011-10-03 MED ORDER — DEXTROSE 5 % IV SOLN
INTRAVENOUS | Status: AC
  Filled 2011-10-03: qty 10

## 2011-10-03 MED ORDER — DEXTROSE 5 % IV SOLN
500.0000 mg | INTRAVENOUS | Status: DC
  Administered 2011-10-04: 500 mg via INTRAVENOUS
  Filled 2011-10-03: qty 500

## 2011-10-03 MED ORDER — SACCHAROMYCES BOULARDII 250 MG PO CAPS
250.0000 mg | ORAL_CAPSULE | Freq: Two times a day (BID) | ORAL | Status: DC
  Administered 2011-10-03 – 2011-10-04 (×3): 250 mg via ORAL
  Filled 2011-10-03 (×4): qty 1

## 2011-10-03 MED ORDER — ACETAMINOPHEN 325 MG PO TABS
650.0000 mg | ORAL_TABLET | Freq: Four times a day (QID) | ORAL | Status: DC | PRN
  Administered 2011-10-03: 650 mg via ORAL
  Filled 2011-10-03: qty 2

## 2011-10-03 MED ORDER — INSULIN ASPART 100 UNIT/ML ~~LOC~~ SOLN: 0.0000 [IU] | Freq: Every day | SUBCUTANEOUS | Status: DC

## 2011-10-03 MED ORDER — INSULIN ASPART 100 UNIT/ML ~~LOC~~ SOLN
0.0000 [IU] | Freq: Three times a day (TID) | SUBCUTANEOUS | Status: DC
  Administered 2011-10-03: 1 [IU] via SUBCUTANEOUS
  Administered 2011-10-03 – 2011-10-04 (×3): 2 [IU] via SUBCUTANEOUS

## 2011-10-03 MED ORDER — ENOXAPARIN SODIUM 40 MG/0.4ML ~~LOC~~ SOLN
40.0000 mg | SUBCUTANEOUS | Status: DC
  Administered 2011-10-03: 40 mg via SUBCUTANEOUS
  Filled 2011-10-03 (×2): qty 0.4

## 2011-10-03 MED ORDER — DEXTROSE 5 % IV SOLN
INTRAVENOUS | Status: AC
  Administered 2011-10-03: 02:00:00
  Filled 2011-10-03: qty 500

## 2011-10-03 MED ORDER — INSULIN GLARGINE 100 UNIT/ML ~~LOC~~ SOLN
10.0000 [IU] | Freq: Every day | SUBCUTANEOUS | Status: DC
  Administered 2011-10-03: 10 [IU] via SUBCUTANEOUS

## 2011-10-03 MED ORDER — DEXTROSE 5 % IV SOLN
1.0000 g | INTRAVENOUS | Status: DC
  Administered 2011-10-03: 1 g via INTRAVENOUS
  Filled 2011-10-03 (×2): qty 10

## 2011-10-03 NOTE — ED Provider Notes (Signed)
Medical screening examination/treatment/procedure(s) were performed by non-physician practitioner and as supervising physician I was immediately available for consultation/collaboration.  Juliet Rude. Rubin Payor, MD 10/03/11 1348

## 2011-10-03 NOTE — Progress Notes (Signed)
Patient arrived from ED to 5500.  She is AOx4.  From home alone.  Skin has a few abrasions from scratching but otherwise intact.  VS stable.  She is oriented to unit, call bell system, and fall prevention.  She acknowledges understanding and willingness to comply.  Safety video viewed.  Contact precautions for diarrhea.  C. Diff negative but awaiting cultures.  POC: IV antibiotic administration.  Will continue to monitor patient progress.

## 2011-10-03 NOTE — Progress Notes (Signed)
Utilization review complete 

## 2011-10-03 NOTE — Progress Notes (Signed)
PATIENT DETAILS Name: [name] Age: 66 y.o. Sex: female Date of Birth: 11-Mar-1946 Admit Date: 10/02/2011 Admitting Physician Houston Siren, MD [id number]  Subjective: Admitted with diarrhea-for around 5-6 days  Assessment/Plan: Principal Problem:  *PNA (pneumonia) -C/w Rocephin and Zithromz-started 7/22 -afebrile  Active Problems: Diarrhea -?enteritis-on prelim CT abd/pelvis-await official reading -Stool C diff PCR neg, await culture -will add Flagyl and Florastor -advance to full liquids   DM -SSI and Lantus-will slowly increase as diet increase -CBG's stable   CAD -S/P CABG-2012 -stable at present  Disposition: Remain inpatient  DVT Prophylaxis: Prophylactic Lovenox  Code Status: Full code   Procedures:  None  CONSULTS:  None  PHYSICAL EXAM: Vital signs in last 24 hours: Filed Vitals:   10/02/11 1330 10/02/11 1530 10/03/11 0053 10/03/11 0602  BP: 120/85 118/70 155/73 145/77  Pulse: 85 80 90 78  Temp:   98.5 F (36.9 C) 98.7 F (37.1 C)  TempSrc:   Oral Oral  Resp: 20 18 16 17   Height:   5\' 6"  (1.676 m)   Weight:   87.4 kg (192 lb 10.9 oz)   SpO2: 96% 97% 94% 92%    Weight change:  Body mass index is 31.10 kg/(m^2).   Gen Exam: Awake and alert with clear speech.   Neck: Supple, No JVD.  Chest: B/L Clear.   CVS: S1 S2 Regular, no murmurs.  Abdomen: soft, BS +, non tender, non distended.  Extremities: no edema, lower extremities warm to touch. Neurologic: Non Focal.   Skin: No Rash.   Wounds: N/A.    Intake/Output from previous day:  Intake/Output Summary (Last 24 hours) at 10/03/11 1139 Last data filed at 10/03/11 0900  Gross per 24 hour  Intake    430 ml  Output    450 ml  Net    -20 ml     LAB RESULTS: CBC  Lab 10/03/11 0630 10/02/11 1312 10/02/11 1244  WBC 8.1 -- 10.7*  HGB 10.5* 12.2 12.9  HCT 30.0* 36.0 36.7  PLT 167 -- 178  MCV 83.8 -- 84.4  MCH 29.3 -- 29.7  MCHC 35.0 -- 35.1  RDW 12.2 -- 12.2    LYMPHSABS -- -- 1.3  MONOABS -- -- 1.4*  EOSABS -- -- 0.1  BASOSABS -- -- 0.0  BANDABS -- -- --    Chemistries   Lab 10/03/11 0630 10/02/11 1312  NA 135 137  K 3.3* 3.7  CL 101 101  CO2 21 --  GLUCOSE 151* 149*  BUN 12 22  CREATININE 0.55 0.80  CALCIUM 8.2* --  MG -- --    CBG:  Lab 10/03/11 0748 10/03/11 0114 10/02/11 2235  GLUCAP 142* 95 101*    GFR Estimated Creatinine Clearance: 74.9 ml/min (by C-G formula based on Cr of 0.55).  Coagulation profile No results found for this basename: INR:5,PROTIME:5 in the last 168 hours  Cardiac Enzymes No results found for this basename: CK:3,CKMB:3,TROPONINI:3,MYOGLOBIN:3 in the last 168 hours  No components found with this basename: POCBNP:3 No results found for this basename: DDIMER:2 in the last 72 hours No results found for this basename: HGBA1C:2 in the last 72 hours No results found for this basename: CHOL:2,HDL:2,LDLCALC:2,TRIG:2,CHOLHDL:2,LDLDIRECT:2 in the last 72 hours No results found for this basename: TSH,T4TOTAL,FREET3,T3FREE,THYROIDAB in the last 72 hours No results found for this basename: VITAMINB12:2,FOLATE:2,FERRITIN:2,TIBC:2,IRON:2,RETICCTPCT:2 in the last 72 hours  Basename 10/02/11 2305  LIPASE 33  AMYLASE --    Urine Studies No results found for this basename: UACOL:2,UAPR:2,USPG:2,UPH:2,UTP:2,UGL:2,UKET:2,UBIL:2,UHGB:2,UNIT:2,UROB:2,ULEU:2,UEPI:2,UWBC:2,URBC:2,UBAC:2,CAST:2,CRYS:2,UCOM:2,BILUA:2 in the  last 72 hours  MICROBIOLOGY: Recent Results (from the past 240 hour(s))  CLOSTRIDIUM DIFFICILE BY PCR     Status: Normal   Collection Time   10/02/11  2:38 PM      Component Value Range Status Comment   C difficile by pcr NEGATIVE  NEGATIVE Final     RADIOLOGY STUDIES/RESULTS: No results found.  MEDICATIONS: Scheduled Meds:   . albuterol      . azithromycin  500 mg Intravenous Q24H  . cefTRIAXone (ROCEPHIN)  IV  1 g Intravenous Q24H  . azithromycin (ZITHROMAX) 500 MG IVPB      .  cefTRIAXone (ROCEPHIN) IVPB 1 gram/50 mL D5W      . diphenoxylate-atropine      . insulin aspart  0-5 Units Subcutaneous QHS  . insulin aspart  0-9 Units Subcutaneous TID WC  . insulin glargine      . insulin glargine  10 Units Subcutaneous QHS  . ipratropium      . saccharomyces boulardii  250 mg Oral BID  . sodium chloride  1,000 mL Intravenous Once   Continuous Infusions:   . dextrose 5 % and 0.9 % NaCl with KCl 20 mEq/L 100 mL/hr at 10/03/11 0518   PRN Meds:.acetaminophen, iohexol  Antibiotics: Anti-infectives     Start     Dose/Rate Route Frequency Ordered Stop   10/04/11 0000   azithromycin (ZITHROMAX) 500 mg in dextrose 5 % 250 mL IVPB        500 mg 250 mL/hr over 60 Minutes Intravenous Every 24 hours 10/03/11 0306     10/03/11 2200   cefTRIAXone (ROCEPHIN) 1 g in dextrose 5 % 50 mL IVPB        1 g 100 mL/hr over 30 Minutes Intravenous Every 24 hours 10/03/11 0306     10/03/11 0007   dextrose 5 % with cefTRIAXone (ROCEPHIN) ADS Med     Comments: COOK, JONATHAN: cabinet override         10/03/11 0007 10/03/11 1214   10/03/11 0007   dextrose 5 % with azithromycin (ZITHROMAX) ADS Med     Comments: Ella Bodo: cabinet override         10/03/11 0007 10/03/11 0145           Jeoffrey Massed, MD  [facility] Pager:[phone]  If 7PM-7AM, please contact night-coverage [email or web address] Password [facility] 10/03/2011, 11:39 AM   LOS: 1 day

## 2011-10-03 NOTE — Progress Notes (Signed)
MD, Please address VTE.  Thanks

## 2011-10-04 DIAGNOSIS — J189 Pneumonia, unspecified organism: Secondary | ICD-10-CM

## 2011-10-04 DIAGNOSIS — E119 Type 2 diabetes mellitus without complications: Secondary | ICD-10-CM

## 2011-10-04 DIAGNOSIS — K560 Paralytic ileus: Secondary | ICD-10-CM

## 2011-10-04 DIAGNOSIS — R197 Diarrhea, unspecified: Secondary | ICD-10-CM

## 2011-10-04 LAB — OVA AND PARASITE EXAMINATION: Ova and parasites: NONE SEEN

## 2011-10-04 LAB — GLUCOSE, CAPILLARY
Glucose-Capillary: 164 mg/dL — ABNORMAL HIGH (ref 70–99)
Glucose-Capillary: 120 mg/dL — ABNORMAL HIGH (ref 70–99)

## 2011-10-04 LAB — HEMOGLOBIN A1C
Hgb A1c MFr Bld: 7.2 % — ABNORMAL HIGH (ref <5.7)
Mean Plasma Glucose: 160 mg/dL — ABNORMAL HIGH (ref <117)

## 2011-10-04 MED ORDER — LEVOFLOXACIN 750 MG PO TABS: 750.0000 mg | ORAL_TABLET | Freq: Every day | ORAL | Status: AC

## 2011-10-04 MED ORDER — SACCHAROMYCES BOULARDII 250 MG PO CAPS: 250.0000 mg | ORAL_CAPSULE | Freq: Two times a day (BID) | ORAL | Status: AC

## 2011-10-04 NOTE — Progress Notes (Signed)
Discharge instructions and prescriptions provided to patient.  Used teach back  Method to explain antibiotic use and management of diabetes, patient able to give return verbalization on use of antibiotic and managing diabetes.  IV site discontinued with catheter intact, patient discharged via wheelchair with family.  Tamer Baughman Terral

## 2011-10-04 NOTE — Discharge Summary (Signed)
PATIENT DETAILS Name: [name] Age: 66 y.o. Sex: female Date of Birth: November 12, 1945 MRN: [id number]. Admit Date: 10/02/2011 Admitting Physician: Houston Siren, MD [id number]  Recommendations for Outpatient Follow-up:  Needs a follow up Chest X ray in 6-8 weeks to document resolution of PNA  PRIMARY DISCHARGE DIAGNOSIS:  Principal Problem:  *PNA (pneumonia) Active Problems: Enteritis  DM  CAD  Diarrhea       PAST MEDICAL HISTORY: Past Medical History  Diagnosis Date  . CAD (coronary artery disease)     S/P CABG January 2012  . S/P cardiac catheterization 03/2010    EF 50%   . Orthostatic hypotension     Midodrine therapy  . DM (diabetes mellitus)     Last A1c 14    DISCHARGE MEDICATIONS: Medication List  As of 10/04/2011 11:56 AM   TAKE these medications         acetaminophen 325 MG tablet   Commonly known as: TYLENOL   Take 650 mg by mouth as needed. For pain      insulin glargine 100 UNIT/ML injection   Commonly known as: LANTUS   Inject 12-20 Units into the skin at bedtime. Based on sugar levels      levofloxacin 750 MG tablet   Commonly known as: LEVAQUIN   Take 1 tablet (750 mg total) by mouth daily.      OMEGA 3 PO   Take 1,200 Units by mouth daily.      saccharomyces boulardii 250 MG capsule   Commonly known as: FLORASTOR   Take 1 capsule (250 mg total) by mouth 2 (two) times daily.      SYSTANE OP   Place 1 drop into both eyes as needed. For dry eyes            BRIEF HPI:  See H&P, Labs, Consult and Test reports for all details in brief, patient was admitted for diarrhea for around 5-6 days, she also started having cough a few days prior to admission. She was evaluated in the ED-and a CT Scan of abdomen was done, which showed ?enteritis and PNA on the left lower lobe of the lung  CONSULTATIONS:   None  PERTINENT RADIOLOGIC STUDIES: Ct Abdomen Pelvis W Contrast  10/03/2011  *RADIOLOGY REPORT*  Clinical Data:  Right-sided abdominal  pain, nausea, vomiting and diarrhea.  CT ABDOMEN AND PELVIS WITH CONTRAST  Technique:  Multidetector CT imaging of the abdomen and pelvis was performed using the standard protocol following bolus administration of intravenous contrast.  Contrast: OMNIPAQUE IOHEXOL 300 MG/ML  SOLN 100 ml Omnipaque- 300 IV  Comparison:   None.  Findings:  The gallbladder has been removed.  No evidence of biliary dilatation.  The liver, pancreas, spleen, adrenal glands and kidneys are unremarkable.  The visualized lung bases show basilar density in the posterior left lower lobe which may be consistent with acute infiltrate. Correlation suggested with any respiratory symptoms.  No pleural fluid identified.  Bowel loops show no obstructive pattern.  Mild prominence of small bowel may be consistent with a mild ileus pattern.  The bladder is distended and unremarkable.  No hernias are identified.  No free air.  No abscess identified.  No masses or enlarged lymph nodes. Degenerative changes are present in the spine.  IMPRESSION: No acute process identified in the abdomen or pelvis.  There may be mild small bowel ileus/enteritis.  Left lower lobe pulmonary density suspicious for infiltrate.  Original Report Authenticated By: Reola Calkins, M.D.  PERTINENT LAB RESULTS: CBC:  Basename 10/03/11 0630 10/02/11 1312 10/02/11 1244  WBC 8.1 -- 10.7*  HGB 10.5* 12.2 --  HCT 30.0* 36.0 --  PLT 167 -- 178   CMET CMP     Component Value Date/Time   NA 135 10/03/2011 0630   K 3.3* 10/03/2011 0630   CL 101 10/03/2011 0630   CO2 21 10/03/2011 0630   GLUCOSE 151* 10/03/2011 0630   BUN 12 10/03/2011 0630   CREATININE 0.55 10/03/2011 0630   CALCIUM 8.2* 10/03/2011 0630   PROT 6.9 10/02/2011 2305   ALBUMIN 2.9* 10/02/2011 2305   AST 18 10/02/2011 2305   ALT 13 10/02/2011 2305   ALKPHOS 45 10/02/2011 2305   BILITOT 0.4 10/02/2011 2305   GFRNONAA >90 10/03/2011 0630   GFRAA >90 10/03/2011 0630    GFR Estimated Creatinine  Clearance: 74.9 ml/min (by C-G formula based on Cr of 0.55).  Basename 10/02/11 2305  LIPASE 33  AMYLASE --   No results found for this basename: CKTOTAL:3,CKMB:3,CKMBINDEX:3,TROPONINI:3 in the last 72 hours No components found with this basename: POCBNP:3 No results found for this basename: DDIMER:2 in the last 72 hours  Basename 10/03/11 1413  HGBA1C 7.2*   No results found for this basename: CHOL:2,HDL:2,LDLCALC:2,TRIG:2,CHOLHDL:2,LDLDIRECT:2 in the last 72 hours No results found for this basename: TSH,T4TOTAL,FREET3,T3FREE,THYROIDAB in the last 72 hours No results found for this basename: VITAMINB12:2,FOLATE:2,FERRITIN:2,TIBC:2,IRON:2,RETICCTPCT:2 in the last 72 hours Coags: No results found for this basename: PT:2,INR:2 in the last 72 hours Microbiology: Recent Results (from the past 240 hour(s))  CLOSTRIDIUM DIFFICILE BY PCR     Status: Normal   Collection Time   10/02/11  2:38 PM      Component Value Range Status Comment   C difficile by pcr NEGATIVE  NEGATIVE Final   STOOL CULTURE     Status: Normal (Preliminary result)   Collection Time   10/02/11  7:28 PM      Component Value Range Status Comment   Specimen Description STOOL   Final    Special Requests NONE   Final    Culture Culture reincubated for better growth   Final    Report Status PENDING   Incomplete      BRIEF HOSPITAL COURSE:   Principal Problem:  *PNA (pneumonia) -patient was admitted, she did have some cough on presentation, however she was afebrile and did not have leukocytosis as well. She was empirically placed on Rocephin and Zithromax. Her diarrhea is much better, she will be discharged on Levaquin orally. She will require a follow up CXR in 6-8 weeks to document resolution of the infiltrate.  Active Problems: Diarrhea -likely 2/2 to enteritis -Stool C Diff PCR was negative, stool cultures pending. -However she no longer has had any diarrhea for almost 24 hours, and she is anxious to be discharged  later today -Her diet is also slowly being advanced, once she has tolerated a regular diet later today-she will be discharged home at her own request   DM -resume lantus at prior dosing on discharge   CAD -stable, follow up with Primary Cardiologist as outpatient  TODAY-DAY OF DISCHARGE:  Subjective:   [name] today has no headache,no chest abdominal pain,no new weakness tingling or numbness, feels much better wants to go home today.   Objective:   Blood pressure 121/70, pulse 78, temperature 99.1 F (37.3 C), temperature source Oral, resp. rate 17, height 5\' 6"  (1.676 m), weight 87.4 kg (192 lb 10.9 oz), SpO2 94.00%.  Intake/Output  Summary (Last 24 hours) at 10/04/11 1156 Last data filed at 10/04/11 0600  Gross per 24 hour  Intake   3000 ml  Output      0 ml  Net   3000 ml    Exam Awake Alert, Oriented *3, No new F.N deficits, Normal affect [place].AT,PERRAL Supple Neck,No JVD, No cervical lymphadenopathy appriciated.  Symmetrical Chest wall movement, Good air movement bilaterally, CTAB RRR,No Gallops,Rubs or new Murmurs, No Parasternal Heave +ve B.Sounds, Abd Soft, Non tender, No organomegaly appriciated, No rebound -guarding or rigidity. No Cyanosis, Clubbing or edema, No new Rash or bruise  DISCHARGE CONDITION: Stable  DISPOSITION: HOME  DISCHARGE INSTRUCTIONS:    Activity:  As tolerated with Full fall precautions use walker/cane & assistance as needed  Diet recommendation: Diabetic Diet Heart Healthy diet   Follow-up Information    Follow up with [name],[name]. Schedule an appointment as soon as possible for a visit in 1 week.   Contact information:   [address] [place] [address]         Total Time spent on discharge equals 45 minutes.  SignedJeoffrey Massed 10/04/2011 11:56 AM

## 2011-10-04 NOTE — Care Management Note (Signed)
    Page 1 of 1   10/04/2011     11:24:57 AM   CARE MANAGEMENT NOTE 10/04/2011  Patient:  [name], [name]   Account Number:  [id number]  Date Initiated:  10/04/2011  Documentation initiated by:  Letha Cape  Subjective/Objective Assessment:   dx pna  admit- lives alone. pta independent.     Action/Plan:   Anticipated DC Date:  10/04/2011   Anticipated DC Plan:  HOME/SELF CARE      DC Planning Services  CM consult      Choice offered to / List presented to:             Status of service:  Completed, signed off Medicare Important Message given?   (If response is "NO", the following Medicare IM given date fields will be blank) Date Medicare IM given:   Date Additional Medicare IM given:    Discharge Disposition:  HOME/SELF CARE  Per UR Regulation:  Reviewed for med. necessity/level of care/duration of stay  If discussed at Long Length of Stay Meetings, dates discussed:    Comments:  10/04/11 11:17 Letha Cape RN, BSN [phone] patient lives alone, pta independent, patient works as a Firefighter.  Patient has medication coverage and transportation.  Patient is for dc today. No needs anticiapted.

## 2011-10-06 LAB — STOOL CULTURE

## 2011-11-07 ENCOUNTER — Telehealth: Payer: Self-pay | Admitting: Cardiology

## 2011-11-07 NOTE — Telephone Encounter (Signed)
Pt having chest pressure and SOB, wants appt next week with hochrein, he's booked ,[pls call [phone]

## 2011-11-07 NOTE — Telephone Encounter (Signed)
Pt experienced shortness of breath this past weekend while walking through the airport coming home from [place]. She was hospitalized a month ago ([facility]) with pneumonia.  She thought this might be residual from the pneumonia. But, yesterday evening (9:30pm) she experienced chest tightness while lying on the couch. She said this was similar to what she experienced prior to her bypass surgery in January. She would like to see Dr. Antoine Poche next week. She agrees to see Lawson Fiscal next week same day as Dr. Antoine Poche is in the office. Mylo Red RN

## 2011-11-15 ENCOUNTER — Other Ambulatory Visit: Payer: Self-pay | Admitting: Nurse Practitioner

## 2011-11-15 ENCOUNTER — Ambulatory Visit (INDEPENDENT_AMBULATORY_CARE_PROVIDER_SITE_OTHER): Payer: Medicare HMO | Admitting: Nurse Practitioner

## 2011-11-15 ENCOUNTER — Encounter: Payer: Self-pay | Admitting: Nurse Practitioner

## 2011-11-15 VITALS — BP 150/80 | HR 82 | Ht 66.0 in | Wt 197.0 lb

## 2011-11-15 DIAGNOSIS — R0609 Other forms of dyspnea: Secondary | ICD-10-CM

## 2011-11-15 DIAGNOSIS — R0600 Dyspnea, unspecified: Secondary | ICD-10-CM

## 2011-11-15 DIAGNOSIS — R079 Chest pain, unspecified: Secondary | ICD-10-CM

## 2011-11-15 MED ORDER — ISOSORBIDE MONONITRATE ER 30 MG PO TB24: 30.0000 mg | ORAL_TABLET | Freq: Every day | ORAL | Status: DC

## 2011-11-15 NOTE — Progress Notes (Signed)
[name] Date of Birth: Jan 18, 1946 Medical Record #[id number]  History of Present Illness: [name] is seen today for a work in visit. She is seen for Dr. Antoine Poche. She has known CAD with prior CABG in January of 2012 per Dr. Cornelius Moras. She had CABG x 3 and had poor target vessels for grafting due to distal disease. Her other problems include IDDM, HLD with statin intolerance and aspirin allergy.   She comes in today. She is here alone. She was last seen here back in December. She was doing fairly well. She has done well up until about 1 to 2 months ago. She went to the ER at [facility] in late July with significant diarrhea and fatigue. Had an abdominal CT and was told that she had pneumonia. She did not have a CXR or chest CT. Since that time, she has had progression of her fatigue. She is "just exhausted" with minimal activity. She has had recurrent chest pressure. This happened about 2 weeks ago while in the airport in [place]. Weight is up. She is short of breath. Belly feels bloated at times. Not really nauseated and no vomiting. She says she feels like she did when she had her CABG. She says her blood pressure is better at home. She is on basically no medicines and says she does not tolerate medicines. Last A1C is reported to be at 7. She is not exercising. EF was 50% at the time of her cath in 2012. 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   Current Outpatient Prescriptions on File Prior to Visit  Medication Sig Dispense Refill  . acetaminophen (TYLENOL) 325 MG tablet Take 650 mg by mouth as needed. For pain      . insulin glargine (LANTUS) 100 UNIT/ML injection Inject 12-20 Units into the skin at bedtime. Based on sugar levels      . Omega-3 Fatty Acids (OMEGA 3 PO) Take 1,200 Units by mouth daily.        Bertram Gala Glycol-Propyl Glycol (SYSTANE OP) Place 1 drop into both eyes as needed. For dry eyes      . isosorbide mononitrate (IMDUR) 30 MG 24 hr tablet Take 1 tablet (30 mg total) by  mouth daily.  30 tablet  3    Allergies  Allergen Reactions  . Bee Venom Anaphylaxis  . Aspirin Hives  . Codeine Hives  . Demerol Hives  . Meperidine Hcl Hives  . Prednisone Hives    Past Medical History  Diagnosis Date  . CAD (coronary artery disease)     S/P CABG January 2012; had poor target vessels for grafting  . S/P cardiac catheterization 03/2010    EF 50%   . Orthostatic hypotension     previous Midodrine therapy  . DM (diabetes mellitus)   . Obesity   . HLD (hyperlipidemia)     statin intolerant  . Aspirin allergy   . PVD (peripheral vascular disease)     prior balloon PCI to left leg in [facility]    Past Surgical History  Procedure Date  . Coronary artery bypass graft 03/2010  . Cardiac catheterization 03/2010  . Carotid dopplers 03/2010    40-59% bilateral ICA stenosis  . Appendectomy   . Tonsillectomy   . Cholecystectomy 2012  . Other surgical history     cataract; bilateral  . Other surgical history     angioplasty left leg    History  Smoking status  . Never Smoker   Smokeless tobacco  .  Never Used    History  Alcohol Use No    Family History  Problem Relation Age of Onset  . Esophageal cancer Mother   . Heart attack Father     MI    Review of Systems: The review of systems is per the HPI.  All other systems were reviewed and are negative.  Physical Exam: BP 150/80  Pulse 82  Ht 5\' 6"  (1.676 m)  Wt 197 lb (89.359 kg)  BMI 31.80 kg/m2 Patient is very pleasant and in no acute distress. She is obese. Her weight is up over 20 pounds since her last visit here. Skin is warm and dry. Color is normal.  HEENT is unremarkable. Normocephalic/atraumatic. PERRL. Sclera are nonicteric. Neck is supple. No masses. No JVD. Lungs are clear. Cardiac exam shows a regular rate and rhythm. She is tachycardic. She has an S4 noted. Abdomen is soft. Extremities are with trace edema. Gait and ROM are intact. No gross neurologic deficits noted.   LABORATORY  DATA: EKG today shows sinus with left axis. Lateral T wave changes. No acute changes.  CXR and labs are pending.    Assessment / Plan:  Chest pain and dyspnea - She has known CAD with fairly recent CABG. EF was 50%. No prior echo noted. Targets were noted to be poor per the operative note. I suspect she is having angina. Her weight is also up. She has a gallop on exam. Will check an echo. Check labs today and also send her for a CXR today. I have started her on Imdur 30 mg daily. Hopefully she will be able to tolerate. She is not wanting lots of medicines added but we did discuss Plavix in light of her aspirin allergy. I will see her back next week when Dr. Antoine Poche is here. She may need repeat cardiac catheterization set up at that time. Further disposition to follow once her studies are complete.   Elevated BP - she tells me that her BP is good at home. I have asked her to continue to monitor.  HLD - not able to take statin therapy.  We will see her back next week.   Patient is agreeable to this plan and will call if any problems develop in the interim.

## 2011-11-15 NOTE — Patient Instructions (Addendum)
We need to check some labs today  We need to get a CXR today. Go to [facility] [employer] may walk in  I am going to start you on Imdur 30 mg a daily  Dr. Antoine Poche and I will see you next week. Will probably refer you for cardiac catheterization  Call the [facility] office at [phone] if you have any questions, problems or concerns.

## 2011-11-16 ENCOUNTER — Ambulatory Visit
Admission: RE | Admit: 2011-11-16 | Discharge: 2011-11-16 | Disposition: A | Discharge status: Home / Self Care | Payer: Medicare HMO | Source: Ambulatory Visit | Attending: Nurse Practitioner | Admitting: Nurse Practitioner

## 2011-11-16 ENCOUNTER — Telehealth: Payer: Self-pay | Admitting: *Deleted

## 2011-11-16 DIAGNOSIS — R079 Chest pain, unspecified: Secondary | ICD-10-CM

## 2011-11-16 LAB — CBC WITH DIFFERENTIAL/PLATELET
Basophils Absolute: 0 10*3/uL (ref 0.0–0.1)
Basophils Relative: 1 % (ref 0–1)
Eosinophils Absolute: 0.2 10*3/uL (ref 0.0–0.7)
Eosinophils Relative: 4 % (ref 0–5)
HCT: 40.8 % (ref 36.0–46.0)
Hemoglobin: 13.5 g/dL (ref 12.0–15.0)
Lymphocytes Relative: 37 % (ref 12–46)
Lymphs Abs: 2.3 10*3/uL (ref 0.7–4.0)
MCH: 28.8 pg (ref 26.0–34.0)
MCHC: 33.1 g/dL (ref 30.0–36.0)
MCV: 87 fL (ref 78.0–100.0)
Monocytes Absolute: 0.4 10*3/uL (ref 0.1–1.0)
Monocytes Relative: 6 % (ref 3–12)
Neutro Abs: 3.4 10*3/uL (ref 1.7–7.7)
Neutrophils Relative %: 52 % (ref 43–77)
Platelets: 223 10*3/uL (ref 150–400)
RBC: 4.69 MIL/uL (ref 3.87–5.11)
RDW: 14.6 % (ref 11.5–15.5)
WBC: 6.3 10*3/uL (ref 4.0–10.5)

## 2011-11-16 LAB — BASIC METABOLIC PANEL
BUN: 24 mg/dL — ABNORMAL HIGH (ref 6–23)
CO2: 23 mEq/L (ref 19–32)
Calcium: 9.8 mg/dL (ref 8.4–10.5)
Chloride: 103 mEq/L (ref 96–112)
Creat: 0.59 mg/dL (ref 0.50–1.10)
Glucose, Bld: 87 mg/dL (ref 70–99)
Potassium: 3.9 mEq/L (ref 3.5–5.3)
Sodium: 140 mEq/L (ref 135–145)

## 2011-11-16 LAB — BRAIN NATRIURETIC PEPTIDE: Brain Natriuretic Peptide: 29.9 pg/mL (ref 0.0–100.0)

## 2011-11-16 LAB — TSH: TSH: 1.37 u[IU]/mL (ref 0.350–4.500)

## 2011-11-16 NOTE — Telephone Encounter (Signed)
Patient aware of cxr results. Vista Mink, CMA

## 2011-11-16 NOTE — Telephone Encounter (Signed)
Message copied by Awilda Bill on Wed Nov 16, 2011  4:32 PM ------      Message from: Rosalio Macadamia      Created: Wed Nov 16, 2011 12:30 PM       Ok to report. CXR is satisfactory.

## 2011-11-17 ENCOUNTER — Telehealth: Payer: Self-pay | Admitting: Cardiology

## 2011-11-17 ENCOUNTER — Telehealth: Payer: Self-pay | Admitting: *Deleted

## 2011-11-17 NOTE — Telephone Encounter (Signed)
Discussed lab results with patient.  She informed me that she is still having "episodes"  She wants to know if Lawson Fiscal feels her appt should be moved up.  Pt aware of lab results.  Will forward to [name], NP for review and advice.  Vista Mink, CMA

## 2011-11-17 NOTE — Telephone Encounter (Signed)
Per daughter pt very SOB even at rest.  "she is really struggling to breath".  She reports edema bilaterally and difficulty getting up to go to the bathroom.  Advised daughter that if pt is having that much difficulty breathing she should report to the closest ED for evaluation.  Daughter states she will attempt to have pt go but that pt generally refuses to go to the ED.  Daughter wants an appointment for pt to be seen tomorrow.  Instructed her that we will call back in the AM with appointment availability but that if pt gets worse tonight she will have to go to the ED.

## 2011-11-17 NOTE — Telephone Encounter (Signed)
Please return call to patient daughter [name] [phone] regarding labs, pt also doesn't feel well c/o of swelling, labored breathing. Please return call to advise

## 2011-11-17 NOTE — Telephone Encounter (Signed)
Message copied by Awilda Bill on Thu Nov 17, 2011  3:11 PM ------      Message from: Rosalio Macadamia      Created: Wed Nov 16, 2011  4:58 PM       Ok to report. Labs are satisfactory. Fluid level test is normal. Kidney function just slightly off. Will need to recheck if we decide to pursue cardiac cath. Will discuss further with her at her return visit.

## 2011-11-18 ENCOUNTER — Telehealth: Payer: Self-pay | Admitting: Nurse Practitioner

## 2011-11-18 ENCOUNTER — Encounter (HOSPITAL_COMMUNITY): Payer: Self-pay | Admitting: Family Medicine

## 2011-11-18 ENCOUNTER — Emergency Department (HOSPITAL_COMMUNITY): Payer: Medicare HMO

## 2011-11-18 ENCOUNTER — Observation Stay (HOSPITAL_COMMUNITY)
Admission: EM | Admit: 2011-11-18 | Discharge: 2011-11-22 | Disposition: A | Discharge status: Home / Self Care | Payer: Medicare HMO | Attending: Internal Medicine | Admitting: Internal Medicine

## 2011-11-18 DIAGNOSIS — R079 Chest pain, unspecified: Principal | ICD-10-CM | POA: Insufficient documentation

## 2011-11-18 DIAGNOSIS — R0602 Shortness of breath: Secondary | ICD-10-CM | POA: Insufficient documentation

## 2011-11-18 DIAGNOSIS — I2089 Other forms of angina pectoris: Secondary | ICD-10-CM | POA: Diagnosis present

## 2011-11-18 DIAGNOSIS — I208 Other forms of angina pectoris: Secondary | ICD-10-CM

## 2011-11-18 DIAGNOSIS — I6529 Occlusion and stenosis of unspecified carotid artery: Secondary | ICD-10-CM | POA: Insufficient documentation

## 2011-11-18 DIAGNOSIS — I200 Unstable angina: Secondary | ICD-10-CM

## 2011-11-18 DIAGNOSIS — E785 Hyperlipidemia, unspecified: Secondary | ICD-10-CM | POA: Insufficient documentation

## 2011-11-18 DIAGNOSIS — E669 Obesity, unspecified: Secondary | ICD-10-CM | POA: Insufficient documentation

## 2011-11-18 DIAGNOSIS — E119 Type 2 diabetes mellitus without complications: Secondary | ICD-10-CM | POA: Diagnosis present

## 2011-11-18 DIAGNOSIS — I959 Hypotension, unspecified: Secondary | ICD-10-CM | POA: Insufficient documentation

## 2011-11-18 DIAGNOSIS — I2510 Atherosclerotic heart disease of native coronary artery without angina pectoris: Secondary | ICD-10-CM | POA: Diagnosis present

## 2011-11-18 DIAGNOSIS — I25810 Atherosclerosis of coronary artery bypass graft(s) without angina pectoris: Secondary | ICD-10-CM | POA: Insufficient documentation

## 2011-11-18 LAB — TROPONIN I
Troponin I: <0.3 ng/mL (ref <0.30)
Troponin I: <0.3 ng/mL (ref <0.30)

## 2011-11-18 LAB — PRO B NATRIURETIC PEPTIDE: Pro B Natriuretic peptide (BNP): 236.6 pg/mL — ABNORMAL HIGH (ref 0–125)

## 2011-11-18 LAB — CBC WITH DIFFERENTIAL/PLATELET
Basophils Absolute: 0 10*3/uL (ref 0.0–0.1)
Basophils Relative: 0 % (ref 0–1)
Eosinophils Absolute: 0.2 10*3/uL (ref 0.0–0.7)
Eosinophils Relative: 3 % (ref 0–5)
HCT: 36.5 % (ref 36.0–46.0)
Hemoglobin: 12.6 g/dL (ref 12.0–15.0)
Lymphocytes Relative: 29 % (ref 12–46)
Lymphs Abs: 2 10*3/uL (ref 0.7–4.0)
MCH: 29.4 pg (ref 26.0–34.0)
MCHC: 34.5 g/dL (ref 30.0–36.0)
MCV: 85.3 fL (ref 78.0–100.0)
Monocytes Absolute: 0.5 10*3/uL (ref 0.1–1.0)
Monocytes Relative: 7 % (ref 3–12)
Neutro Abs: 4.2 10*3/uL (ref 1.7–7.7)
Neutrophils Relative %: 62 % (ref 43–77)
Platelets: 169 10*3/uL (ref 150–400)
RBC: 4.28 MIL/uL (ref 3.87–5.11)
RDW: 13.2 % (ref 11.5–15.5)
WBC: 6.9 10*3/uL (ref 4.0–10.5)

## 2011-11-18 LAB — CBC
HCT: 34.8 % — ABNORMAL LOW (ref 36.0–46.0)
Hemoglobin: 12 g/dL (ref 12.0–15.0)
MCH: 29.6 pg (ref 26.0–34.0)
MCHC: 34.5 g/dL (ref 30.0–36.0)
MCV: 85.9 fL (ref 78.0–100.0)
Platelets: 161 10*3/uL (ref 150–400)
RBC: 4.05 MIL/uL (ref 3.87–5.11)
RDW: 13.1 % (ref 11.5–15.5)
WBC: 6.2 10*3/uL (ref 4.0–10.5)

## 2011-11-18 LAB — BASIC METABOLIC PANEL
BUN: 15 mg/dL (ref 6–23)
CO2: 22 mEq/L (ref 19–32)
Calcium: 9.3 mg/dL (ref 8.4–10.5)
Chloride: 104 mEq/L (ref 96–112)
Creatinine, Ser: 0.49 mg/dL — ABNORMAL LOW (ref 0.50–1.10)
GFR calc Af Amer: >90 mL/min (ref >90)
GFR calc non Af Amer: >90 mL/min (ref >90)
Glucose, Bld: 113 mg/dL — ABNORMAL HIGH (ref 70–99)
Potassium: 3.9 mEq/L (ref 3.5–5.1)
Sodium: 138 mEq/L (ref 135–145)

## 2011-11-18 LAB — GLUCOSE, CAPILLARY
Glucose-Capillary: 162 mg/dL — ABNORMAL HIGH (ref 70–99)
Glucose-Capillary: 180 mg/dL — ABNORMAL HIGH (ref 70–99)

## 2011-11-18 LAB — CREATININE, SERUM
Creatinine, Ser: 0.49 mg/dL — ABNORMAL LOW (ref 0.50–1.10)
GFR calc Af Amer: >90 mL/min (ref >90)
GFR calc non Af Amer: >90 mL/min (ref >90)

## 2011-11-18 LAB — CK TOTAL AND CKMB (NOT AT ARMC)
CK, MB: 2.2 ng/mL (ref 0.3–4.0)
Relative Index: INVALID (ref 0.0–2.5)
Total CK: 54 U/L (ref 7–177)

## 2011-11-18 LAB — D-DIMER, QUANTITATIVE (NOT AT ARMC): D-Dimer, Quant: 0.31 ug/mL-FEU (ref 0.00–0.48)

## 2011-11-18 MED ORDER — HEPARIN SODIUM (PORCINE) 5000 UNIT/ML IJ SOLN
5000.0000 [IU] | Freq: Three times a day (TID) | INTRAMUSCULAR | Status: DC
  Filled 2011-11-18 (×11): qty 1

## 2011-11-18 MED ORDER — OMEGA 3 1200 MG PO CAPS: 1200.0000 mg | ORAL_CAPSULE | Freq: Every day | ORAL | Status: DC

## 2011-11-18 MED ORDER — ISOSORBIDE MONONITRATE ER 30 MG PO TB24
30.0000 mg | ORAL_TABLET | Freq: Every day | ORAL | Status: DC
  Administered 2011-11-18 – 2011-11-19 (×2): 30 mg via ORAL
  Filled 2011-11-18 (×4): qty 1

## 2011-11-18 MED ORDER — ZOLPIDEM TARTRATE 5 MG PO TABS: 5.0000 mg | ORAL_TABLET | Freq: Every evening | ORAL | Status: DC | PRN

## 2011-11-18 MED ORDER — SODIUM CHLORIDE 0.9 % IJ SOLN
3.0000 mL | Freq: Two times a day (BID) | INTRAMUSCULAR | Status: DC
  Administered 2011-11-18 – 2011-11-20 (×5): 3 mL via INTRAVENOUS

## 2011-11-18 MED ORDER — INSULIN ASPART 100 UNIT/ML ~~LOC~~ SOLN
0.0000 [IU] | Freq: Three times a day (TID) | SUBCUTANEOUS | Status: DC
  Administered 2011-11-19 – 2011-11-20 (×3): 2 [IU] via SUBCUTANEOUS

## 2011-11-18 MED ORDER — ONDANSETRON HCL 4 MG/2ML IJ SOLN: 4.0000 mg | Freq: Four times a day (QID) | INTRAMUSCULAR | Status: DC | PRN

## 2011-11-18 MED ORDER — SODIUM CHLORIDE 0.9 % IV SOLN: 250.0000 mL | INTRAVENOUS | Status: DC | PRN

## 2011-11-18 MED ORDER — NITROGLYCERIN 0.4 MG SL SUBL: 0.4000 mg | SUBLINGUAL_TABLET | SUBLINGUAL | Status: DC | PRN

## 2011-11-18 MED ORDER — ACETAMINOPHEN 325 MG PO TABS
650.0000 mg | ORAL_TABLET | ORAL | Status: DC | PRN
  Administered 2011-11-18 – 2011-11-21 (×4): 650 mg via ORAL
  Filled 2011-11-18 (×4): qty 2

## 2011-11-18 MED ORDER — INSULIN GLARGINE 100 UNIT/ML ~~LOC~~ SOLN
10.0000 [IU] | Freq: Every day | SUBCUTANEOUS | Status: DC
  Administered 2011-11-18 – 2011-11-21 (×4): 10 [IU] via SUBCUTANEOUS

## 2011-11-18 MED ORDER — SODIUM CHLORIDE 0.9 % IJ SOLN: 3.0000 mL | INTRAMUSCULAR | Status: DC | PRN

## 2011-11-18 MED ORDER — OMEGA-3-ACID ETHYL ESTERS 1 G PO CAPS
1.0000 g | ORAL_CAPSULE | Freq: Two times a day (BID) | ORAL | Status: DC
  Administered 2011-11-19 – 2011-11-22 (×5): 1 g via ORAL
  Filled 2011-11-18 (×9): qty 1

## 2011-11-18 NOTE — Telephone Encounter (Signed)
[name]'s daughter was notified.

## 2011-11-18 NOTE — H&P (Signed)
History and Physical  Patient ID: Ananya Mccleese MRN: [id number], SOB: 01-03-1946 66 y.o. Date of Encounter: 11/18/2011, 11:56 AM  Primary Physician: [name] Primary Cardiologist: Dr. Antoine Poche  Chief Complaint: chest pain and sob  HPI: 66 y.o. female w/ PMHx significant for CAD (s/p 3v CABG 03/2010), orthostatic hypotension, DM, HLD (statin intolerance), obesity, Carotid artery stenosis (bilat 40-59%), and PVD (s/p balloon PCI to left leg) who presented to [facility] on 11/18/2011 with complaints of shortness of breath and chest pain.  She had 3v CABG in 2012 (with poor target vessels for grafting due to distal disease). She was seen in clinic by [name], NP on 11/15/11 with complaints of progressive fatigue, sob, weight gain, and recurrent chest pain. She was volume overloaded on exam. EKG was nonischemic. She was started on Imdur with plans for follow up labs, CXR, echo, and clinic follow up in one week. BNP was 29.9, TSH normal, BMET/CBC unremarkable and CXR without acute cardiopulmonary findings. Echo not done yet. Of note she has intolerance to statins and an allergy to ASA.  She reports having progressive sob over the last 2mos. She was evaluated in the hospital in late July for GI complaints and told she had enteritis and pneumonia for which she was treated with abx. She has continued to have progressive sob that has worsened over the last two weeks to the point that she can't do ADLs without significant sob and chest tightness. These are the same symptoms she was having prior to CABG. Denies chest pain, but reports chest tightness associated with her sob. Flew to [place] (6hr plane ride) two weeks ago. Denies calf pain/swelling, palpitations, or syncope. Has been woken up with sob and chest tightness this week. Today while showering she was extremely sob prompting her to present to the ED.  In the ED, EKG shows NSR with no acute ST/Tchanges. Labs are pending. She is stable  and resting comfortably.   Past Medical History  Diagnosis Date  . CAD (coronary artery disease)     S/P CABG January 2012; had poor target vessels for grafting  . S/P cardiac catheterization 03/2010    EF 50%   . Orthostatic hypotension     previous Midodrine therapy  . DM (diabetes mellitus)   . Obesity   . HLD (hyperlipidemia)     statin intolerant  . Aspirin allergy   . PVD (peripheral vascular disease)     prior balloon PCI to left leg in [facility]    03/26/10 - Cardiac Cath FINDINGS: Aortic pressure is 138/60 with a mean of 93, left ventricular pressure is 137/20.  Left ventriculography: There is mild hypokinesis of the left ventricular apex. The remaining LV wall segments contract normally.  The estimated ejection fraction is 50%.  Coronary angiography:  Left mainstem has distal 30% stenosis as it divides into the LAD and left circumflex.  LAD: The LAD has severe diffuse disease. The proximal LAD has a 90% stenosis involving the origin of both the first and second diagonal branches. The mid-LAD after a large septal perforator has a long segment of severe 90-95% stenoses. The two major diagonal branches have severe 90% ostial stenoses and they are both moderate in caliber. The distal and apical portions of the LAD have diffuse disease, but there are no critical lesions present.  Left circumflex: The left circumflex is dominant. There is 50% ostial stenosis present. The mid vessel has an 80% stenosis. There are four obtuse marginal branches, all of which  are subtotally occluded. The higher OM branches appear extremely small in the third and fourth Oms, both fill late, but appear to supply a larger myocardial territory. The left PDA arises from the circumflex and is also diffusely diseased with 70-80% stenoses.  ASSESSMENT:  1. Severe three-vessel coronary artery disease in a diffuse pattern consistent with advanced diabetic coronary disease.  2. Mild segmental left ventricular  systolic dysfunction.  RECOMMENDATIONS: The patient will be continued on intravenous heparin. We will obtain a cardiac surgery consult. I discussed the patient's case with Dr. Cornelius Moras who will see her in consultation. Unfortunately, her distal targets are poor, but I suspect coronary bypass will be the only  viable option for treatment.   Surgical History:  Past Surgical History  Procedure Date  . Coronary artery bypass graft 03/2010  . Cardiac catheterization 03/2010  . Carotid dopplers 03/2010    40-59% bilateral ICA stenosis  . Appendectomy   . Tonsillectomy   . Cholecystectomy 2012  . Other surgical history     cataract; bilateral  . Other surgical history     angioplasty left leg     Home Meds: Medication Sig  acetaminophen (TYLENOL) 325 MG tablet Take 650 mg by mouth as needed. For pain  insulin glargine (LANTUS) 100 UNIT/ML injection Inject 12-20 Units into the skin at bedtime. Based on sugar levels  isosorbide mononitrate (IMDUR) 30 MG 24 hr tablet Take 30 mg by mouth daily.  Omega-3 Fatty Acids (OMEGA 3 PO) Take 1,200 Units by mouth daily.    Polyethyl Glycol-Propyl Glycol (SYSTANE OP) Place 1 drop into both eyes as needed. For dry eyes    Allergies:  Allergies  Allergen Reactions  . Bee Venom Anaphylaxis  . Aspirin Hives  . Codeine Hives  . Cortisone Hives  . Demerol Hives  . Meperidine Hcl Hives  . Prednisone Hives    History   Social History  . Marital Status: Legally Separated    Spouse Name: N/A    Number of Children: N/A  . Years of Education: N/A   Occupational History  . Self-employed    Social History Main Topics  . Smoking status: Never Smoker   . Smokeless tobacco: Never Used  . Alcohol Use: No  . Drug Use: No  . Sexually Active: Not Currently   Other Topics Concern  . Not on file   Social History Narrative   MarriedThree children     Family History  Problem Relation Age of Onset  . Esophageal cancer Mother   . Heart attack Father      MI    Review of Systems: General: negative for chills, fever, night sweats or weight changes.  Cardiovascular: (+) chest tightness, shortness of breath, dyspnea on exertion, orthopnea, paroxysmal nocturnal dyspnea ; negative for edema, palpitations Dermatological: negative for rash Respiratory: negative for cough or wheezing Urologic: negative for hematuria Abdominal: negative for nausea, vomiting, diarrhea, bright red blood per rectum, melena, or hematemesis Neurologic: negative for visual changes, syncope, or dizziness All other systems reviewed and are otherwise negative except as noted above.  Labs:  Pending  Radiology/Studies:  None   EKG: 11/18/11 @ 10:16 - NSR 89bpm, with no acute ST/Tchanges  Physical Exam: Blood pressure 152/79, pulse 79, temperature 98.6 F (37 C), temperature source Oral, resp. rate 15, SpO2 98.00%. General: Overweight white female in no acute distress. Head: Normocephalic, atraumatic, sclera non-icteric, nares are without discharge Neck: Supple. Negative for carotid bruits. JVD not elevated. Lungs: Clear bilaterally to auscultation  without wheezes, rales, or rhonchi. Breathing is unlabored. Heart: RRR with S1 S2. No murmurs, rubs, or gallops appreciated. Abdomen: Soft, non-tender, non-distended with normoactive bowel sounds. No rebound/guarding. No obvious abdominal masses. Msk:  Strength and tone appear normal for age. Extremities: Decreased sensation in BLE. No edema. No clubbing or cyanosis. Distal pedal pulses are 2+ and equal bilaterally. Neuro: Alert and oriented X 3. Moves all extremities spontaneously. Psych:  Responds to questions appropriately with a normal affect.    ASSESSMENT AND PLAN:  66 y.o. female w/ PMHx significant for CAD (s/p 3v CABG 03/2010), orthostatic hypotension, DM, HLD (statin intolerance), obesity, Carotid artery stenosis (bilat 40-59%), and PVD (s/p balloon PCI to left leg) who presented to [facility] on 11/18/2011  with complaints of shortness of breath and chest pain.  1. Dyspnea 2. Chest tightness 3. Coronary Artery Disease s/p 3v CABG 03/2010 4. Diabetes Mellitus 5. Hyperlipidemia w/ statin intolerance 6. ASA allergy  Patient presents with progressive sob and chest tightness which are similar to the symptoms she had prior to her CABG. EKG is nonischemic. BNP was normal on 9/3. CXR was without acute findings on 9/4. She does not have volume excess on exam. She is afebrile, without leukocytosis or infiltrates on CXR, so doubt PNA. She did travel on a plane for ~6hrs two weeks ago so will check a DDimer. If (+) get CTA to rule out PE. If negative she will need a right and left heart catheterization (on Monday, earlier if rules in/decompensates). Monitor on telemetry and cycle enzymes. Hold off on IV heparin unless she rules in. No echo as she will have right/left heart cath. No ASA/statin due to allergy/intolerance. Will not start plavix in case she needs redo CABG. Cont Imdur. Monitor CBGs. Cont Lantus and place on SSI   Signed, HOPE, JESSICA PA-C 11/18/2011, 11:56 AM  Patient seen and examined  Agree with findings of J Hope above. patietn a 66 year old with a history of significant CAD.  Cath with poor targets.  Underwent CABG in 2011. Has over the past few months noted progressive SOB with activity as well as chest tightness.  Similar to what she had before surgery.  Recent labs BNP wa normal  Exam:  Neck:  JVP normal.  Lungs CTA  Cardiac:  RRR.  No S3  No murmurs Extrmities No edema.  EKG without acout changes   Rec:  Admit  WOuld recomm R and L heart cath to define anatomy and pressures.  Check D Dimer given recent travel to [place] though symptoms predated that.   2  HL  Statin intolearane per patient report.  NOt sure what she has tried in past. 3.  DM  Follow.  4.  ASA  Allergic  Leads to hives.  Dietrich Pates 3:20 PM

## 2011-11-18 NOTE — ED Notes (Signed)
Per pt she has been having intermittent chest pain for a few months but worsening over the last week. sts in the middle of her chest and associated with SOB and weakness more upon exertion.

## 2011-11-18 NOTE — Telephone Encounter (Signed)
Patient daughter Bjorn Loser returning nurse call, she can be reached at [phone]

## 2011-11-18 NOTE — Telephone Encounter (Signed)
I think she should go on to the ER for further evaluation.

## 2011-11-18 NOTE — ED Provider Notes (Signed)
History     CSN: [id number]  Arrival date & time 11/18/11  1015   First MD Initiated Contact with Patient 11/18/11 1027      Chief Complaint  Patient presents with  . Chest Pain    (Consider location/radiation/quality/duration/timing/severity/associated sxs/prior treatment) Patient is a 66 y.o. female presenting with chest pain. The history is provided by the patient.  Chest Pain Primary symptoms include shortness of breath. Pertinent negatives for primary symptoms include no abdominal pain, no nausea and no vomiting.  Pertinent negatives for associated symptoms include no numbness and no weakness.    patient's had chest pain the last few months, but worse the last week. She has known coronary disease and had a poor CABG. She states that she saw her cardiologist and they're planning an echo and a possibly heart cath. She states she's gotten worse since seen her earlier this week. No fevers. No cough. Shortness of breath is worse with exertion. She states she only do a couple steps now. She states that the pain is gone now, would come back with walking.  Past Medical History  Diagnosis Date  . CAD (coronary artery disease)     S/P CABG January 2012; had poor target vessels for grafting  . S/P cardiac catheterization 03/2010    EF 50%   . Orthostatic hypotension     previous Midodrine therapy  . DM (diabetes mellitus)   . Obesity   . HLD (hyperlipidemia)     statin intolerant  . Aspirin allergy   . PVD (peripheral vascular disease)     prior balloon PCI to left leg in [facility]    Past Surgical History  Procedure Date  . Coronary artery bypass graft 03/2010  . Cardiac catheterization 03/2010  . Carotid dopplers 03/2010    40-59% bilateral ICA stenosis  . Appendectomy   . Tonsillectomy   . Cholecystectomy 2012  . Other surgical history     cataract; bilateral  . Other surgical history     angioplasty left leg    Family History  Problem Relation Age of Onset  .  Esophageal cancer Mother   . Heart attack Father     MI    History  Substance Use Topics  . Smoking status: Never Smoker   . Smokeless tobacco: Never Used  . Alcohol Use: No    OB History    Grav Para Term Preterm Abortions TAB SAB Ect Mult Living                  Review of Systems  Constitutional: Negative for activity change and appetite change.  HENT: Negative for neck stiffness.   Eyes: Negative for pain.  Respiratory: Positive for shortness of breath. Negative for chest tightness.   Cardiovascular: Positive for chest pain. Negative for leg swelling.  Gastrointestinal: Negative for nausea, vomiting, abdominal pain and diarrhea.  Genitourinary: Negative for flank pain.  Musculoskeletal: Negative for back pain.  Skin: Negative for rash.  Neurological: Negative for weakness, numbness and headaches.  Psychiatric/Behavioral: Negative for behavioral problems.    Allergies  Bee venom; Aspirin; Codeine; Cortisone; Demerol; Meperidine hcl; and Prednisone  Home Medications   No current outpatient prescriptions on file.  BP 152/79  Pulse 79  Temp 98.6 F (37 C) (Oral)  Resp 15  SpO2 98%  Physical Exam  Nursing note and vitals reviewed. Constitutional: She is oriented to person, place, and time. She appears well-developed and well-nourished.  HENT:  Head: Normocephalic and atraumatic.  Eyes:  EOM are normal. Pupils are equal, round, and reactive to light.  Neck: Normal range of motion. Neck supple.  Cardiovascular: Normal rate, regular rhythm and normal heart sounds.   No murmur heard. Pulmonary/Chest: Effort normal and breath sounds normal. No respiratory distress. She has no wheezes. She has no rales.  Abdominal: Soft. Bowel sounds are normal. She exhibits no distension. There is no tenderness. There is no rebound and no guarding.  Musculoskeletal: Normal range of motion. She exhibits edema.  Neurological: She is alert and oriented to person, place, and time. No  cranial nerve deficit.  Skin: Skin is warm and dry.  Psychiatric: She has a normal mood and affect. Her speech is normal.    ED Course  Procedures (including critical care time)  Labs Reviewed  BASIC METABOLIC PANEL - Abnormal; Notable for the following:    Glucose, Bld 113 (*)     Creatinine, Ser 0.49 (*)     All other components within normal limits  PRO B NATRIURETIC PEPTIDE - Abnormal; Notable for the following:    Pro B Natriuretic peptide (BNP) 236.6 (*)     All other components within normal limits  CBC WITH DIFFERENTIAL  TROPONIN I  D-DIMER, QUANTITATIVE  TROPONIN I  TROPONIN I  CBC  CREATININE, SERUM  CK TOTAL AND CKMB  CK TOTAL AND CKMB   No results found.   1. Unstable angina   2. Chest pain, unspecified   3. Coronary atherosclerosis of unspecified type of vessel, native or graft   4. Type II or unspecified type diabetes mellitus without mention of complication, not stated as uncontrolled      Date: 11/18/2011  Rate: 89  Rhythm: normal sinus rhythm  QRS Axis: normal  Intervals: normal  ST/T Wave abnormalities: normal  Conduction Disutrbances:none  Narrative Interpretation: low QRS  Old EKG Reviewed: none available    MDM  Patient with chest pain like previous angina. EKG is stable. Patient will be admitted to cardiology for further workup.        Juliet Rude. Rubin Payor, MD 11/18/11 1610

## 2011-11-18 NOTE — Telephone Encounter (Signed)
[name] was called and told pt is on her way in to the ER.

## 2011-11-18 NOTE — ED Notes (Signed)
Attempted IV start X 2 without success. 

## 2011-11-18 NOTE — Telephone Encounter (Signed)
N/A.  LMTC. 

## 2011-11-18 NOTE — ED Notes (Signed)
Patient was brought in by ambulance with complaint of chest discomfort for a while and has been getting progressively worse. Patient also stated that she has been getting shortness of breath and exertion. Patient denies any fever, no cough, no dizziness. Pt is A/A/Ox4, skin is warm and dry, respiration is even and unlabored.

## 2011-11-18 NOTE — Telephone Encounter (Signed)
I told Mrs Sardo and her daughter again that [name] recommended that she go to the ER.

## 2011-11-19 DIAGNOSIS — I2510 Atherosclerotic heart disease of native coronary artery without angina pectoris: Secondary | ICD-10-CM

## 2011-11-19 DIAGNOSIS — I2089 Other forms of angina pectoris: Secondary | ICD-10-CM | POA: Diagnosis present

## 2011-11-19 DIAGNOSIS — I208 Other forms of angina pectoris: Secondary | ICD-10-CM | POA: Diagnosis present

## 2011-11-19 LAB — BASIC METABOLIC PANEL
BUN: 17 mg/dL (ref 6–23)
CO2: 25 mEq/L (ref 19–32)
Calcium: 9.1 mg/dL (ref 8.4–10.5)
Chloride: 105 mEq/L (ref 96–112)
Creatinine, Ser: 0.57 mg/dL (ref 0.50–1.10)
GFR calc Af Amer: >90 mL/min (ref >90)
GFR calc non Af Amer: >90 mL/min (ref >90)
Glucose, Bld: 134 mg/dL — ABNORMAL HIGH (ref 70–99)
Potassium: 3.5 mEq/L (ref 3.5–5.1)
Sodium: 140 mEq/L (ref 135–145)

## 2011-11-19 LAB — CK TOTAL AND CKMB (NOT AT ARMC)
CK, MB: 2.1 ng/mL (ref 0.3–4.0)
Relative Index: INVALID (ref 0.0–2.5)
Total CK: 47 U/L (ref 7–177)

## 2011-11-19 LAB — GLUCOSE, CAPILLARY
Glucose-Capillary: 133 mg/dL — ABNORMAL HIGH (ref 70–99)
Glucose-Capillary: 97 mg/dL (ref 70–99)
Glucose-Capillary: 145 mg/dL — ABNORMAL HIGH (ref 70–99)
Glucose-Capillary: 199 mg/dL — ABNORMAL HIGH (ref 70–99)

## 2011-11-19 LAB — BLOOD GAS, ARTERIAL
Acid-base deficit: 0.3 mmol/L (ref 0.0–2.0)
Bicarbonate: 23.7 mEq/L (ref 20.0–24.0)
Drawn by: 277331
FIO2: 0.21 %
O2 Saturation: 94.9 %
Patient temperature: 98.7
TCO2: 24.9 mmol/L (ref 0–100)
pCO2 arterial: 37.7 mmHg (ref 35.0–45.0)
pH, Arterial: 7.415 (ref 7.350–7.450)
pO2, Arterial: 68.3 mmHg — ABNORMAL LOW (ref 80.0–100.0)

## 2011-11-19 LAB — CBC
HCT: 35 % — ABNORMAL LOW (ref 36.0–46.0)
Hemoglobin: 11.8 g/dL — ABNORMAL LOW (ref 12.0–15.0)
MCH: 28.8 pg (ref 26.0–34.0)
MCHC: 33.7 g/dL (ref 30.0–36.0)
MCV: 85.4 fL (ref 78.0–100.0)
Platelets: 157 10*3/uL (ref 150–400)
RBC: 4.1 MIL/uL (ref 3.87–5.11)
RDW: 13 % (ref 11.5–15.5)
WBC: 5.8 10*3/uL (ref 4.0–10.5)

## 2011-11-19 LAB — TROPONIN I: Troponin I: <0.3 ng/mL (ref <0.30)

## 2011-11-19 NOTE — Progress Notes (Signed)
[name]  66 y.o.  female  Subjective: No symptoms at rest.  Allergy: Bee venom; Aspirin; Codeine; Cortisone; Demerol; Meperidine hcl; and Prednisone  Objective: Vital signs in last 24 hours: Temp:  [97.8 F (36.6 C)-98.2 F (36.8 C)] 97.8 F (36.6 C) (09/07 0500) Pulse Rate:  [71-79] 71  (09/07 0500) Resp:  [15-18] 18  (09/07 0500) BP: (97-152)/(55-79) 97/55 mmHg (09/07 0500) SpO2:  [95 %-98 %] 95 % (09/07 0500) Weight:  [89.585 kg (197 lb 8 oz)] 89.585 kg (197 lb 8 oz) (09/07 0500)  89.585 kg (197 lb 8 oz) There is no height on file to calculate BMI.  Weight change:  Last BM Date: 11/19/11  Intake/Output from previous day: No data available  General- Well developed; no acute distress; overweight Neck- No JVD, no carotid bruits Lungs- clear lung fields; normal I:E ratio Cardiovascular- normal PMI; normal S1 and S2 Abdomen- normal bowel sounds; soft and non-tender without masses or organomegaly Skin- Warm, no significant lesions Extremities- decreased to absent distal pulses; trace edema  Lab Results: Cardiac Markers:   Basename 11/19/11 0212 11/18/11 2023  TROPONINI <0.30 <0.30   CBC:   Basename 11/19/11 0212 11/18/11 1741  WBC 5.8 6.2  HGB 11.8* 12.0  HCT 35.0* 34.8*  PLT 157 161   BMET:  Basename 11/19/11 0212 11/18/11 1741 11/18/11 1254  NA 140 -- 138  K 3.5 -- 3.9  CL 105 -- 104  CO2 25 -- 22  GLUCOSE 134* -- 113*  BUN 17 -- 15  CREATININE 0.57 0.49* --  CALCIUM 9.1 -- 9.3   EKG:  Normal sinus rhythm; low-voltage; left atrial abnormality; otherwise normal and unchanged from 11/18/11  Imaging Studies/Results:   CXR 11/15/2011:  Minimal biapical pleural thickening, calcified breast prostheses, clear lung fields.   D-dimer: 0.31  proBNP-237  Medications:  I have reviewed the patient's current medications.  Assessment/Plan: ASCVD: Recurrent symptoms highly suggestive of myocardial ischemia. Cardiac catheterization planned for Monday. D-dimer is  negative suggesting that acute or chronic pulmonary embolism is not the cause for her symptoms. Chest x-ray is negative for congestive heart failure.  ProBNP level mildly elevated, but probably does not reflect significant congestive heart failure.  Currently modest medical therapy need not be adjusted until catheterization results are known.   LOS: 1 day   [place] Bing 11/19/2011, 10:33 AM

## 2011-11-20 DIAGNOSIS — I209 Angina pectoris, unspecified: Secondary | ICD-10-CM

## 2011-11-20 LAB — GLUCOSE, CAPILLARY
Glucose-Capillary: 125 mg/dL — ABNORMAL HIGH (ref 70–99)
Glucose-Capillary: 125 mg/dL — ABNORMAL HIGH (ref 70–99)
Glucose-Capillary: 91 mg/dL (ref 70–99)
Glucose-Capillary: 117 mg/dL — ABNORMAL HIGH (ref 70–99)
Glucose-Capillary: 187 mg/dL — ABNORMAL HIGH (ref 70–99)

## 2011-11-20 LAB — LDL CHOLESTEROL, DIRECT: Direct LDL: 182 mg/dL — ABNORMAL HIGH

## 2011-11-20 LAB — PROTIME-INR
INR: 1.02 (ref 0.00–1.49)
Prothrombin Time: 13.6 seconds (ref 11.6–15.2)

## 2011-11-20 LAB — LIPID PANEL
Cholesterol: 289 mg/dL — ABNORMAL HIGH (ref 0–200)
HDL: 51 mg/dL (ref >39)
LDL Cholesterol: 198 mg/dL — ABNORMAL HIGH (ref 0–99)
Total CHOL/HDL Ratio: 5.7 RATIO
Triglycerides: 201 mg/dL — ABNORMAL HIGH (ref <150)
VLDL: 40 mg/dL (ref 0–40)

## 2011-11-20 MED ORDER — AMLODIPINE BESYLATE 2.5 MG PO TABS
2.5000 mg | ORAL_TABLET | Freq: Every day | ORAL | Status: DC
  Administered 2011-11-20 – 2011-11-22 (×2): 2.5 mg via ORAL
  Filled 2011-11-20 (×3): qty 1

## 2011-11-20 NOTE — Progress Notes (Signed)
[name]  66 y.o.  female  Subjective: Patient notes paresthesias and generalized malaise, which she attributes to isosorbide mononitrate; however, she has experienced less dyspnea and chest pressure with minimal activity.  Allergy: Bee venom; Aspirin; Codeine; Cortisone; Demerol; Meperidine hcl; and Prednisone  Objective: Vital signs in last 24 hours: Temp:  [97.7 F (36.5 C)-98.2 F (36.8 C)] 97.7 F (36.5 C) (09/08 0600) Pulse Rate:  [70-79] 77  (09/08 0600) Resp:  [17-20] 20  (09/08 0600) BP: (125-139)/(65-82) 125/67 mmHg (09/08 0600) SpO2:  [95 %-100 %] 98 % (09/08 0600) Weight:  [90.357 kg (199 lb 3.2 oz)] 90.357 kg (199 lb 3.2 oz) (09/08 0600)  90.357 kg (199 lb 3.2 oz) There is no height on file to calculate BMI.  Weight change: 0.771 kg (1 lb 11.2 oz) Last BM Date: 11/19/11  General- Well developed; no acute distress; overweight Neck- No JVD, no carotid bruits Lungs- clear lung fields; normal I:E ratio Cardiovascular- normal PMI; normal S1 and S2; S4 present Abdomen- normal bowel sounds; soft and non-tender without masses or organomegaly Skin- Warm, no significant lesions Extremities- decreased to absent distal pulses; trace edema  Lab Results: Cardiac Markers:    Basename 11/19/11 0212 11/18/11 2023  TROPONINI <0.30 <0.30   CBC:    Basename 11/19/11 0212 11/18/11 1741  WBC 5.8 6.2  HGB 11.8* 12.0  HCT 35.0* 34.8*  PLT 157 161   BMET:   Basename 11/19/11 0212 11/18/11 1741 11/18/11 1254  NA 140 -- 138  K 3.5 -- 3.9  CL 105 -- 104  CO2 25 -- 22  GLUCOSE 134* -- 113*  BUN 17 -- 15  CREATININE 0.57 0.49* --  CALCIUM 9.1 -- 9.3   EKG:  Normal sinus rhythm; low-voltage; left atrial abnormality; otherwise normal and unchanged from 11/18/11  Imaging Studies/Results:   CXR 11/15/2011:  Minimal biapical pleural thickening, calcified breast prostheses, clear lung fields.   D-dimer: 0.31  proBNP-237  Medications:  I have reviewed the patient's current  medications.  Assessment/Plan: ASCVD: Recurrent symptoms highly suggestive of myocardial ischemia. Precatheterization orders completed.  Isosorbide mononitrate will be discontinued and amlodipine added to her medical regime. She notes aspirin allergy and discontinuation of Plavix prior to cholecystectomy.  Ultimately, she will need an antiplatelet agent resumed.  She is also on no lipid-lowering agents. Fasting profile will be obtained.  Most recent previous results are from early 2012 when she had a high total cholesterol high triglycerides, which did not permit estimation of LDL.   LOS: 2 days   [place] Bing 11/20/2011, 9:18 AM

## 2011-11-20 NOTE — Progress Notes (Signed)
Pt. Reports that she had a bad experience with an IV in the past in her Left arm that required outpt PT after discharge. She does not want her Left arm used for veinipuncture or B/P. We have honored that pt preference throughout this hospitalization, however, in order to do the right radial cath which she requested she would prefer, I explained to her that she would need two IVs in the left arm. The pt has agreed for left arm placement and has been added to the IV team list to be started in the AM.

## 2011-11-21 ENCOUNTER — Encounter (HOSPITAL_COMMUNITY)
Admission: EM | Disposition: A | Discharge status: Home / Self Care | Payer: Self-pay | Source: Home / Self Care | Attending: Emergency Medicine

## 2011-11-21 DIAGNOSIS — I2510 Atherosclerotic heart disease of native coronary artery without angina pectoris: Secondary | ICD-10-CM

## 2011-11-21 HISTORY — PX: RIGHT HEART CATHETERIZATION: SHX5447

## 2011-11-21 HISTORY — PX: LEFT HEART CATHETERIZATION WITH CORONARY/GRAFT ANGIOGRAM: SHX5450

## 2011-11-21 LAB — GLUCOSE, CAPILLARY
Glucose-Capillary: 119 mg/dL — ABNORMAL HIGH (ref 70–99)
Glucose-Capillary: 118 mg/dL — ABNORMAL HIGH (ref 70–99)
Glucose-Capillary: 109 mg/dL — ABNORMAL HIGH (ref 70–99)
Glucose-Capillary: 128 mg/dL — ABNORMAL HIGH (ref 70–99)

## 2011-11-21 LAB — BASIC METABOLIC PANEL
BUN: 19 mg/dL (ref 6–23)
CO2: 20 mEq/L (ref 19–32)
Calcium: 9.3 mg/dL (ref 8.4–10.5)
Chloride: 106 mEq/L (ref 96–112)
Creatinine, Ser: 0.76 mg/dL (ref 0.50–1.10)
GFR calc Af Amer: >90 mL/min (ref >90)
GFR calc non Af Amer: 85 mL/min — ABNORMAL LOW (ref >90)
Glucose, Bld: 131 mg/dL — ABNORMAL HIGH (ref 70–99)
Potassium: 4 mEq/L (ref 3.5–5.1)
Sodium: 139 mEq/L (ref 135–145)

## 2011-11-21 LAB — POCT I-STAT 3, ART BLOOD GAS (G3+)
Acid-base deficit: 2 mmol/L (ref 0.0–2.0)
Bicarbonate: 23.1 mEq/L (ref 20.0–24.0)
O2 Saturation: 97 %
TCO2: 24 mmol/L (ref 0–100)
pCO2 arterial: 41.2 mmHg (ref 35.0–45.0)
pH, Arterial: 7.356 (ref 7.350–7.450)
pO2, Arterial: 95 mmHg (ref 80.0–100.0)

## 2011-11-21 LAB — POCT I-STAT 3, VENOUS BLOOD GAS (G3P V)
Acid-base deficit: 1 mmol/L (ref 0.0–2.0)
Bicarbonate: 24.5 mEq/L — ABNORMAL HIGH (ref 20.0–24.0)
O2 Saturation: 72 %
TCO2: 26 mmol/L (ref 0–100)
pCO2, Ven: 42.5 mmHg — ABNORMAL LOW (ref 45.0–50.0)
pH, Ven: 7.369 — ABNORMAL HIGH (ref 7.250–7.300)
pO2, Ven: 39 mmHg (ref 30.0–45.0)

## 2011-11-21 SURGERY — LEFT HEART CATHETERIZATION WITH CORONARY/GRAFT ANGIOGRAM
Anesthesia: LOCAL

## 2011-11-21 MED ORDER — SODIUM CHLORIDE 0.9 % IV SOLN: 250.0000 mL | INTRAVENOUS | Status: DC | PRN

## 2011-11-21 MED ORDER — LIDOCAINE HCL (PF) 1 % IJ SOLN
INTRAMUSCULAR | Status: AC
  Filled 2011-11-21: qty 30

## 2011-11-21 MED ORDER — SODIUM CHLORIDE 0.9 % IJ SOLN: 3.0000 mL | INTRAMUSCULAR | Status: DC | PRN

## 2011-11-21 MED ORDER — NITROGLYCERIN 0.2 MG/ML ON CALL CATH LAB
INTRAVENOUS | Status: AC
  Filled 2011-11-21: qty 1

## 2011-11-21 MED ORDER — FENTANYL CITRATE 0.05 MG/ML IJ SOLN
INTRAMUSCULAR | Status: AC
  Filled 2011-11-21: qty 2

## 2011-11-21 MED ORDER — ONDANSETRON HCL 4 MG/2ML IJ SOLN
INTRAMUSCULAR | Status: AC
  Filled 2011-11-21: qty 2

## 2011-11-21 MED ORDER — CLOPIDOGREL BISULFATE 300 MG PO TABS
ORAL_TABLET | ORAL | Status: AC
  Filled 2011-11-21: qty 1

## 2011-11-21 MED ORDER — MIDAZOLAM HCL 2 MG/2ML IJ SOLN
INTRAMUSCULAR | Status: AC
  Filled 2011-11-21: qty 2

## 2011-11-21 MED ORDER — CARVEDILOL 3.125 MG PO TABS
3.1250 mg | ORAL_TABLET | Freq: Two times a day (BID) | ORAL | Status: DC
  Administered 2011-11-22: 3.125 mg via ORAL
  Filled 2011-11-21 (×4): qty 1

## 2011-11-21 MED ORDER — SODIUM CHLORIDE 0.9 % IJ SOLN: 3.0000 mL | Freq: Two times a day (BID) | INTRAMUSCULAR | Status: DC

## 2011-11-21 MED ORDER — SODIUM CHLORIDE 0.9 % IV SOLN
1.0000 mL/kg/h | INTRAVENOUS | Status: DC
  Administered 2011-11-21: 1 mL/kg/h via INTRAVENOUS

## 2011-11-21 MED ORDER — CLOPIDOGREL BISULFATE 75 MG PO TABS
75.0000 mg | ORAL_TABLET | Freq: Every day | ORAL | Status: DC
  Administered 2011-11-22: 75 mg via ORAL
  Filled 2011-11-21: qty 1

## 2011-11-21 MED ORDER — SODIUM CHLORIDE 0.9 % IV SOLN
INTRAVENOUS | Status: AC
  Administered 2011-11-21: 13:00:00 via INTRAVENOUS

## 2011-11-21 MED ORDER — HEPARIN (PORCINE) IN NACL 2-0.9 UNIT/ML-% IJ SOLN
INTRAMUSCULAR | Status: AC
  Filled 2011-11-21: qty 2000

## 2011-11-21 MED ORDER — CLOPIDOGREL BISULFATE 75 MG PO TABS
300.0000 mg | ORAL_TABLET | Freq: Once | ORAL | Status: AC
  Administered 2011-11-21: 300 mg via ORAL

## 2011-11-21 NOTE — H&P (View-Only) (Signed)
     Cardiology Progress Note Patient Name: [name] Date of Encounter: 11/21/2011, 8:46 AM     Subjective  No overnight events. Patient remains dyspneic with chest tightness with exertion. Ready for cath   Objective   Telemetry: sinus rhythm 60-80s  Medications: . amLODipine  2.5 mg Oral Daily  . heparin  5,000 Units Subcutaneous Q8H  . insulin aspart  0-15 Units Subcutaneous TID WC  . insulin glargine  10 Units Subcutaneous QHS  . omega-3 acid ethyl esters  1 g Oral BID  . sodium chloride  3 mL Intravenous Q12H  . sodium chloride  3 mL Intravenous Q12H  . DISCONTD: isosorbide mononitrate  30 mg Oral Daily   . sodium chloride 1 mL/kg/hr (11/21/11 0407)    Physical Exam: Temp:  [97.6 F (36.4 C)-98.1 F (36.7 C)] 98 F (36.7 C) (09/09 0500) Pulse Rate:  [78-97] 97  (09/09 0500) Resp:  [18] 18  (09/09 0500) BP: (101-127)/(55-72) 127/70 mmHg (09/09 0500) SpO2:  [97 %-98 %] 97 % (09/09 0500) Weight:  [197 lb 8.5 oz (89.6 kg)-197 lb 12 oz (89.7 kg)] 197 lb 8.5 oz (89.6 kg) (09/09 0500)  General: Pleasant white female, in no acute distress. Head: Normocephalic, atraumatic, sclera non-icteric, nares are without discharge.  Neck: Supple. Negative for carotid bruits or JVD Lungs: Clear bilaterally to auscultation without wheezes, rales, or rhonchi. Breathing is unlabored. Heart: RRR S1 S2 without murmurs, rubs, or gallops.  Abdomen: Soft, non-tender, non-distended with normoactive bowel sounds. No rebound/guarding. No obvious abdominal masses. Msk:  Strength and tone appear normal for age. Extremities: No edema. No clubbing or cyanosis. Distal pedal pulses are intact and equal bilaterally. Neuro: Alert and oriented X 3. Moves all extremities spontaneously. Psych:  Responds to questions appropriately with a normal affect.   Intake/Output Summary (Last 24 hours) at 11/21/11 0846 Last data filed at 11/21/11 0800  Gross per 24 hour  Intake 1374.05 ml  Output      0 ml    Net 1374.05 ml    Labs:  Basename 11/19/11 0212 11/18/11 1741 11/18/11 1254  NA 140 -- 138  K 3.5 -- 3.9  CL 105 -- 104  CO2 25 -- 22  GLUCOSE 134* -- 113*  BUN 17 -- 15  CREATININE 0.57 0.49* --  CALCIUM 9.1 -- 9.3   Basename 11/19/11 0212 11/18/11 1741 11/18/11 1254  WBC 5.8 6.2 --  NEUTROABS -- -- 4.2  HGB 11.8* 12.0 --  HCT 35.0* 34.8* --  MCV 85.4 85.9 --  PLT 157 161 --   Basename 11/19/11 0212 11/18/11 2023 11/18/11 1301  CKTOTAL 47 54 --  CKMB 2.1 2.2 --  TROPONINI <0.30 <0.30 <0.30   Basename 11/18/11 1336  DDIMER 0.31   Basename 11/20/11 1159  CHOL 289*  HDL 51  LDLCALC 198*  TRIG 201*  CHOLHDL 5.7     11/18/2011 13:01  Pro B Natriuretic peptide (BNP) 236.6 (H)   Radiology/Studies:   9/4/201 -  Chest 2 View Findings: Trachea is midline.  Heart size normal.  Minimal biapical pleural thickening.  Lungs are clear.  No pleural fluid.  Calcified breast prostheses are seen bilaterally.  IMPRESSION: No acute findings.    .      Assessment and Plan   1. Dyspnea  2. Chest tightness  3. Coronary Artery Disease s/p 3v CABG 03/2010  4. Diabetes Mellitus  5. Hyperlipidemia w/ statin intolerance  6. ASA allergy  Cardiac enzymes and DDimer normal.   CXR clear. BNP mildly elevated. No overt volume overload on exam. Plans for right and left cardiac cath today. Further plans pending results. BP stable on amlodipine. Cont Lantus and SSI. LDL 198, goal < 70. Intolerant to statins in the past.  Signed, HOPE, JESSICA PA-C  I have personally seen and examined this patient. I agree with the assessment and plan as outlined above. Plans for right and left heart cath this am. Risks and benefits reviewed with pt and she agrees to proceed.   MCALHANY,CHRISTOPHER 9:41 AM 11/21/2011  

## 2011-11-21 NOTE — Progress Notes (Signed)
Notified by nurse that pt has had new diarrhea this evening since 6pm. No obvious culprit meds started. No nausea or vomiting. Was admitted 09/2011 with PNA, diarrhea with negative stool cx and negative c diff at that time. For now, given short duration of symptoms this admission will err on side of conservative management and watch for now. She is taking PO's. If diarrhea persists, may need repeat culturing. BMET ordered given borderline K of 3.5 several days ago to r/o significant GI losses or any other obvious metabolic abnormalities. Deanna Wiater PA-C

## 2011-11-21 NOTE — CV Procedure (Signed)
Cardiac Catheterization Operative Report  [name] [id number] 9/9/201310:52 AM [name]  Procedure Performed:  1. Left Heart Catheterization 2. Selective Coronary Angiography 3. Right Heart Catheterization 4. Left ventricular angiogram  Operator: Verne Carrow, MD  Indication:  Known CAD with prior 3V CABG in 2012. Pt with dyspnea and chest pressure over last few weeks. Cardiac markers negative.                                 Procedure Details: The risks, benefits, complications, treatment options, and expected outcomes were discussed with the patient. The patient and/or family concurred with the proposed plan, giving informed consent. The patient was brought to the cath lab after IV hydration was begun and oral premedication was given. The patient was further sedated with Versed and Fentanyl. The right groin was prepped and draped in the usual manner. Using the modified Seldinger access technique, a 5 French sheath was placed in the right femoral artery. A 7 French sheath was inserted into the right femoral vein. A balloon tipped catheter was used to perform a right heart catheterization. I was unable to advance the balloon tipped catheter into the PA. I then used a 5Fr multi-purpose catheter the engage the PA and measure pressures. Standard diagnostic catheters were used to perform selective coronary angiography. The JR-4 catheter was used to engage both vein grafts. I had considerable difficulty advancing a catheter into the left subclavian artery secondary a fold in the proximal vessel. I was ultimately able to engage the vessel with the JR4 catheter and used a Glide wire to gain access into the vessel. I then changed the catheter over an exchange wire for an IMA catheter and performed selective angiography of the LIMA graft.  A pigtail catheter was used to perform a left ventricular angiogram. There were no immediate complications. The patient was taken to the  recovery area in stable condition.   Hemodynamic Findings: Ao:  174/79            LV:  164/8/15 RA:   4             RV: 25/2/5 PA:  24/11 (mean 17)      PCWP:  7 Fick Cardiac Output: 6.6 L/min Fick Cardiac Index: 3.3 L/min/m2 Central Aortic Saturation: 97% Pulmonary Artery Saturation: 72 %   Angiographic Findings:  Left main: Distal 30% stenosis.   Left Anterior Descending Artery: Moderate sized vessel with diffuse disease extending from the ostium down to the apex. The proximal vessel has diffuse 70% stenosis. The mid vessel has 100% occlusion. The diagonal branch is small in caliber, diffusely diseased and fills antegrade.  Circumflex Artery: Moderate sized vessel with 50% ostial stenosis. The first obtuse marginal branch is occluded at the ostium and fills from the vein graft. The mid vessel has a 70-80% stenosis at the takeoff of the small caliber second marginal branch. The small caliber second marginal branch has diffuse 90% narrowing proximally. The severe stenosis in the mid vessel appears unchanged from last angiogram.   Right Coronary Artery: Small, non-dominant vessel with diffuse disease in the proximal vessel and 100% total occlusion in the mid vessel.   Graft Anatomy:  SVG to diagonal branch is occluded SVG to first OM branch is patent with 40% stenosis in mid body of vein graft. The target vessel is small with diffuse disease. LIMA to mid LAD is patent. The distal LAD is diffusely diseased  beyond the graft insertion.  Left Ventricular Angiogram: LVEF 45-50%   Impression: 1. Severe triple vessel CAD s/p 3V CABG with 2/3 patent bypass grafts.  2. 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 3. Mild LV systolic dysfunction.   Recommendations: She does not have good targets for PCI. The mid Circumflex stenosis could be approached with PCI, however, this stenosis is unchanged from cath 2 years ago and is most likely not the reason for her recent change in symptoms. She  has been off of all cardiac meds since her cholecystectomy in August 2012. Will resume Plavix as she is intolerant to ASA. Will resume beta blocker. Will continue Norvasc. She has not tolerated statins in the past. Will not start statin. She may need another trial of long acting nitrate. She tried it over the weekend and had a bad headache.        Complications:  None; patient tolerated the procedure well.

## 2011-11-21 NOTE — Progress Notes (Signed)
Cardiology Progress Note Patient Name: [name] Date of Encounter: 11/21/2011, 8:46 AM     Subjective  No overnight events. Patient remains dyspneic with chest tightness with exertion. Ready for cath   Objective   Telemetry: sinus rhythm 60-80s  Medications: . amLODipine  2.5 mg Oral Daily  . heparin  5,000 Units Subcutaneous Q8H  . insulin aspart  0-15 Units Subcutaneous TID WC  . insulin glargine  10 Units Subcutaneous QHS  . omega-3 acid ethyl esters  1 g Oral BID  . sodium chloride  3 mL Intravenous Q12H  . sodium chloride  3 mL Intravenous Q12H  . DISCONTD: isosorbide mononitrate  30 mg Oral Daily   . sodium chloride 1 mL/kg/hr (11/21/11 0407)    Physical Exam: Temp:  [97.6 F (36.4 C)-98.1 F (36.7 C)] 98 F (36.7 C) (09/09 0500) Pulse Rate:  [78-97] 97  (09/09 0500) Resp:  [18] 18  (09/09 0500) BP: (101-127)/(55-72) 127/70 mmHg (09/09 0500) SpO2:  [97 %-98 %] 97 % (09/09 0500) Weight:  [197 lb 8.5 oz (89.6 kg)-197 lb 12 oz (89.7 kg)] 197 lb 8.5 oz (89.6 kg) (09/09 0500)  General: Pleasant white female, in no acute distress. Head: Normocephalic, atraumatic, sclera non-icteric, nares are without discharge.  Neck: Supple. Negative for carotid bruits or JVD Lungs: Clear bilaterally to auscultation without wheezes, rales, or rhonchi. Breathing is unlabored. Heart: RRR S1 S2 without murmurs, rubs, or gallops.  Abdomen: Soft, non-tender, non-distended with normoactive bowel sounds. No rebound/guarding. No obvious abdominal masses. Msk:  Strength and tone appear normal for age. Extremities: No edema. No clubbing or cyanosis. Distal pedal pulses are intact and equal bilaterally. Neuro: Alert and oriented X 3. Moves all extremities spontaneously. Psych:  Responds to questions appropriately with a normal affect.   Intake/Output Summary (Last 24 hours) at 11/21/11 0846 Last data filed at 11/21/11 0800  Gross per 24 hour  Intake 1374.05 ml  Output      0 ml    Net 1374.05 ml    Labs:  Basename 11/19/11 0212 11/18/11 1741 11/18/11 1254  NA 140 -- 138  K 3.5 -- 3.9  CL 105 -- 104  CO2 25 -- 22  GLUCOSE 134* -- 113*  BUN 17 -- 15  CREATININE 0.57 0.49* --  CALCIUM 9.1 -- 9.3   Basename 11/19/11 0212 11/18/11 1741 11/18/11 1254  WBC 5.8 6.2 --  NEUTROABS -- -- 4.2  HGB 11.8* 12.0 --  HCT 35.0* 34.8* --  MCV 85.4 85.9 --  PLT 157 161 --   Basename 11/19/11 0212 11/18/11 2023 11/18/11 1301  CKTOTAL 47 54 --  CKMB 2.1 2.2 --  TROPONINI <0.30 <0.30 <0.30   Basename 11/18/11 1336  DDIMER 0.31   Basename 11/20/11 1159  CHOL 289*  HDL 51  LDLCALC 198*  TRIG 201*  CHOLHDL 5.7     11/18/2011 13:01  Pro B Natriuretic peptide (BNP) 236.6 (H)   Radiology/Studies:   9/4/201 -  Chest 2 View Findings: Trachea is midline.  Heart size normal.  Minimal biapical pleural thickening.  Lungs are clear.  No pleural fluid.  Calcified breast prostheses are seen bilaterally.  IMPRESSION: No acute findings.    .      Assessment and Plan   1. Dyspnea  2. Chest tightness  3. Coronary Artery Disease s/p 3v CABG 03/2010  4. Diabetes Mellitus  5. Hyperlipidemia w/ statin intolerance  6. ASA allergy  Cardiac enzymes and DDimer normal.  CXR clear. BNP mildly elevated. No overt volume overload on exam. Plans for right and left cardiac cath today. Further plans pending results. BP stable on amlodipine. Cont Lantus and SSI. LDL 198, goal < 70. Intolerant to statins in the past.  Signed, HOPE, JESSICA PA-C  I have personally seen and examined this patient. I agree with the assessment and plan as outlined above. Plans for right and left heart cath this am. Risks and benefits reviewed with pt and she agrees to proceed.   MCALHANY,CHRISTOPHER 9:41 AM 11/21/2011

## 2011-11-21 NOTE — Interval H&P Note (Signed)
History and Physical Interval Note:  11/21/2011 9:42 AM  [name]  has presented today for surgery, with the diagnosis of cp  The various methods of treatment have been discussed with the patient and family. After consideration of risks, benefits and other options for treatment, the patient has consented to  Procedure(s) (LRB) with comments: LEFT HEART CATHETERIZATION WITH CORONARY/GRAFT ANGIOGRAM (N/A) as a surgical intervention .  The patient's history has been reviewed, patient examined, no change in status, stable for surgery.  I have reviewed the patient's chart and labs.  Questions were answered to the patient's satisfaction.     [name]

## 2011-11-22 ENCOUNTER — Telehealth: Payer: Self-pay | Admitting: Cardiology

## 2011-11-22 LAB — GLUCOSE, CAPILLARY
Glucose-Capillary: 118 mg/dL — ABNORMAL HIGH (ref 70–99)
Glucose-Capillary: 146 mg/dL — ABNORMAL HIGH (ref 70–99)

## 2011-11-22 MED ORDER — AMLODIPINE BESYLATE 2.5 MG PO TABS: 2.5000 mg | ORAL_TABLET | Freq: Every day | ORAL | Status: DC

## 2011-11-22 MED ORDER — NITROGLYCERIN 0.4 MG SL SUBL: 0.4000 mg | SUBLINGUAL_TABLET | SUBLINGUAL | Status: DC | PRN

## 2011-11-22 MED ORDER — CLOPIDOGREL BISULFATE 75 MG PO TABS: 75.0000 mg | ORAL_TABLET | Freq: Every day | ORAL | Status: AC

## 2011-11-22 MED ORDER — CARVEDILOL 3.125 MG PO TABS: 3.1250 mg | ORAL_TABLET | Freq: Two times a day (BID) | ORAL | Status: DC

## 2011-11-22 NOTE — Telephone Encounter (Signed)
New problem:  C/o side effect to medication- breaking out in hives,  Recently discharge from hospital. Daughter is not with her at the moment.

## 2011-11-22 NOTE — Progress Notes (Signed)
SUBJECTIVE:  No chest pain.  No sob   PHYSICAL EXAM Filed Vitals:   11/21/11 1300 11/21/11 1700 11/21/11 2100 11/22/11 0500  BP: 164/60 113/71 115/72 111/74  Pulse: 72 81 79 79  Temp: 97.5 F (36.4 C) 98.1 F (36.7 C) 98.2 F (36.8 C) 97.8 F (36.6 C)  TempSrc:   Oral Oral  Resp: 18 18 18 18   Height:      Weight:    190 lb 14.4 oz (86.592 kg)  SpO2: 98% 93% 95% 97%   General:  No distress Lungs:  Clear Heart:  RRR Abdomen:  Positive bowel sounds, no rebound no guarding Extremities:  Right femoral site OK.  LABS: Lab Results  Component Value Date   CKTOTAL 47 11/19/2011   CKMB 2.1 11/19/2011   TROPONINI <0.30 11/19/2011   Results for orders placed during the hospital encounter of 11/18/11 (from the past 24 hour(s))  POCT I-STAT 3, BLOOD GAS (G3+)     Status: Normal   Collection Time   11/21/11 10:14 AM      Component Value Range   pH, Arterial 7.356  7.350 - 7.450   pCO2 arterial 41.2  35.0 - 45.0 mmHg   pO2, Arterial 95.0  80.0 - 100.0 mmHg   Bicarbonate 23.1  20.0 - 24.0 mEq/L   TCO2 24  0 - 100 mmol/L   O2 Saturation 97.0     Acid-base deficit 2.0  0.0 - 2.0 mmol/L   Sample type ARTERIAL    POCT I-STAT 3, BLOOD GAS (G3P V)     Status: Abnormal   Collection Time   11/21/11 10:22 AM      Component Value Range   pH, Ven 7.369 (*) 7.250 - 7.300   pCO2, Ven 42.5 (*) 45.0 - 50.0 mmHg   pO2, Ven 39.0  30.0 - 45.0 mmHg   Bicarbonate 24.5 (*) 20.0 - 24.0 mEq/L   TCO2 26  0 - 100 mmol/L   O2 Saturation 72.0     Acid-base deficit 1.0  0.0 - 2.0 mmol/L   Sample type VENOUS     Comment NOTIFIED PHYSICIAN    GLUCOSE, CAPILLARY     Status: Abnormal   Collection Time   11/21/11 11:13 AM      Component Value Range   Glucose-Capillary 118 (*) 70 - 99 mg/dL  GLUCOSE, CAPILLARY     Status: Abnormal   Collection Time   11/21/11  4:24 PM      Component Value Range   Glucose-Capillary 109 (*) 70 - 99 mg/dL  GLUCOSE, CAPILLARY     Status: Abnormal   Collection Time   11/21/11   8:38 PM      Component Value Range   Glucose-Capillary 128 (*) 70 - 99 mg/dL   Comment 1 Notify RN    BASIC METABOLIC PANEL     Status: Abnormal   Collection Time   11/21/11 10:40 PM      Component Value Range   Sodium 139  135 - 145 mEq/L   Potassium 4.0  3.5 - 5.1 mEq/L   Chloride 106  96 - 112 mEq/L   CO2 20  19 - 32 mEq/L   Glucose, Bld 131 (*) 70 - 99 mg/dL   BUN 19  6 - 23 mg/dL   Creatinine, Ser 9.60  0.50 - 1.10 mg/dL   Calcium 9.3  8.4 - 45.4 mg/dL   GFR calc non Af Amer 85 (*) >90 mL/min   GFR calc Af  Amer >90  >90 mL/min  GLUCOSE, CAPILLARY     Status: Abnormal   Collection Time   11/22/11  7:22 AM      Component Value Range   Glucose-Capillary 118 (*) 70 - 99 mg/dL    Intake/Output Summary (Last 24 hours) at 11/22/11 1914 Last data filed at 11/21/11 1300  Gross per 24 hour  Intake    360 ml  Output      0 ml  Net    360 ml     ASSESSMENT AND PLAN:  CAD - Medical management. I reviewed the films.  Plavix and Norvasc have been added.  She did not tolerate Imdur  Diarrhea -  This is resolved.  Follow up with me in two weeks.  Fayrene Fearing [facility] 11/22/2011 8:12 AM

## 2011-11-22 NOTE — Care Management Note (Signed)
    Page 1 of 1   11/22/2011     11:12:57 AM   CARE MANAGEMENT NOTE 11/22/2011  Patient:  [name], [name]   Account Number:  [id number]  Date Initiated:  11/22/2011  Documentation initiated by:  GRAVES-BIGELOW,Atalia Litzinger  Subjective/Objective Assessment:   Pt admitted for cp. Post cath and plan for home today.     Action/Plan:   No needs from CM at this time.   Anticipated DC Date:  11/22/2011   Anticipated DC Plan:  HOME/SELF CARE      DC Planning Services  CM consult      Choice offered to / List presented to:             Status of service:  Completed, signed off Medicare Important Message given?   (If response is "NO", the following Medicare IM given date fields will be blank) Date Medicare IM given:   Date Additional Medicare IM given:    Discharge Disposition:  HOME/SELF CARE  Per UR Regulation:  Reviewed for med. necessity/level of care/duration of stay  If discussed at Long Length of Stay Meetings, dates discussed:    Comments:

## 2011-11-22 NOTE — Progress Notes (Signed)
DC orders received.  Patient stable with no S/S of distress.  Medication and discharge information reviewed with patient and patient's daughter.  Patient DC home. Nolon Nations

## 2011-11-22 NOTE — Telephone Encounter (Signed)
Per pt - she broke out in hives on the way home from the hospital.  She reports that she feels fine at this time and the hives resolved on their own.  She will call back if further problems.  She was instructed to space out her new medications tomorrow so that we might be able to determine which medication is causing the problem.

## 2011-11-22 NOTE — Discharge Summary (Signed)
Discharge Summary   Patient ID: [name] MRN: [id number], DOB/AGE: 1945/10/28 66 y.o.  Primary MD: [name] Primary Cardiologist: Rollene Rotunda MD Admit date: 11/18/2011 D/C date:     11/22/2011      Primary Discharge Diagnoses:  1. Coronary Artery Disease  - H/o 3V CABG 03/2010  - DDimer and enzymes normal  - Cath 11/21/11 revealed 2/3 patent grafts, chronic occlusion of SVG to diagonal, chronic 70-80% stenosis in mid LCx, EF 45-50%; poor target vessels for PCI  - Medical management; started on Amlodipine, BB, and plavix; Intolerant to Imdur (headache); ASA allergy (hives)  2. Hyperlipidemia, Uncontrolled  - Intolerant to statins (myalgias)  - LDL 198, goal <70  - Discuss options for better control at follow up   Secondary Discharge Diagnoses:   Diabetes Mellitus, Type 2  Orthostatic Hypotension - previous Midodrine therapy   Carotid Artery Stenosis - bilat 40-59%  PVD s/p balloon PCI to left leg  Obesity   Appendectomy    Tonsillectomy    Cholecystectomy 2012  Bilateral Cataract surgery   Allergies Allergies  Allergen Reactions  . Bee Venom Anaphylaxis  . Aspirin Hives  . Codeine Hives  . Cortisone Hives  . Demerol Hives  . Meperidine Hcl Hives  . Prednisone Hives    Diagnostic Studies/Procedures:   11/21/11 - Cardiac Cath Hemodynamic Findings:  Ao: 174/79  LV: 164/8/15  RA: 4  RV: 25/2/5  PA: 24/11 (mean 17)  PCWP: 7  Fick Cardiac Output: 6.6 L/min  Fick Cardiac Index: 3.3 L/min/m2  Central Aortic Saturation: 97%  Pulmonary Artery Saturation: 72 %  Angiographic Findings:  Left main: Distal 30% stenosis.  Left Anterior Descending Artery: Moderate sized vessel with diffuse disease extending from the ostium down to the apex. The proximal vessel has diffuse 70% stenosis. The mid vessel has 100% occlusion. The diagonal branch is small in caliber, diffusely diseased and fills antegrade.  Circumflex Artery: Moderate sized vessel with  50% ostial stenosis. The first obtuse marginal branch is occluded at the ostium and fills from the vein graft. The mid vessel has a 70-80% stenosis at the takeoff of the small caliber second marginal branch. The small caliber second marginal branch has diffuse 90% narrowing proximally. The severe stenosis in the mid vessel appears unchanged from last angiogram.  Right Coronary Artery: Small, non-dominant vessel with diffuse disease in the proximal vessel and 100% total occlusion in the mid vessel.  Graft Anatomy:  SVG to diagonal branch is occluded  SVG to first OM branch is patent with 40% stenosis in mid body of vein graft. The target vessel is small with diffuse disease.  LIMA to mid LAD is patent. The distal LAD is diffusely diseased beyond the graft insertion.  Left Ventricular Angiogram: LVEF 45-50%  Impression:  1. Severe triple vessel CAD s/p 3V CABG with 2/3 patent bypass grafts.  2. 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  3. Mild LV systolic dysfunction.  Recommendations: She does not have good targets for PCI. The mid Circumflex stenosis could be approached with PCI, however, this stenosis is unchanged from cath 2 years ago and is most likely not the reason for her recent change in symptoms. She has been off of all cardiac meds since her cholecystectomy in August 2012. Will resume Plavix as she is intolerant to ASA. Will resume beta blocker. Will continue Norvasc. She has not tolerated statins in the past. Will not start statin. She may need another trial of long acting nitrate. She tried  it over the weekend and had a bad headache.   History of Present Illness: 66 y.o. female w/ the above medical problems who presented to [facility] on 11/18/11 with complaints of dyspnea and chest pain.  She reported having progressive sob over the last 2mos. She was evaluated in the hospital in late July for GI complaints and told she had enteritis and pneumonia for which she was treated  with abx. She continued to have progressive sob that worsened over the last two weeks to the point that she can't do ADLs without significant sob and chest tightness. These are the same symptoms she was having prior to CABG. Denied chest pain, but reported chest tightness associated with her sob. Flew to [place] (6hr plane ride) two weeks ago. Had woken up with sob and chest tightness this week. On day of presentation while showering she was extremely sob prompting her to present to the ED  Hospital Course: In the ED, EKG revealed NSR with no acute ST/T changes. CXR was without acute cardiopulmonary abnormalities. Labs were significant for normal troponin, normal DDimer, pBNP 237, and unremarkable CBC/BMET. Her symptoms were concerning for unstable angina so she was admitted for further evaluation and treatment.   Cardiac enzymes were cycled and remained negative. She noted headache, paresthesias, and malaise that she attributed to the initiation of Imdur during her last clinic visit, so this was discontinued and she was placed on Amlodipine. Right and left heart cath were performed on 9/9 revealing 2/3 patent grafts, chronic occlusion of SVG to diagonal, chronic 70-80% stenosis in mid LCx, mild LV systolic dysfunction, EF 45-50%. It was noted she does not have good targets for PCI and had no obvious source for her symptoms. She tolerated the procedure well without complications. Recommendations were made for initiation of Plavix given her history of intolerance to ASA and initiation of beta blocker.   Her lipid profile showed cholesterol 289, LDL 198, HDL 51, and Triglycerides 161. She reported myalgias with statins in the past and will therefore need to discuss other options for better lipid control at her follow up visit. She was able to ambulate with improved sob and no chest pain. Cath site remained stable. She was seen and evaluated by Dr. Antoine Poche who felt she was stable for discharge home with plans for  follow up as scheduled below.  Discharge Vitals: Blood pressure 110/52, pulse 84, temperature 97.8 F (36.6 C), temperature source Oral, resp. rate 18, height 5\' 5"  (1.651 m), weight 190 lb 14.4 oz (86.592 kg), SpO2 97.00%.  Labs: Component Value Date   WBC 5.8 11/19/2011   HGB 11.8* 11/19/2011   HCT 35.0* 11/19/2011   MCV 85.4 11/19/2011   PLT 157 11/19/2011    Lab 11/21/11 2240  NA 139  K 4.0  CL 106  CO2 20  BUN 19  CREATININE 0.76  CALCIUM 9.3  GLUCOSE 131*   Component Value Date   CHOL 289* 11/20/2011   HDL 51 11/20/2011   LDLCALC 096* 11/20/2011   TRIG 201* 11/20/2011   Component Value Date   DDIMER 0.31 11/18/2011    11/18/2011 13:01 11/18/2011 20:23 11/19/2011 02:12  CK, MB  2.2 2.1  CK Total  54 47  Troponin I <0.30 <0.30 <0.30     11/15/2011 16:12 11/18/2011 13:01  Brain Natriuretic Peptide 29.9   Pro B Natriuretic peptide (BNP)  236.6 (H)     11/15/2011 16:12  TSH 1.370    Discharge Medications   Medication List  As of 11/22/2011 10:51 AM   STOP taking these medications         isosorbide mononitrate 30 MG 24 hr tablet         TAKE these medications         acetaminophen 325 MG tablet   Commonly known as: TYLENOL   Take 650 mg by mouth as needed. For pain      amLODipine 2.5 MG tablet   Commonly known as: NORVASC   Take 1 tablet (2.5 mg total) by mouth daily.      carvedilol 3.125 MG tablet   Commonly known as: COREG   Take 1 tablet (3.125 mg total) by mouth 2 (two) times daily with a meal.      clopidogrel 75 MG tablet   Commonly known as: PLAVIX   Take 1 tablet (75 mg total) by mouth daily with breakfast.      insulin glargine 100 UNIT/ML injection   Commonly known as: LANTUS   Inject 12-20 Units into the skin at bedtime. Based on sugar levels      nitroGLYCERIN 0.4 MG SL tablet   Commonly known as: NITROSTAT   Place 1 tablet (0.4 mg total) under the tongue every 5 (five) minutes as needed for chest pain (up to 3 doses).      OMEGA 3 PO   Take 1,200 Units  by mouth daily.      SYSTANE OP   Place 1 drop into both eyes as needed. For dry eyes            Disposition   Discharge Orders    Future Appointments: Provider: Department: Dept Phone: Center:   12/01/2011 2:45 PM Rollene Rotunda, MD [facility] [phone] [id number]     Future Orders Please Complete By Expires   Diet - low sodium heart healthy      Increase activity slowly      Discharge instructions      Comments:   * KEEP GROIN SITE CLEAN AND DRY. Call the office for any signs of bleedings, pus, swelling, increased pain, or any other concerns. * NO HEAVY LIFTING (>10lbs) OR SEXUAL ACTIVITY X 7 DAYS. * NO DRIVING X 2-3 DAYS. * NO SOAKING BATHS, HOT TUBS, POOLS, ETC., X 7 DAYS.  * Please take all medications as prescribed and notify the office of any concerning side effects.  * Your cholesterol is not controlled on your current regimen. Your LDL (bad cholesterol) is 198 and your goal is <70. Please discuss your options for improved control with your cardiologist and/or primary care provider.   *PLEASE REMEMBER TO BRING ALL OF YOUR MEDICATIONS TO EACH OF YOUR FOLLOW-UP OFFICE VISITS.     Follow-up Information    Follow up with Rollene Rotunda, MD on 12/01/2011. (2:45)    Contact information:   [facility] [address] [place] [address] [phone]       Follow up with [name]. (As needed)    Contact information:   [employer] [place] [address]           Outstanding Labs/Studies:  None  Duration of Discharge Encounter: Greater than 30 minutes including physician and PA time.  Signed, HOPE, JESSICA PA-C 11/22/2011, 10:51 AM  Patient seen and examined.  Plan as discussed in my rounding note for today and outlined above. Rollene Rotunda  11/22/2011  5:42 PM

## 2011-11-23 IMAGING — CR DG HAND COMPLETE 3+V*L*
3 series · 3 of 3 positions shown · non-contrast
Comparison: None.

CLINICAL DATA: Pain.  History trauma not provided.

LEFT HAND - COMPLETE 3+ VIEW

[x hand pa left]
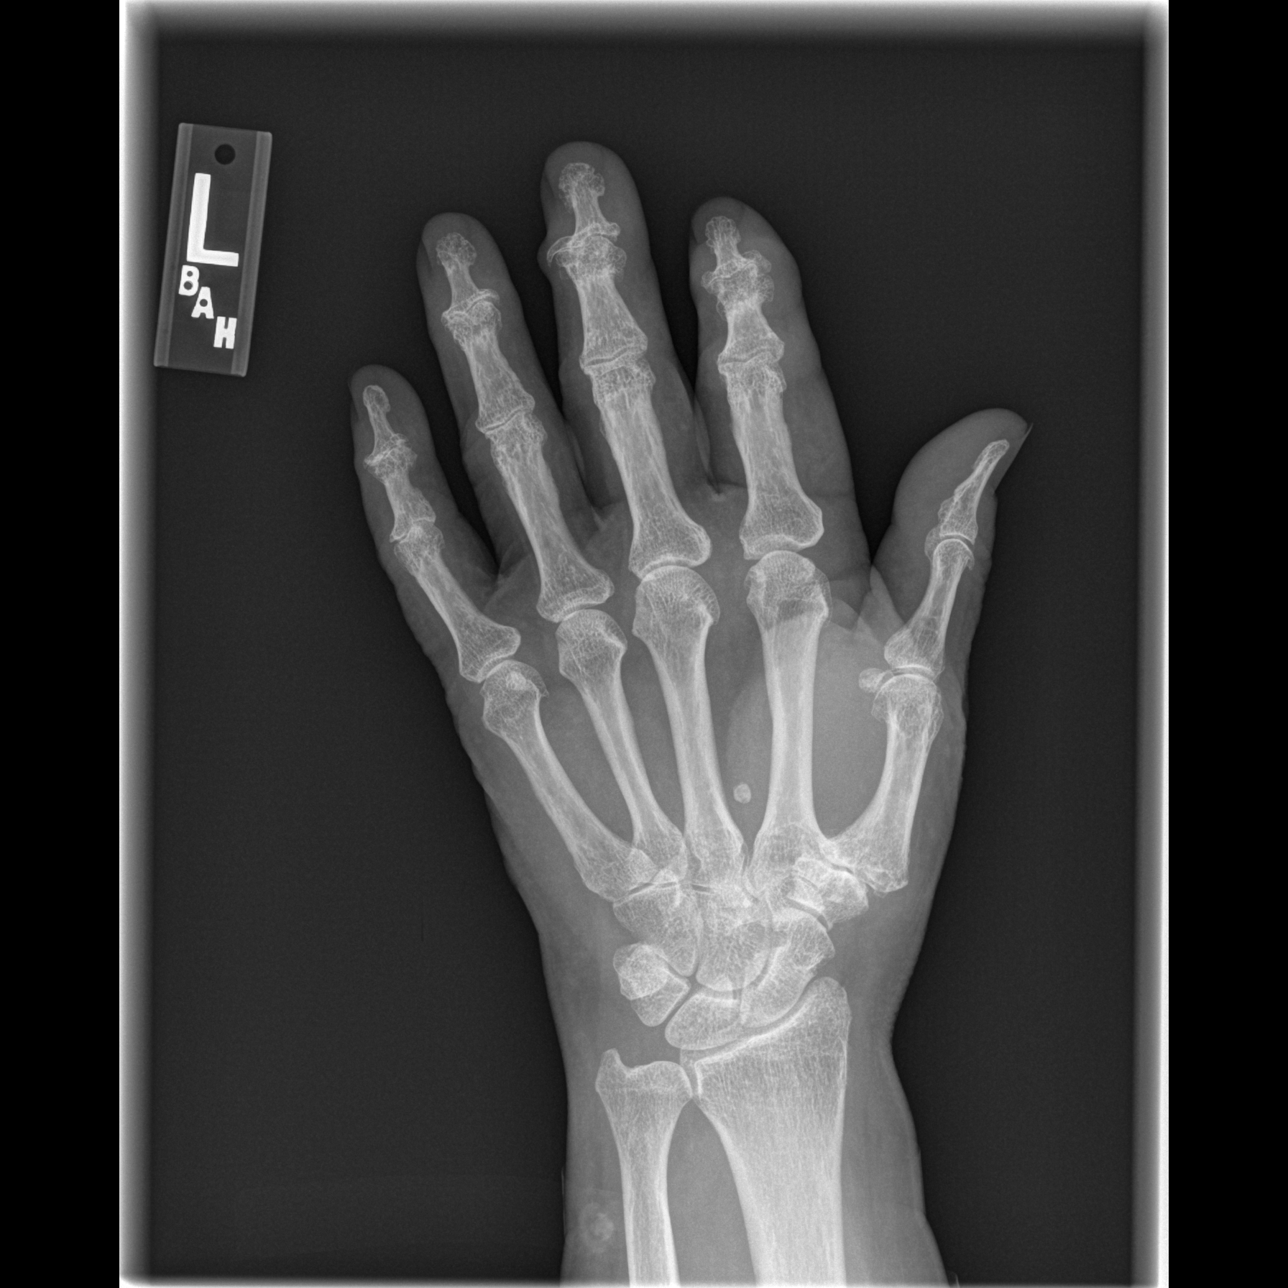

[x hand oblique left]
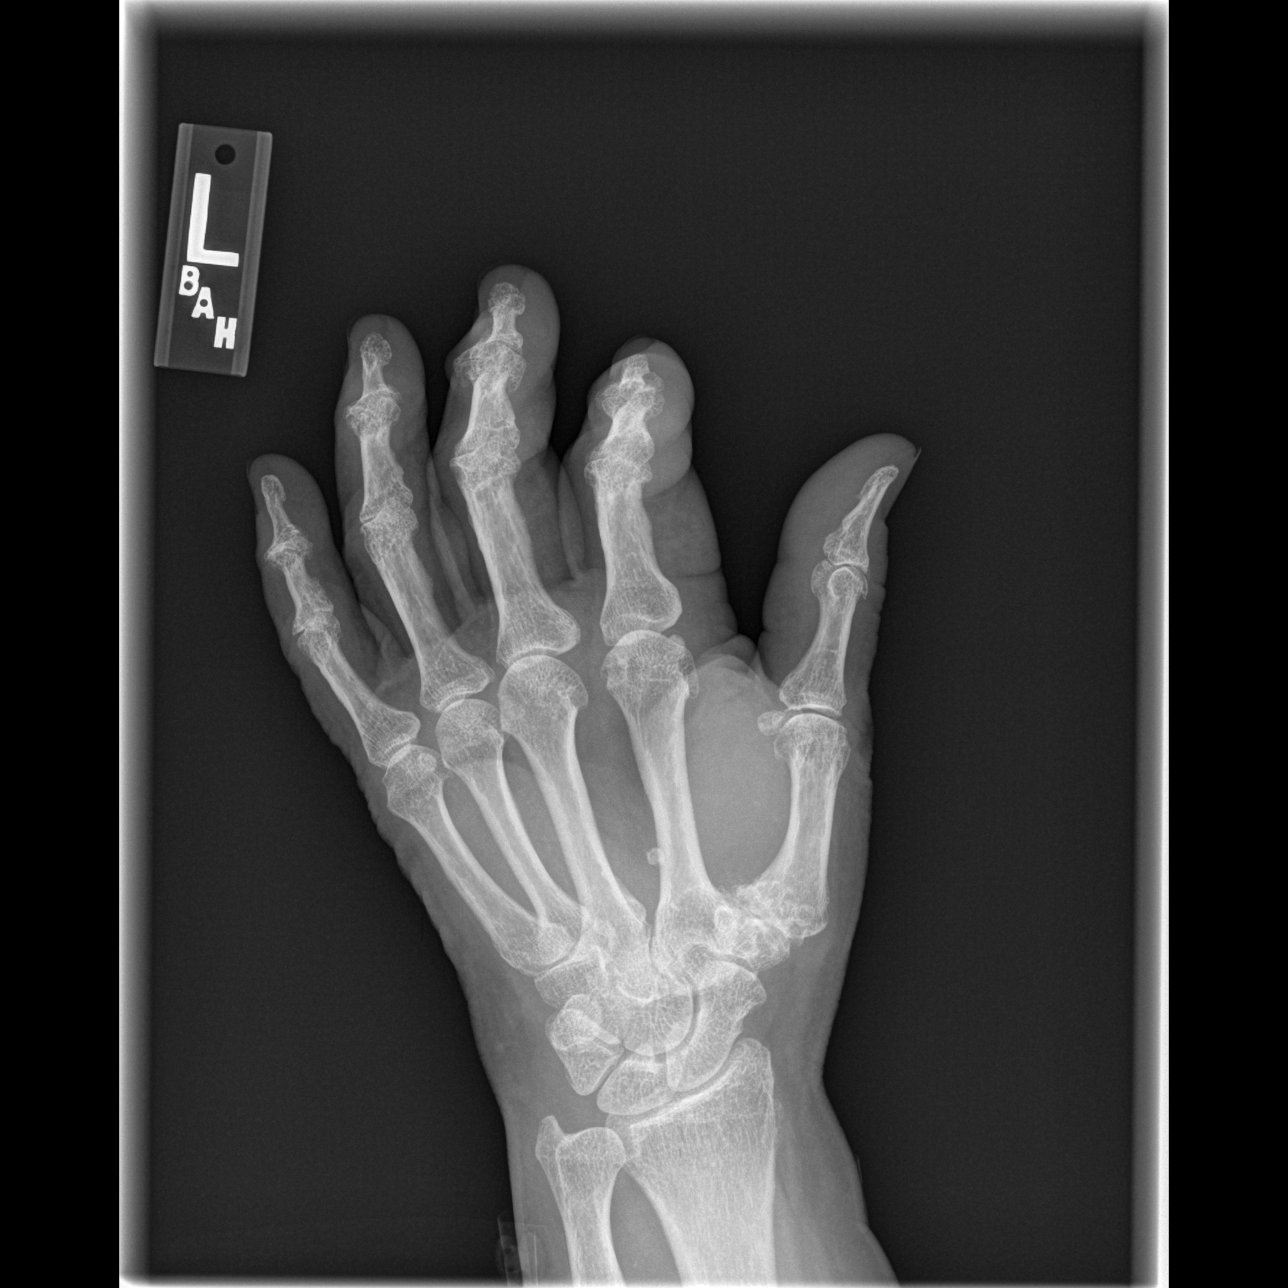

[x hand lat left]
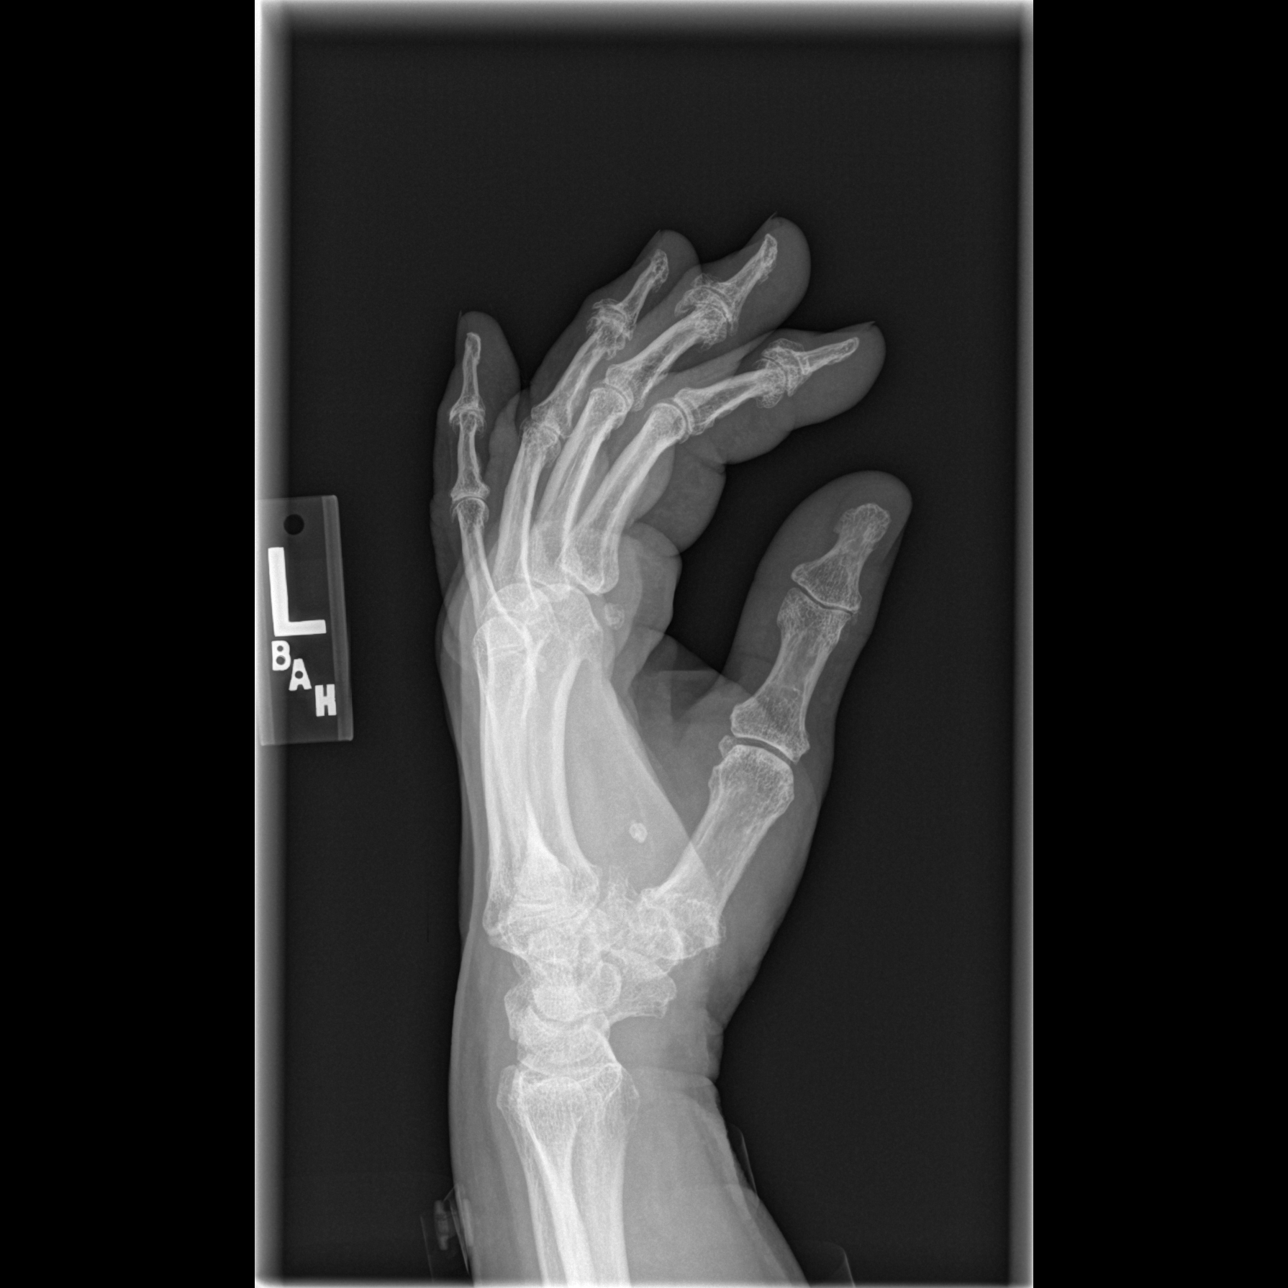

[3 of 3 positions shown; findings below may reference images not displayed]

FINDINGS: Degenerative changes with joint space narrowing and
osteophyte distal interphalangeal joint spaces first through fifth.
Degenerative changes first metacarpal carpal articulation.
IMPRESSION: Degenerative changes with joint space narrowing and osteophyte
distal interphalangeal joint spaces first through fifth.

Degenerative changes first metacarpal carpal articulation.

## 2011-11-24 ENCOUNTER — Ambulatory Visit: Payer: Medicare HMO | Admitting: Nurse Practitioner

## 2011-11-25 IMAGING — CR DG CHEST 1V PORT
1 series · 1 of 1 positions shown · non-contrast
Comparison: The chest radiograph 03/30/2010

CLINICAL DATA: CABG

PORTABLE CHEST - 1 VIEW

[view not recorded]
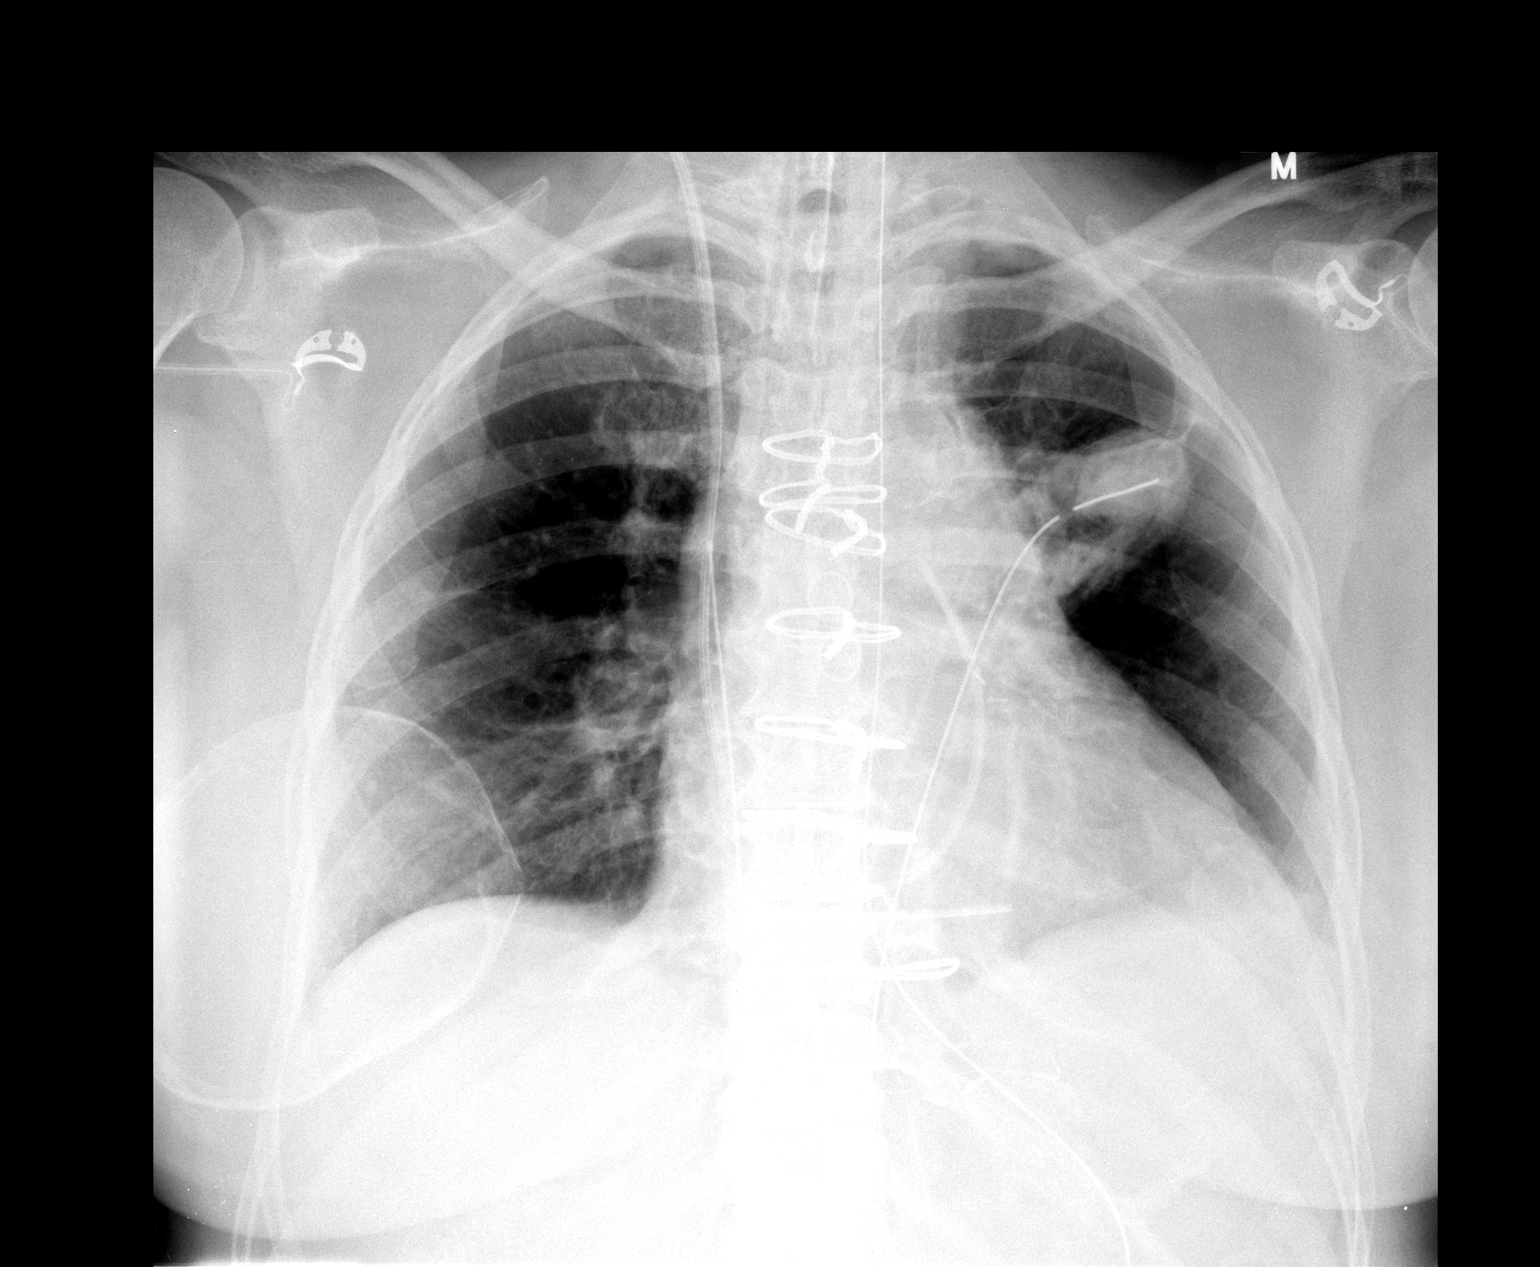

[1 of 1 positions shown; findings below may reference images not displayed]

FINDINGS: Postsurgical findings consistent with CABG.  Endotracheal
tube is 5 cm from carina.  Swan-Ganz catheter with tip in the main
pulmonary artery.  Mediastinal drain and NG tube are in good
position.  Left chest tube in place.  Ovoid opacity associated with
tip of the left chest tube may represent pleural fluid or
atelectasis or pulmonary contusion.  No evidence of pneumothorax.
No pulmonary edema.  Mild left basilar atelectasis .
IMPRESSION: 1.  Tubes and lines in proper position following CABG.
2.  Ovoid opacity associated with the tip of the left chest tube
may represent  pleural fluid,  atelectasis, or  pulmonary
contusion.

## 2011-12-01 ENCOUNTER — Encounter: Payer: Self-pay | Admitting: Cardiology

## 2011-12-01 ENCOUNTER — Ambulatory Visit (INDEPENDENT_AMBULATORY_CARE_PROVIDER_SITE_OTHER): Payer: Medicare HMO | Admitting: Cardiology

## 2011-12-01 VITALS — BP 119/62 | HR 92 | Ht 66.0 in | Wt 199.8 lb

## 2011-12-01 DIAGNOSIS — I951 Orthostatic hypotension: Secondary | ICD-10-CM

## 2011-12-01 DIAGNOSIS — E785 Hyperlipidemia, unspecified: Secondary | ICD-10-CM

## 2011-12-01 DIAGNOSIS — I2510 Atherosclerotic heart disease of native coronary artery without angina pectoris: Secondary | ICD-10-CM

## 2011-12-01 DIAGNOSIS — I6529 Occlusion and stenosis of unspecified carotid artery: Secondary | ICD-10-CM

## 2011-12-01 NOTE — Progress Notes (Signed)
HPI The patient presents for followup of coronary disease status post CABG and a repeat hospitalization with cath.  She presented with chest pain and was found to have anatomy as described below. This was not amenable to percutaneous revascularization she was managed medically. She was started on Plavix. She didn't tolerate Imdur. Since that time she's done well. She says her breathing is better. She's having less chest discomfort. Some of the discomfort she has is somewhat atypical with sharp discomfort with inspiration. Of note her LDL was 198 in the hospital but she does not tolerate statins. She says she is predominantly a vegetarian now. She does have occasional wheezing when she lies flat but not PND or orthopnea. She burps when she sits up. She's had no new swelling.  Allergies  Allergen Reactions  . Bee Venom Anaphylaxis  . Aspirin Hives  . Codeine Hives  . Cortisone Hives  . Demerol Hives  . Meperidine Hcl Hives  . Prednisone Hives    Current Outpatient Prescriptions  Medication Sig Dispense Refill  . acetaminophen (TYLENOL) 325 MG tablet Take 650 mg by mouth as needed. For pain      . amLODipine (NORVASC) 2.5 MG tablet Take 1 tablet (2.5 mg total) by mouth daily.  30 tablet  6  . carvedilol (COREG) 3.125 MG tablet Take 1 tablet (3.125 mg total) by mouth 2 (two) times daily with a meal.  60 tablet  6  . clopidogrel (PLAVIX) 75 MG tablet Take 1 tablet (75 mg total) by mouth daily with breakfast.  30 tablet  6  . insulin glargine (LANTUS) 100 UNIT/ML injection Inject 12-20 Units into the skin at bedtime. Based on sugar levels      . nitroGLYCERIN (NITROSTAT) 0.4 MG SL tablet Place 1 tablet (0.4 mg total) under the tongue every 5 (five) minutes as needed for chest pain (up to 3 doses).  25 tablet  3  . Omega-3 Fatty Acids (OMEGA 3 PO) Take 1,200 Units by mouth daily.        Bertram Gala Glycol-Propyl Glycol (SYSTANE OP) Place 1 drop into both eyes as needed. For dry eyes         Past Medical History  Diagnosis Date  . CAD (coronary artery disease)     S/P CABG January 2012; had poor target vessels for grafting.  A cath  9/13, LAD 100%, diagonal occluded, diffuse second marginal 90% stenosis, first obtuse marginal occluded, nondominant right coronary occluded, saphenous vein graft to the diagonal occluded, saphenous vein graft to OM 40% stenosis, LIMA to the LAD patent   . S/P cardiac catheterization 03/2010    EF 50%   . Orthostatic hypotension     previous Midodrine therapy  . DM (diabetes mellitus)   . Obesity   . HLD (hyperlipidemia)     statin intolerant  . Aspirin allergy   . PVD (peripheral vascular disease)     prior balloon PCI to left leg in [place]  . Anginal pain     Past Surgical History  Procedure Date  . Coronary artery bypass graft 03/2010  . Cardiac catheterization 03/2010  . Carotid dopplers 03/2010    40-59% bilateral ICA stenosis  . Appendectomy   . Tonsillectomy   . Cholecystectomy 2012  . Other surgical history     cataract; bilateral  . Other surgical history     angioplasty left leg  . Eye surgery     laser  . Tubal ligation   . Angioplasty /  stenting femoral 03/14/2011    ROS:  As stated in the HPI and negative for all other systems.  PHYSICAL EXAM BP 119/62  Pulse 92  Ht 5\' 6"  (1.676 m)  Wt 199 lb 12.8 oz (90.629 kg)  BMI 32.25 kg/m2 GENERAL:  Well appearing HEENT:  Pupils equal round and reactive, fundi not visualized, oral mucosa unremarkable NECK:  No jugular venous distention, waveform within normal limits, carotid upstroke brisk and symmetric, no bruits, no thyromegaly LYMPHATICS:  No cervical, inguinal adenopathy LUNGS:  Clear to auscultation bilaterally BACK:  No CVA tenderness CHEST:  Well healed sternotomy scar. HEART:  PMI not displaced or sustained,S1 and S2 within normal limits, no S3, no S4, no clicks, no rubs, no murmurs ABD:  Flat, positive bowel sounds normal in frequency in pitch, no bruits,  no rebound, no guarding, no midline pulsatile mass, no hepatomegaly, no splenomegaly EXT:  2 plus pulses throughout, mild bilateral feet edema, no cyanosis no clubbing SKIN:  No rashes no nodules NEURO:  Cranial nerves II through XII grossly intact, motor grossly intact throughout PSYCH:  Cognitively intact, oriented to person place and time   ASSESSMENT AND PLAN  CAROTID ARTERY STENOSIS, BILATERAL -  She will have follow up in January.    CAD - She has severe small vessel disease as described. This only for medically. She will continue with risk reduction and medicines as listed.  HYPERLIPIDEMIA -  She has been intolerant of statins unfortunately and she will proceed with diet control.

## 2011-12-01 NOTE — Patient Instructions (Addendum)
The current medical regimen is effective;  continue present plan and medications.  Follow up with Dr Antoine Poche in January with a carotid doppler the same day.

## 2011-12-14 IMAGING — CR DG CHEST 2V
2 series · 2 of 2 positions shown · non-contrast
Comparison: 04/02/2010

CLINICAL DATA: Heart surgery March 2010.

CHEST - 2 VIEW

[w chest pa]
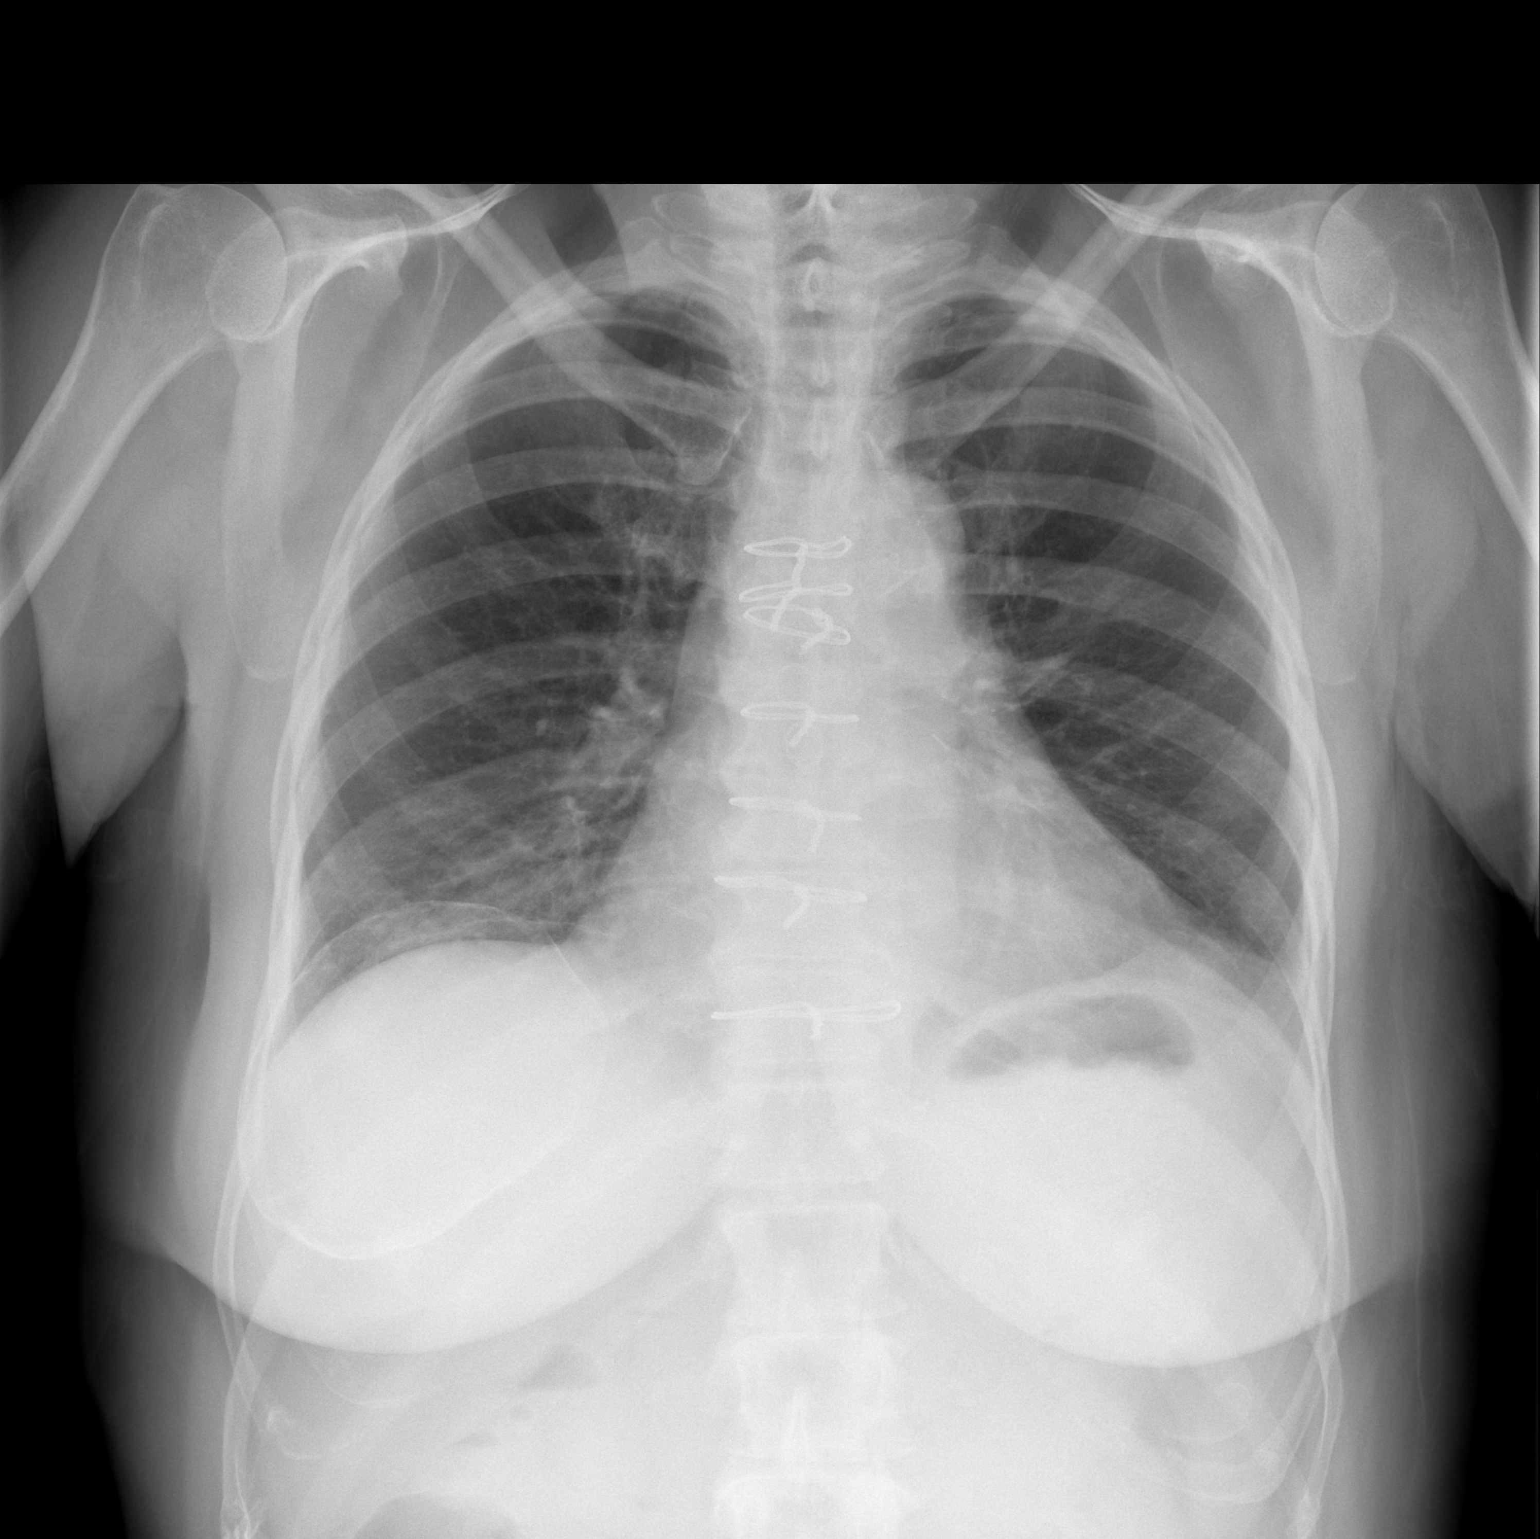

[w chest lat]
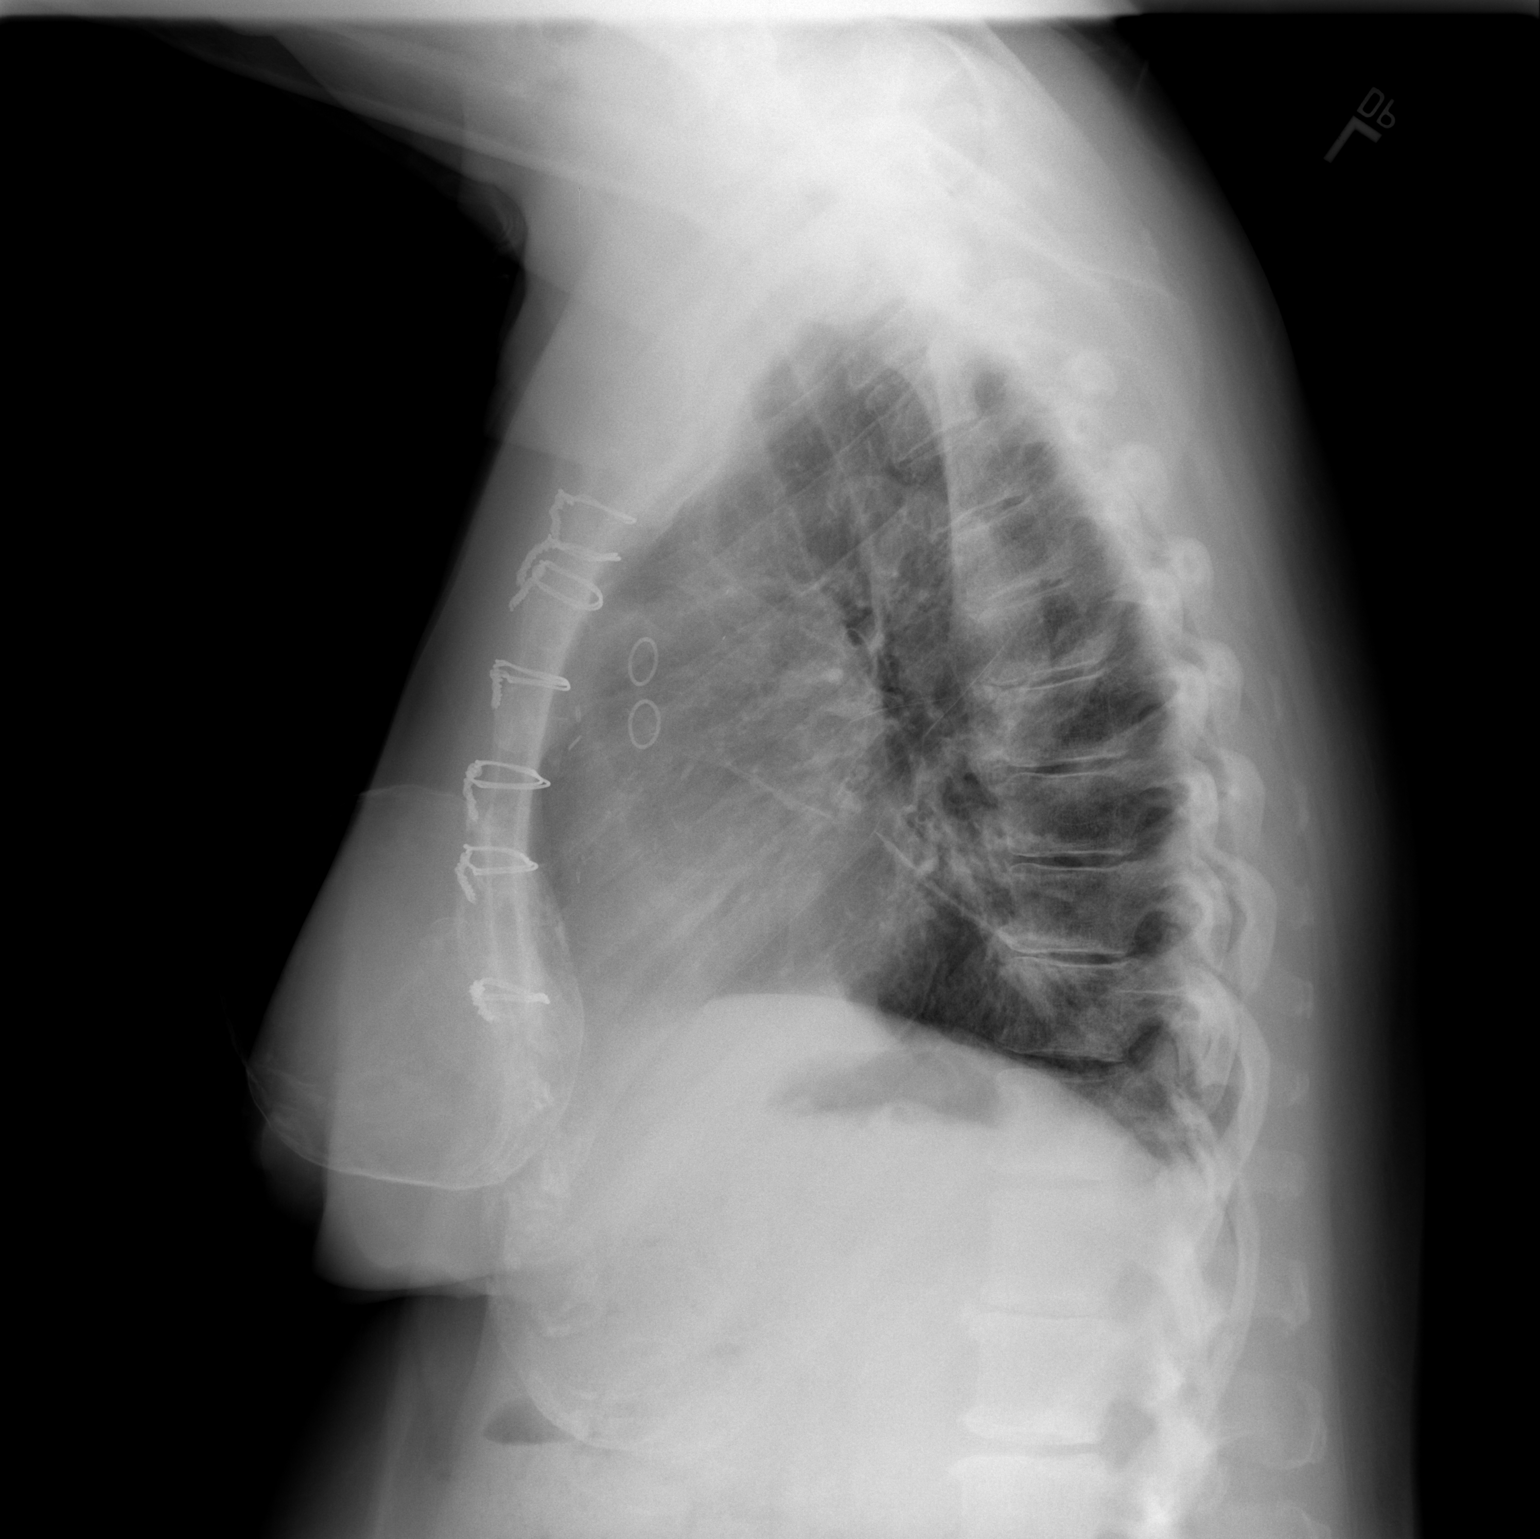

[2 of 2 positions shown; findings below may reference images not displayed]

FINDINGS: Trachea is midline.  Heart size stable.  Sternotomy wires
are stable in position from baseline postoperative exam of
03/31/2010.  Minimal bibasilar atelectasis.  No pleural fluid.
Right breast prosthesis is calcified.
IMPRESSION: Minimal bibasilar atelectasis.

## 2012-04-03 ENCOUNTER — Ambulatory Visit: Payer: Medicare HMO | Admitting: Cardiology

## 2012-04-17 ENCOUNTER — Encounter: Payer: Self-pay | Admitting: Cardiology

## 2012-04-17 ENCOUNTER — Ambulatory Visit (INDEPENDENT_AMBULATORY_CARE_PROVIDER_SITE_OTHER): Payer: Medicare HMO | Admitting: Cardiology

## 2012-04-17 ENCOUNTER — Encounter (INDEPENDENT_AMBULATORY_CARE_PROVIDER_SITE_OTHER): Payer: Medicare HMO

## 2012-04-17 VITALS — BP 145/82 | HR 75 | Ht 66.0 in | Wt 201.8 lb

## 2012-04-17 DIAGNOSIS — I6529 Occlusion and stenosis of unspecified carotid artery: Secondary | ICD-10-CM

## 2012-04-17 DIAGNOSIS — I10 Essential (primary) hypertension: Secondary | ICD-10-CM

## 2012-04-17 MED ORDER — FUROSEMIDE 20 MG PO TABS: 20.0000 mg | ORAL_TABLET | ORAL | Status: DC | PRN

## 2012-04-17 NOTE — Progress Notes (Signed)
HPI The patient presents for followup of coronary disease status post CABG.  She did have a followup catheterization last year demonstrating severe small vessel disease requiring medical management. Since I last saw her she has been on over 10 pounds of weight. She has generalized swelling. She is not describing new PND or orthopnea although she does continue to get dyspnea and some chest discomfort with activities. This is unchanged compared with her previous catheterization. She denies any palpitations, presyncope or syncope. She has taken herself off of some of her over-the-counter therapies. However, she still wondering if medications could be leading to her edema. She complains of continued neuropathy. 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  Allergies  Allergen Reactions  . Bee Venom Anaphylaxis  . Aspirin Hives  . Codeine Hives  . Cortisone Hives  . Demerol Hives  . Meperidine Hcl Hives  . Prednisone Hives    Current Outpatient Prescriptions  Medication Sig Dispense Refill  . acetaminophen (TYLENOL) 325 MG tablet Take 650 mg by mouth as needed. For pain      . amLODipine (NORVASC) 2.5 MG tablet Take 1 tablet (2.5 mg total) by mouth daily.  30 tablet  6  . clopidogrel (PLAVIX) 75 MG tablet Take 1 tablet (75 mg total) by mouth daily with breakfast.  30 tablet  6  . insulin glargine (LANTUS) 100 UNIT/ML injection Inject 12-20 Units into the skin at bedtime. Based on sugar levels      . isosorbide mononitrate (IMDUR) 30 MG 24 hr tablet Take 30 mg by mouth daily.      . nitroGLYCERIN (NITROSTAT) 0.4 MG SL tablet Place 1 tablet (0.4 mg total) under the tongue every 5 (five) minutes as needed for chest pain (up to 3 doses).  25 tablet  3  . Polyethyl Glycol-Propyl Glycol (SYSTANE OP) Place 1 drop into both eyes as needed. For dry eyes        Past Medical History  Diagnosis Date  . CAD (coronary artery disease)     S/P CABG January 2012; had poor target vessels for  grafting.  A cath  9/13, LAD 100%, diagonal occluded, diffuse second marginal 90% stenosis, first obtuse marginal occluded, nondominant right coronary occluded, saphenous vein graft to the diagonal occluded, saphenous vein graft to OM 40% stenosis, LIMA to the LAD patent   . S/P cardiac catheterization 03/2010    EF 50%   . Orthostatic hypotension     previous Midodrine therapy  . DM (diabetes mellitus)   . Obesity   . HLD (hyperlipidemia)     statin intolerant  . Aspirin allergy   . PVD (peripheral vascular disease)     prior balloon PCI to left leg in [place]  . Anginal pain     Past Surgical History  Procedure Date  . Coronary artery bypass graft 03/2010  . Cardiac catheterization 03/2010  . Carotid dopplers 03/2010    40-59% bilateral ICA stenosis  . Appendectomy   . Tonsillectomy   . Cholecystectomy 2012  . Other surgical history     cataract; bilateral  . Other surgical history     angioplasty left leg  . Eye surgery     laser  . Tubal ligation   . Angioplasty / stenting femoral 03/14/2011    ROS:  As stated in the HPI and negative for all other systems.  PHYSICAL EXAM BP 145/82  Pulse 75  Ht 5\' 6"  (1.676 m)  Wt 201 lb 12.8 oz (  91.536 kg)  BMI 32.57 kg/m2 GENERAL:  Well appearing HEENT:  Pupils equal round and reactive, fundi not visualized, oral mucosa unremarkable NECK:  No jugular venous distention, waveform within normal limits, carotid upstroke brisk and symmetric, no bruits, no thyromegaly LYMPHATICS:  No cervical, inguinal adenopathy LUNGS:  Clear to auscultation bilaterally BACK:  No CVA tenderness CHEST:  Well healed sternotomy scar. HEART:  PMI not displaced or sustained,S1 and S2 within normal limits, no S3, no S4, no clicks, no rubs, no murmurs ABD:  Flat, positive bowel sounds normal in frequency in pitch, no bruits, no rebound, no guarding, no midline pulsatile mass, no hepatomegaly, no splenomegaly EXT:  2 plus pulses throughout, mild diffuse  edema in hands and feet, no cyanosis no clubbing SKIN:  No rashes no nodules NEURO:  Cranial nerves II through XII grossly intact, motor grossly intact throughout PSYCH:  Cognitively intact, oriented to person place and time  EKG:  Sinus rhythm, rate 75, left axis deviation, poor anterior R wave progression, no acute ST-T wave changes. 04/17/2012  ASSESSMENT AND PLAN  EDEMA - Edema and weight gain are her most significant concerns. Apparently she's had a workup for rheumatologic diseases and no diagnosis has been forthcoming. I will defer further workup to [facility].  However, I'm not convinced that this is a cardiac etiology. I will go ahead and stop her for that. I will give her some when necessary diuretics. I will check an a.m. cortisol level as well.  CAROTID ARTERY STENOSIS, BILATERAL -  She had followup today but I do not yet have the results. We will follow this accordingly.    CAD - She has severe small vessel disease as described. This only for medically. She will continue with risk reduction and medicines as listed.  HYPERLIPIDEMIA -  She has been intolerant of statins unfortunately and she will proceed with diet control.  HTN - She will keep a watch on her blood pressure as she stops her Norvasc.

## 2012-04-17 NOTE — Patient Instructions (Addendum)
Please stop your Norvasc (amlodipine) You may take Furosemide as needed for swelling. Continue all other medications as listed.  Please have a cortisol level drawn as ordered.  Follow up with Dr Antoine Poche in 2 months.

## 2012-06-18 ENCOUNTER — Ambulatory Visit (INDEPENDENT_AMBULATORY_CARE_PROVIDER_SITE_OTHER): Payer: Medicare HMO | Admitting: Cardiology

## 2012-06-18 ENCOUNTER — Encounter: Payer: Self-pay | Admitting: Cardiology

## 2012-06-18 ENCOUNTER — Ambulatory Visit: Payer: Medicare HMO | Admitting: Cardiology

## 2012-06-18 VITALS — BP 128/82 | HR 86 | Ht 66.0 in | Wt 206.0 lb

## 2012-06-18 DIAGNOSIS — I6529 Occlusion and stenosis of unspecified carotid artery: Secondary | ICD-10-CM

## 2012-06-18 DIAGNOSIS — Z951 Presence of aortocoronary bypass graft: Secondary | ICD-10-CM

## 2012-06-18 DIAGNOSIS — I951 Orthostatic hypotension: Secondary | ICD-10-CM

## 2012-06-18 NOTE — Progress Notes (Signed)
HPI The patient presents for followup of coronary disease status post CABG.  Since I last saw her she said no new problems. She had been having increased swelling. I took her off of Norvasc but this does not seem to have changed the swelling. She's actually up a few more pounds since last visit. However, she's not really complaining of the edema this time. She certainly not having any shortness of breath, PND or orthopnea. She's not having any palpitations, presyncope or syncope. She's had no new chest pressure, neck or arm discomfort.  Allergies  Allergen Reactions  . Bee Venom Anaphylaxis  . Aspirin Hives  . Codeine Hives  . Cortisone Hives  . Demerol Hives  . Meperidine Hcl Hives  . Prednisone Hives    Current Outpatient Prescriptions  Medication Sig Dispense Refill  . acetaminophen (TYLENOL) 325 MG tablet Take 650 mg by mouth as needed. For pain      . clopidogrel (PLAVIX) 75 MG tablet Take 1 tablet (75 mg total) by mouth daily with breakfast.  30 tablet  6  . furosemide (LASIX) 20 MG tablet Take 1 tablet (20 mg total) by mouth as needed.  30 tablet  6  . insulin glargine (LANTUS) 100 UNIT/ML injection Inject 12-20 Units into the skin at bedtime. Based on sugar levels      . isosorbide mononitrate (IMDUR) 30 MG 24 hr tablet Take 30 mg by mouth daily.      . nitroGLYCERIN (NITROSTAT) 0.4 MG SL tablet Place 1 tablet (0.4 mg total) under the tongue every 5 (five) minutes as needed for chest pain (up to 3 doses).  25 tablet  3  . Polyethyl Glycol-Propyl Glycol (SYSTANE OP) Place 1 drop into both eyes as needed. For dry eyes       No current facility-administered medications for this visit.    Past Medical History  Diagnosis Date  . CAD (coronary artery disease)     S/P CABG January 2012; had poor target vessels for grafting.  A cath  9/13, LAD 100%, diagonal occluded, diffuse second marginal 90% stenosis, first obtuse marginal occluded, nondominant right coronary occluded, saphenous  vein graft to the diagonal occluded, saphenous vein graft to OM 40% stenosis, LIMA to the LAD patent   . S/P cardiac catheterization 03/2010    EF 50%   . Orthostatic hypotension     previous Midodrine therapy  . DM (diabetes mellitus)   . Obesity   . HLD (hyperlipidemia)     statin intolerant  . Aspirin allergy   . PVD (peripheral vascular disease)     prior balloon PCI to left leg in [place]  . Anginal pain     Past Surgical History  Procedure Laterality Date  . Coronary artery bypass graft  03/2010  . Cardiac catheterization  03/2010  . Carotid dopplers  03/2010    40-59% bilateral ICA stenosis  . Appendectomy    . Tonsillectomy    . Cholecystectomy  2012  . Other surgical history      cataract; bilateral  . Other surgical history      angioplasty left leg  . Eye surgery      laser  . Tubal ligation    . Angioplasty / stenting femoral  03/14/2011    ROS:  As stated in the HPI and negative for all other systems.  PHYSICAL EXAM BP 128/82  Pulse 86  Ht 5\' 6"  (1.676 m)  Wt 206 lb (93.441 kg)  BMI 33.27 kg/m2  GENERAL:  Well appearing HEENT:  Pupils equal round and reactive, fundi not visualized, oral mucosa unremarkable NECK:  No jugular venous distention, waveform within normal limits, carotid upstroke brisk and symmetric, no bruits, no thyromegaly LYMPHATICS:  No cervical, inguinal adenopathy LUNGS:  Clear to auscultation bilaterally BACK:  No CVA tenderness CHEST:  Well healed sternotomy scar. HEART:  PMI not displaced or sustained,S1 and S2 within normal limits, no S3, no S4, no clicks, no rubs, no murmurs ABD:  Flat, positive bowel sounds normal in frequency in pitch, no bruits, no rebound, no guarding, no midline pulsatile mass, no hepatomegaly, no splenomegaly EXT:  2 plus pulses throughout, mild diffuse edema in hands and feet, no cyanosis no clubbing I  ASSESSMENT AND PLAN  EDEMA - This seems to be stable. It actually wasn't better off the Norvasc but it  looks like she's not requiring this medication from a blood pressure standpoint. No further workup is suggested.  CAROTID ARTERY STENOSIS, BILATERAL -  She has 60-79% bilateral stenosis in February and she will have followup again in August.  CAD - She has severe small vessel disease.  This will continue to be managed medically. I think the key is good diabetes control.  HYPERLIPIDEMIA -  She has been intolerant of statins unfortunately and she will proceed with diet control.  HTN - Her blood pressure has been controlled off of Norvasc. No change in therapy is indicated.

## 2012-06-18 NOTE — Patient Instructions (Addendum)
The current medical regimen is effective;  continue present plan and medications.  Follow up in 1 year with Dr Hochrein.  You will receive a letter in the mail 2 months before you are due.  Please call us when you receive this letter to schedule your follow up appointment.  

## 2012-10-22 ENCOUNTER — Encounter (INDEPENDENT_AMBULATORY_CARE_PROVIDER_SITE_OTHER): Payer: Medicare HMO

## 2012-10-22 DIAGNOSIS — I6529 Occlusion and stenosis of unspecified carotid artery: Secondary | ICD-10-CM

## 2012-10-31 ENCOUNTER — Telehealth: Payer: Self-pay | Admitting: Cardiology

## 2012-10-31 NOTE — Telephone Encounter (Signed)
PT AWARE OF CAROTID RESULTS ./CY 

## 2012-10-31 NOTE — Telephone Encounter (Signed)
Pt tn call to christine, pls call [phone]

## 2013-03-14 HISTORY — PX: VITRECTOMY: SHX106

## 2013-04-19 ENCOUNTER — Encounter: Payer: Self-pay | Admitting: Nurse Practitioner

## 2013-04-19 ENCOUNTER — Ambulatory Visit (INDEPENDENT_AMBULATORY_CARE_PROVIDER_SITE_OTHER): Payer: Medicare HMO | Admitting: Nurse Practitioner

## 2013-04-19 VITALS — BP 170/90 | HR 81 | Ht 66.0 in | Wt 212.8 lb

## 2013-04-19 DIAGNOSIS — I2510 Atherosclerotic heart disease of native coronary artery without angina pectoris: Secondary | ICD-10-CM

## 2013-04-19 MED ORDER — METOPROLOL SUCCINATE ER 50 MG PO TB24: 50.0000 mg | ORAL_TABLET | Freq: Every day | ORAL | Status: DC

## 2013-04-19 MED ORDER — ISOSORBIDE MONONITRATE ER 30 MG PO TB24: 30.0000 mg | ORAL_TABLET | Freq: Every day | ORAL | Status: DC

## 2013-04-19 MED ORDER — CLOPIDOGREL BISULFATE 75 MG PO TABS: 75.0000 mg | ORAL_TABLET | Freq: Every day | ORAL | Status: DC

## 2013-04-19 NOTE — Patient Instructions (Signed)
We are adding back your Plavix 75 mg a day  We are adding back Imdur 30 mg a day  We are adding Toprol 50 mg to take each night at bedtime  We will update your carotid duplex  I need to see you in about 7 to 10 days   Call the [facility] office at [phone] if you have any questions, problems or concerns.

## 2013-04-19 NOTE — Progress Notes (Signed)
[name] Date of Birth: 04-Aug-1945 Medical Record #[id number]  History of Present Illness: [name] is seen today for a work in visit. She is seen for Dr. Antoine Poche. She has known CAD with prior CABG in January of 2012 per Dr. Cornelius Moras. She had CABG x 3 and had poor target vessels for grafting due to distal disease. Her other problems include IDDM, HLD with statin intolerance and aspirin allergy. She also has PVD with bilateral carotid disesae - 60 to 79% on the right and 40 to 59% on the left - last studied in August of 2014 and 6 month follow up recommended. She was last cathed in September of 2013 - medical management recommended - has 2/3 grafts patent.   Last seen here in April of 2014 by Dr. Antoine Poche - was felt to be doing well.   Comes in today at the request of her PCP - thought she should be seen sooner. She is here alone. Tells me that her life has been pretty complicated over the pat 6 months. Her husband was in the hospital at [facility] for 5 1/2 months and died 2 months ago. His children are being "quite cruel" to her.  Notes that she has been having chest pain on a daily basis - described as a pressure sensation - no NTG taken. No energy. Sleeping too much. Had a spell over a month ago where she had profuse diaphoresis and then slept for 26 hours - refused to go the ER. Has been waking up about 2 to 3 am every morning with her heart pounding. Has stopped all of her cardiac medicines - not clear to me as to why. She does not remember what the side effects were.    Current Outpatient Prescriptions  Medication Sig Dispense Refill  . acetaminophen (TYLENOL) 325 MG tablet Take 650 mg by mouth as needed. For pain      . furosemide (LASIX) 20 MG tablet Take 1 tablet (20 mg total) by mouth as needed.  30 tablet  6  . glimepiride (AMARYL) 1 MG tablet Take 1 mg by mouth daily with breakfast.      . insulin glargine (LANTUS) 100 UNIT/ML injection Inject 30 Units into the skin at bedtime.  Based on sugar levels      . nitroGLYCERIN (NITROSTAT) 0.4 MG SL tablet Place 1 tablet (0.4 mg total) under the tongue every 5 (five) minutes as needed for chest pain (up to 3 doses).  25 tablet  3  . omega-3 acid ethyl esters (LOVAZA) 1 G capsule Take 300 mg by mouth 2 (two) times daily.      Bertram Gala Glycol-Propyl Glycol (SYSTANE OP) Place 1 drop into both eyes as needed. For dry eyes      . Red Yeast Rice 600 MG CAPS Take 600 mg by mouth 2 (two) times daily.       No current facility-administered medications for this visit.    Allergies  Allergen Reactions  . Bee Venom Anaphylaxis  . Aspirin Hives  . Codeine Hives  . Cortisone Hives  . Demerol Hives  . Meperidine Hcl Hives  . Prednisone Hives    Past Medical History  Diagnosis Date  . CAD (coronary artery disease)     S/P CABG January 2012; had poor target vessels for grafting.  A cath  9/13, LAD 100%, diagonal occluded, diffuse second marginal 90% stenosis, first obtuse marginal occluded, nondominant right coronary occluded, saphenous vein graft to the diagonal occluded, saphenous  vein graft to OM 40% stenosis, LIMA to the LAD patent   . S/P cardiac catheterization 03/2010    EF 50%   . Orthostatic hypotension     previous Midodrine therapy  . DM (diabetes mellitus)   . Obesity   . HLD (hyperlipidemia)     statin intolerant  . Aspirin allergy   . PVD (peripheral vascular disease)     [facility] [place] [facility]    Past Surgical History  Procedure Laterality Date  . Coronary artery bypass graft  03/2010  . Cardiac catheterization  03/2010  . Carotid dopplers  03/2010    40-59% bilateral ICA stenosis  . Appendectomy    . Tonsillectomy    . Cholecystectomy  2012  . Other surgical history      cataract; bilateral  . Other surgical history      angioplasty left leg  . Eye surgery      laser  . Tubal ligation    . Angioplasty / stenting femoral  03/14/2011    History  Smoking status  . Never  Smoker   Smokeless tobacco  . Never Used    History  Alcohol Use No    Family History  Problem Relation Age of Onset  . Esophageal cancer Mother   . Heart attack Father     MI    Review of Systems: The review of systems is per the HPI.  All other systems were reviewed and are negative.  Physical Exam: BP 170/90  Pulse 81  Ht 5\' 6"  (1.676 m)  Wt 212 lb 12.8 oz (96.525 kg)  BMI 34.36 kg/m2 Patient is very pleasant and in no acute distress. She is obese. Weight is up. Skin is warm and dry. Color is normal.  HEENT is unremarkable but has broken teeth. Normocephalic/atraumatic. PERRL. Sclera are nonicteric. Neck is supple. No masses. No JVD. Lungs are clear. Cardiac exam shows a regular rate and rhythm. Abdomen is soft. Extremities are without edema. Gait and ROM are intact. No gross neurologic deficits noted.  Wt Readings from Last 3 Encounters:  04/19/13 212 lb 12.8 oz (96.525 kg)  06/18/12 206 lb (93.441 kg)  04/17/12 201 lb 12.8 oz (91.536 kg)     LABORATORY DATA: EKG today shows sinus rhythm with nonspecific changes.   Lab Results  Component Value Date   WBC 5.8 11/19/2011   HGB 11.8* 11/19/2011   HCT 35.0* 11/19/2011   PLT 157 11/19/2011   GLUCOSE 131* 11/21/2011   CHOL 289* 11/20/2011   TRIG 201* 11/20/2011   HDL 51 11/20/2011   LDLDIRECT 182* 11/20/2011   LDLCALC 198* 11/20/2011   ALT 13 10/02/2011   AST 18 10/02/2011   NA 139 11/21/2011   K 4.0 11/21/2011   CL 106 11/21/2011   CREATININE 0.76 11/21/2011   BUN 19 11/21/2011   CO2 20 11/21/2011   TSH 1.370 11/15/2011   INR 1.02 11/20/2011   HGBA1C 7.2* 10/03/2011   Angiographic Findings from September 2013  Left main: Distal 30% stenosis.  Left Anterior Descending Artery: Moderate sized vessel with diffuse disease extending from the ostium down to the apex. The proximal vessel has diffuse 70% stenosis. The mid vessel has 100% occlusion. The diagonal branch is small in caliber, diffusely diseased and fills antegrade.  Circumflex Artery:  Moderate sized vessel with 50% ostial stenosis. The first obtuse marginal branch is occluded at the ostium and fills from the vein graft. The mid vessel has a 70-80%  stenosis at the takeoff of the small caliber second marginal branch. The small caliber second marginal branch has diffuse 90% narrowing proximally. The severe stenosis in the mid vessel appears unchanged from last angiogram.  Right Coronary Artery: Small, non-dominant vessel with diffuse disease in the proximal vessel and 100% total occlusion in the mid vessel.  Graft Anatomy:  SVG to diagonal branch is occluded  SVG to first OM branch is patent with 40% stenosis in mid body of vein graft. The target vessel is small with diffuse disease.  LIMA to mid LAD is patent. The distal LAD is diffusely diseased beyond the graft insertion.  Left Ventricular Angiogram: LVEF 45-50%  Impression:  1. Severe triple vessel CAD s/p 3V CABG with 2/3 patent bypass grafts.  2. 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  3. Mild LV systolic dysfunction.  Recommendations: She does not have good targets for PCI. The mid Circumflex stenosis could be approached with PCI, however, this stenosis is unchanged from cath 2 years ago and is most likely not the reason for her recent change in symptoms. She has been off of all cardiac meds since her cholecystectomy in August 2012. Will resume Plavix as she is intolerant to ASA. Will resume beta blocker. Will continue Norvasc. She has not tolerated statins in the past. Will not start statin. She may need another trial of long acting nitrate. She tried it over the weekend and had a bad headache.  Complications: None; patient tolerated the procedure well.   Assessment / Plan:  1. CAD - prior CABG with poor targets - small vessel disease - managed medically - adding back her Plavix and Imdur - will also start Toprol XL 50 mg. Will see her back in about 7 to 10 days - may need to refer on for repeat cardiac cath.  Unfortunately I think her overall prognosis is quite poor with very limited options given her coronary anatomy.   2. HTN - adding beta blocker  3. HLD - statin intolerant unfortunately - on Red Yeast Rice and complaining of muscle soreness with this.   4. Carotid disease - needs her duplex rechecked.   Patient is agreeable to this plan and will call if any problems develop in the interim.   Rosalio Macadamia, RN, ANP-C [facility] [address] [place], [place]  [address] [phone]

## 2013-04-26 ENCOUNTER — Ambulatory Visit: Payer: Medicare HMO | Admitting: Nurse Practitioner

## 2013-04-29 ENCOUNTER — Ambulatory Visit: Payer: Medicare HMO | Admitting: Nurse Practitioner

## 2013-04-29 ENCOUNTER — Telehealth: Payer: Self-pay | Admitting: *Deleted

## 2013-04-29 ENCOUNTER — Other Ambulatory Visit (HOSPITAL_COMMUNITY): Payer: Self-pay | Admitting: Cardiology

## 2013-04-29 DIAGNOSIS — I6529 Occlusion and stenosis of unspecified carotid artery: Secondary | ICD-10-CM

## 2013-04-29 NOTE — Telephone Encounter (Signed)
S/w pt is aware of inclement weather our office closing moved appt.

## 2013-05-06 ENCOUNTER — Ambulatory Visit (INDEPENDENT_AMBULATORY_CARE_PROVIDER_SITE_OTHER): Payer: Medicare HMO | Admitting: Nurse Practitioner

## 2013-05-06 ENCOUNTER — Ambulatory Visit: Payer: Medicare HMO | Admitting: Nurse Practitioner

## 2013-05-06 ENCOUNTER — Encounter: Payer: Self-pay | Admitting: Nurse Practitioner

## 2013-05-06 VITALS — BP 160/90 | HR 80 | Ht 66.0 in | Wt 211.0 lb

## 2013-05-06 DIAGNOSIS — R079 Chest pain, unspecified: Secondary | ICD-10-CM

## 2013-05-06 DIAGNOSIS — I2510 Atherosclerotic heart disease of native coronary artery without angina pectoris: Secondary | ICD-10-CM

## 2013-05-06 MED ORDER — AMLODIPINE BESYLATE 5 MG PO TABS: 5.0000 mg | ORAL_TABLET | Freq: Every day | ORAL | Status: DC

## 2013-05-06 MED ORDER — FUROSEMIDE 20 MG PO TABS: 20.0000 mg | ORAL_TABLET | ORAL | Status: DC | PRN

## 2013-05-06 NOTE — Progress Notes (Signed)
[name] Date of Birth: 1946/01/30 Medical Record #[id number]  History of Present Illness: [name] is seen today for a follow up visit. She is seen for Dr. Antoine Poche. She has known CAD with prior CABG in January of 2012 per Dr. Cornelius Moras. She had CABG x 3 and had poor target vessels for grafting due to distal disease. Her other problems include IDDM, HLD with statin intolerance and aspirin allergy. She also has PVD with bilateral carotid disease - 60 to 79% on the right and 40 to 59% on the left - last studied in August of 2014 and 6 month follow up recommended. She was last cathed in September of 2013 - medical management recommended - has 2/3 grafts patent.   Last seen here in April of 2014 by Dr. Antoine Poche - was felt to be doing well.   Seen earlier this month and was not doing well. Her husband died after being in the hospital at [facility] for 5 1/2 months and died 2 1/2 months ago. She was having chest pain and palpitations. Had stopped all of her cardiac medicines - not clear to me as to why. She did not remember what the side effects were. I put her back on her Plavix, added back Imdur, added Toprol and updated her carotid duplex.   Comes back today. Here alone. Feels about the same. Still with chest pain. Remains fatigued. Stopped the Imdur 3 days ago due to headaches. Has stopped her Red Yeast Rice due to myalgias. BP still up.   Current Outpatient Prescriptions  Medication Sig Dispense Refill  . acetaminophen (TYLENOL) 325 MG tablet Take 650 mg by mouth as needed. For pain      . clopidogrel (PLAVIX) 75 MG tablet Take 1 tablet (75 mg total) by mouth daily.  90 tablet  3  . furosemide (LASIX) 20 MG tablet Take 1 tablet (20 mg total) by mouth as needed.  30 tablet  6  . glimepiride (AMARYL) 1 MG tablet Take 1 mg by mouth daily with breakfast.      . insulin glargine (LANTUS) 100 UNIT/ML injection Inject 30 Units into the skin at bedtime. Based on sugar levels      . metoprolol  succinate (TOPROL-XL) 50 MG 24 hr tablet Take 1 tablet (50 mg total) by mouth daily. Take with or immediately following a meal.  90 tablet  3  . omega-3 acid ethyl esters (LOVAZA) 1 G capsule Take 600 mg by mouth daily.       Bertram Gala Glycol-Propyl Glycol (SYSTANE OP) Place 1 drop into both eyes as needed. For dry eyes      . isosorbide mononitrate (IMDUR) 30 MG 24 hr tablet Take 1 tablet (30 mg total) by mouth daily.  90 tablet  3  . nitroGLYCERIN (NITROSTAT) 0.4 MG SL tablet Place 1 tablet (0.4 mg total) under the tongue every 5 (five) minutes as needed for chest pain (up to 3 doses).  25 tablet  3   No current facility-administered medications for this visit.    Allergies  Allergen Reactions  . Bee Venom Anaphylaxis  . Aspirin Hives  . Codeine Hives  . Cortisone Hives  . Demerol Hives  . Meperidine Hcl Hives  . Prednisone Hives    Past Medical History  Diagnosis Date  . CAD (coronary artery disease)     S/P CABG January 2012; had poor target vessels for grafting.  A cath  9/13, LAD 100%, diagonal occluded, diffuse second marginal 90%  stenosis, first obtuse marginal occluded, nondominant right coronary occluded, saphenous vein graft to the diagonal occluded, saphenous vein graft to OM 40% stenosis, LIMA to the LAD patent   . S/P cardiac catheterization 03/2010    EF 50%   . Orthostatic hypotension     previous Midodrine therapy  . DM (diabetes mellitus)   . Obesity   . HLD (hyperlipidemia)     statin intolerant  . Aspirin allergy   . PVD (peripheral vascular disease)     prior balloon PCI to left leg in [facility]    Past Surgical History  Procedure Laterality Date  . Coronary artery bypass graft  03/2010  . Cardiac catheterization  03/2010  . Carotid dopplers  03/2010    40-59% bilateral ICA stenosis  . Appendectomy    . Tonsillectomy    . Cholecystectomy  2012  . Other surgical history      cataract; bilateral  . Other surgical history      angioplasty left leg  .  Eye surgery      laser  . Tubal ligation    . Angioplasty / stenting femoral  03/14/2011    History  Smoking status  . Never Smoker   Smokeless tobacco  . Never Used    History  Alcohol Use No    Family History  Problem Relation Age of Onset  . Esophageal cancer Mother   . Heart attack Father     MI    Review of Systems: The review of systems is per the HPI.  All other systems were reviewed and are negative.  Physical Exam: BP 160/90  Pulse 80  Ht 5\' 6"  (1.676 m)  Wt 211 lb (95.709 kg)  BMI 34.07 kg/m2  SpO2 96% Patient is very pleasant and in no acute distress. Skin is warm and dry. Color is normal.  HEENT is unremarkable. Normocephalic/atraumatic. PERRL. Sclera are nonicteric. Neck is supple. No masses. No JVD. Lungs are clear. Cardiac exam shows a regular rate and rhythm. Abdomen is soft. Extremities are without edema. Gait and ROM are intact. No gross neurologic deficits noted.  LABORATORY DATA: Lab Results  Component Value Date   WBC 5.8 11/19/2011   HGB 11.8* 11/19/2011   HCT 35.0* 11/19/2011   PLT 157 11/19/2011   GLUCOSE 131* 11/21/2011   CHOL 289* 11/20/2011   TRIG 201* 11/20/2011   HDL 51 11/20/2011   LDLDIRECT 182* 11/20/2011   LDLCALC 198* 11/20/2011   ALT 13 10/02/2011   AST 18 10/02/2011   NA 139 11/21/2011   K 4.0 11/21/2011   CL 106 11/21/2011   CREATININE 0.76 11/21/2011   BUN 19 11/21/2011   CO2 20 11/21/2011   TSH 1.370 11/15/2011   INR 1.02 11/20/2011   HGBA1C 7.2* 10/03/2011   Angiographic Findings from September 2013  Left main: Distal 30% stenosis.  Left Anterior Descending Artery: Moderate sized vessel with diffuse disease extending from the ostium down to the apex. The proximal vessel has diffuse 70% stenosis. The mid vessel has 100% occlusion. The diagonal branch is small in caliber, diffusely diseased and fills antegrade.  Circumflex Artery: Moderate sized vessel with 50% ostial stenosis. The first obtuse marginal branch is occluded at the ostium and fills from the  vein graft. The mid vessel has a 70-80% stenosis at the takeoff of the small caliber second marginal branch. The small caliber second marginal branch has diffuse 90% narrowing proximally. The severe stenosis in the mid vessel appears unchanged from last angiogram.  Right Coronary Artery: Small, non-dominant vessel with diffuse disease in the proximal vessel and 100% total occlusion in the mid vessel.  Graft Anatomy:  SVG to diagonal branch is occluded  SVG to first OM branch is patent with 40% stenosis in mid body of vein graft. The target vessel is small with diffuse disease.  LIMA to mid LAD is patent. The distal LAD is diffusely diseased beyond the graft insertion.  Left Ventricular Angiogram: LVEF 45-50%  Impression:  1. Severe triple vessel CAD s/p 3V CABG with 2/3 patent bypass grafts.  2. 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  3. Mild LV systolic dysfunction.  Recommendations: She does not have good targets for PCI. The mid Circumflex stenosis could be approached with PCI, however, this stenosis is unchanged from cath 2 years ago and is most likely not the reason for her recent change in symptoms. She has been off of all cardiac meds since her cholecystectomy in August 2012. Will resume Plavix as she is intolerant to ASA. Will resume beta blocker. Will continue Norvasc. She has not tolerated statins in the past. Will not start statin. She may need another trial of long acting nitrate. She tried it over the weekend and had a bad headache.  Complications: None; patient tolerated the procedure well.     Assessment / Plan:  1. CAD - prior CABG with poor targets - small vessel disease - managed medically - I have added back her Plavix and Imdur but she has not tolerated the Imdur due to headaches - also started Toprol XL 50 mg which she seems to be tolerating. She remains quite symptomatic - I am adding back Norvasc 5 mg (only gave her 10 pills in case she does not tolerate) - she was on  this in the past and again does not remember why she stopped. Unfortunately I still think her overall prognosis is quite poor with very limited options given her coronary anatomy. ?consider Ranexa.   2. HTN - BP still up - I am adding Norvasc today.   3. HLD - statin intolerant unfortunately - and now off of her Red Yeast Rice due to complaining of muscle soreness with this.   4. Carotid disease - for repeat study next week  Will try the Norvasc. Get her back to see me and Dr. Antoine PocheHochrein in about 10 days.   Patient is agreeable to this plan and will call if any problems develop in the interim.   Rosalio MacadamiaLori C. Calie Buttrey, RN, ANP-C  [facility]  [address]  [place], [place] [id number]  [phone]

## 2013-05-06 NOTE — Patient Instructions (Addendum)
Stay on your current medicines but I am stopping the Imdur due to side effects.  I am adding Norvasc 5 mg daily - I sent in a RX for just 10 pills  See me and Dr. Antoine Poche in 10 days  Call the [facility] office at [phone] if you have any questions, problems or concerns.

## 2013-05-08 ENCOUNTER — Encounter (HOSPITAL_COMMUNITY): Payer: Medicare HMO

## 2013-05-10 ENCOUNTER — Other Ambulatory Visit: Payer: Self-pay | Admitting: *Deleted

## 2013-05-10 ENCOUNTER — Telehealth: Payer: Self-pay | Admitting: Cardiology

## 2013-05-10 MED ORDER — AMLODIPINE BESYLATE 5 MG PO TABS: 5.0000 mg | ORAL_TABLET | Freq: Two times a day (BID) | ORAL | Status: DC

## 2013-05-10 NOTE — Telephone Encounter (Signed)
S/w pt was concerned about bp was 199/110 yesterday, 204/110 @ 4:00 am, 160/95 around 10:00 am, pt is taking medication right now, I stated to call back in an hour after medication and another bp check, pt stated verbal understanding and is taking all medcication

## 2013-05-10 NOTE — Telephone Encounter (Signed)
New message    Calling patient to reminded pt of appt for Monday.    Patient stated she waking up at 4 am with high blood pressure  204/110. In left arm  Was today reading.    Offer an appt to come in today due to opening pt wanted to wait until Monday .

## 2013-05-10 NOTE — Telephone Encounter (Signed)
S/w pt after pt has taken medication left arm, 157/86, right arm 164/93 pt feels ok on Norvasc, t/w Lori increased Norvasc (5mg ) am and (2.5 mg) pm pt agreeable to plan

## 2013-05-13 ENCOUNTER — Encounter: Payer: Self-pay | Admitting: Nurse Practitioner

## 2013-05-13 ENCOUNTER — Ambulatory Visit (INDEPENDENT_AMBULATORY_CARE_PROVIDER_SITE_OTHER): Payer: Medicare HMO | Admitting: Nurse Practitioner

## 2013-05-13 ENCOUNTER — Ambulatory Visit (HOSPITAL_COMMUNITY): Payer: Medicare HMO | Attending: Cardiology

## 2013-05-13 VITALS — BP 140/72 | HR 77 | Ht 66.0 in | Wt 210.0 lb

## 2013-05-13 DIAGNOSIS — I2510 Atherosclerotic heart disease of native coronary artery without angina pectoris: Secondary | ICD-10-CM

## 2013-05-13 DIAGNOSIS — I10 Essential (primary) hypertension: Secondary | ICD-10-CM

## 2013-05-13 DIAGNOSIS — R079 Chest pain, unspecified: Secondary | ICD-10-CM

## 2013-05-13 DIAGNOSIS — I6529 Occlusion and stenosis of unspecified carotid artery: Secondary | ICD-10-CM

## 2013-05-13 MED ORDER — AMLODIPINE BESYLATE 5 MG PO TABS: ORAL_TABLET | ORAL | Status: DC

## 2013-05-13 NOTE — Progress Notes (Signed)
[name] Date of Birth: 1945-06-10 Medical Record #[id number]  History of Present Illness: Ms. Frandsen is seen back today for a follow up visit. Seen for Dr. Antoine Poche. She has known CAD with prior CABG in January of 2012 per Dr. Cornelius Moras. She had CABG x 3 and had poor target vessels for grafting due to distal disease. Her other problems include IDDM, HLD with statin intolerance and aspirin allergy. She also has PVD with bilateral carotid disease - 60 to 79% on the right and 40 to 59% on the left - last studied in August of 2014 and 6 month follow up recommended. She was last cathed in September of 2013 - medical management recommended - has 2/3 grafts patent.   Last seen here in April of 2014 by Dr. Antoine Poche - was felt to be doing well.   Seen earlier in February and was not doing well. Her husband died after being in the hospital at [facility] for 5 1/2 months and died 2 1/2 months ago. She was having chest pain and palpitations. Had stopped all of her cardiac medicines - not clear to me as to why. She did not remember what the side effects were. I put her back on her Plavix, added back Imdur, added Toprol and updated her carotid duplex.   Seen about 10 days ago - she stopped the Imdur and stopped her Red Yeast Rice. BP still up. Started Norvasc. She has tolerated the Norvasc and we have already increased the dose due to elevated BP.   Comes back today. Here alone. She is doing so much better. Feels so much better. BP trending down. Less chest pain. Notes that where she would have chest pain every half hour now it is maybe once or twice a day. Has more energy. She is back on her Imdur - says her head has stopped huring  Current Outpatient Prescriptions  Medication Sig Dispense Refill  . acetaminophen (TYLENOL) 325 MG tablet Take 650 mg by mouth as needed. For pain      . amLODipine (NORVASC) 5 MG tablet Take 1 tablet (5 mg total) by mouth 2 (two) times daily. Take ( 5 mg ) in the am (2.5 mg ) in  the pm  10 tablet  3  . clopidogrel (PLAVIX) 75 MG tablet Take 1 tablet (75 mg total) by mouth daily.  90 tablet  3  . furosemide (LASIX) 20 MG tablet Take 1 tablet (20 mg total) by mouth as needed.  30 tablet  6  . glimepiride (AMARYL) 1 MG tablet Take 1 mg by mouth daily with breakfast.      . insulin glargine (LANTUS) 100 UNIT/ML injection Inject 30 Units into the skin at bedtime. Based on sugar levels      . isosorbide mononitrate (IMDUR) 30 MG 24 hr tablet Take 30 mg by mouth daily.       . metoprolol succinate (TOPROL-XL) 50 MG 24 hr tablet Take 1 tablet (50 mg total) by mouth daily. Take with or immediately following a meal.  90 tablet  3  . nitroGLYCERIN (NITROSTAT) 0.4 MG SL tablet Place 1 tablet (0.4 mg total) under the tongue every 5 (five) minutes as needed for chest pain (up to 3 doses).  25 tablet  3  . omega-3 acid ethyl esters (LOVAZA) 1 G capsule Take 600 mg by mouth daily.       Bertram Gala Glycol-Propyl Glycol (SYSTANE OP) Place 1 drop into both eyes as needed. For dry  eyes       No current facility-administered medications for this visit.    Allergies  Allergen Reactions  . Bee Venom Anaphylaxis  . Aspirin Hives  . Codeine Hives  . Cortisone Hives  . Demerol Hives  . Meperidine Hcl Hives  . Prednisone Hives    Past Medical History  Diagnosis Date  . CAD (coronary artery disease)     S/P CABG January 2012; had poor target vessels for grafting.  A cath  9/13, LAD 100%, diagonal occluded, diffuse second marginal 90% stenosis, first obtuse marginal occluded, nondominant right coronary occluded, saphenous vein graft to the diagonal occluded, saphenous vein graft to OM 40% stenosis, LIMA to the LAD patent   . S/P cardiac catheterization 03/2010    EF 50%   . Orthostatic hypotension     previous Midodrine therapy  . DM (diabetes mellitus)   . Obesity   . HLD (hyperlipidemia)     statin intolerant  . Aspirin allergy   . PVD (peripheral vascular disease)     prior  balloon PCI to left leg in [facility]    Past Surgical History  Procedure Laterality Date  . Coronary artery bypass graft  03/2010  . Cardiac catheterization  03/2010  . Carotid dopplers  03/2010    40-59% bilateral ICA stenosis  . Appendectomy    . Tonsillectomy    . Cholecystectomy  2012  . Other surgical history      cataract; bilateral  . Other surgical history      angioplasty left leg  . Eye surgery      laser  . Tubal ligation    . Angioplasty / stenting femoral  03/14/2011    History  Smoking status  . Never Smoker   Smokeless tobacco  . Never Used    History  Alcohol Use No    Family History  Problem Relation Age of Onset  . Esophageal cancer Mother   . Heart attack Father     MI    Review of Systems: The review of systems is per the HPI.  All other systems were reviewed and are negative.  Physical Exam: BP 140/72  Pulse 77  Ht 5\' 6"  (1.676 m)  Wt 210 lb (95.255 kg)  BMI 33.91 kg/m2  SpO2 97% Patient is very pleasant and in no acute distress. Skin is warm and dry. Color is normal.  HEENT is unremarkable. Normocephalic/atraumatic. PERRL. Sclera are nonicteric. Neck is supple. No masses. No JVD. Lungs are clear. Cardiac exam shows a regular rate and rhythm. Abdomen is soft. Extremities are without edema. Gait and ROM are intact. No gross neurologic deficits noted.  LABORATORY DATA:  Lab Results  Component Value Date   WBC 5.8 11/19/2011   HGB 11.8* 11/19/2011   HCT 35.0* 11/19/2011   PLT 157 11/19/2011   GLUCOSE 131* 11/21/2011   CHOL 289* 11/20/2011   TRIG 201* 11/20/2011   HDL 51 11/20/2011   LDLDIRECT 182* 11/20/2011   LDLCALC 198* 11/20/2011   ALT 13 10/02/2011   AST 18 10/02/2011   NA 139 11/21/2011   K 4.0 11/21/2011   CL 106 11/21/2011   CREATININE 0.76 11/21/2011   BUN 19 11/21/2011   CO2 20 11/21/2011   TSH 1.370 11/15/2011   INR 1.02 11/20/2011   HGBA1C 7.2* 10/03/2011   Angiographic Findings from September 2013  Left main: Distal 30% stenosis.  Left Anterior  Descending Artery: Moderate sized vessel with diffuse disease extending from the ostium down  to the apex. The proximal vessel has diffuse 70% stenosis. The mid vessel has 100% occlusion. The diagonal branch is small in caliber, diffusely diseased and fills antegrade.  Circumflex Artery: Moderate sized vessel with 50% ostial stenosis. The first obtuse marginal branch is occluded at the ostium and fills from the vein graft. The mid vessel has a 70-80% stenosis at the takeoff of the small caliber second marginal branch. The small caliber second marginal branch has diffuse 90% narrowing proximally. The severe stenosis in the mid vessel appears unchanged from last angiogram.  Right Coronary Artery: Small, non-dominant vessel with diffuse disease in the proximal vessel and 100% total occlusion in the mid vessel.  Graft Anatomy:  SVG to diagonal branch is occluded  SVG to first OM branch is patent with 40% stenosis in mid body of vein graft. The target vessel is small with diffuse disease.  LIMA to mid LAD is patent. The distal LAD is diffusely diseased beyond the graft insertion.  Left Ventricular Angiogram: LVEF 45-50%  Impression:  1. Severe triple vessel CAD s/p 3V CABG with 2/3 patent bypass grafts.  2. 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  3. Mild LV systolic dysfunction.  Recommendations: She does not have good targets for PCI. The mid Circumflex stenosis could be approached with PCI, however, this stenosis is unchanged from cath 2 years ago and is most likely not the reason for her recent change in symptoms. She has been off of all cardiac meds since her cholecystectomy in August 2012. Will resume Plavix as she is intolerant to ASA. Will resume beta blocker. Will continue Norvasc. She has not tolerated statins in the past. Will not start statin. She may need another trial of long acting nitrate. She tried it over the weekend and had a bad headache.  Complications: None; patient tolerated the  procedure well.     Assessment / Plan:  1. CAD - prior CABG with poor targets - small vessel disease - managed medically - back on her Plavix and Imdur but she has not tolerated the Imdur due to headaches - also started Toprol XL 50 mg which she seems to be tolerating. She has now restarted her Imdur and now on Norvasc with improvement in her symptoms. Will keep her on her current regimen. See her back in 6 weeks. Unfortunately I still think her overall prognosis is quite poor with very limited options given her coronary anatomy. Could still consider Ranexa if needed.   2. HTN - have added Norvasc and already increased her dose - BP has improved and she is improved clinically.   3. HLD - statin intolerant unfortunately - and now off of her Red Yeast Rice due to complaining of muscle soreness with this.   4. Carotid disease - for repeat study later today  See her back in 6 weeks. Stay with current regimen and hope she will continue to do ok.  Patient is agreeable to this plan and will call if any problems develop in the interim.   Rosalio MacadamiaLori C. Lysle Yero, RN, ANP-C  [facility]  [address]  [place], [place] [address]  [phone]

## 2013-05-13 NOTE — Patient Instructions (Addendum)
Stay on your current medicines  We will be calling the results of your carotid duplex later this week  See me & Dr. Antoine Poche in 6 weeks  Call the [facility] office at [phone] if you have any questions, problems or concerns.

## 2013-05-23 ENCOUNTER — Telehealth: Payer: Self-pay | Admitting: Nurse Practitioner

## 2013-05-23 NOTE — Telephone Encounter (Signed)
Called stating her BP has been even more elevated past few days.  States she takes Norvasc 5 mg in AM; 2.5 mg in PM.  Also takes Toprol XL 50 mg in AM.  Yesterday BP was 189/87.  Today she woke up with heart pounding.  Took BP at 6 AM and was 197/110; at 3:00 today was 189/110.  States she feels "wiped out" today.  States past couple of nights she has woken up during night with heart pounding.  Spoke w/Dr. Bonney Aid (DOD) who suggest she increase Norvasc to 5 mg BID.  He also suggests that if that doesn't bring BP down then may try increasing Toprol.  Advised pt to increase Norvasc and to keep record of her BP and HR.  If continues to be elevated to call us back. She understands and will keep record of BP.

## 2013-05-23 NOTE — Telephone Encounter (Signed)
New Message  Pt callled she states her BP is still running high/// Pt requests a call back to discuss

## 2013-05-28 ENCOUNTER — Telehealth: Payer: Self-pay | Admitting: *Deleted

## 2013-05-28 NOTE — Telephone Encounter (Signed)
Please ask her to keep [place] updated.

## 2013-05-28 NOTE — Telephone Encounter (Signed)
WHILE  GIVING   CAROTID RESULTS  PT  COMPLAINED OF  ELEVATED  B/P   ABOUT  AN HOUR AGO   B/P WAS  196/110 AND   ALSO   NOTED  SOME  BLEEDING TO  EYE  IS  CURRENTLY AT  EYE DR  INSTRUCTED TO HOLD  PLAVIX UNTIL FURTHER NOTICE .Zack Seal

## 2013-05-28 NOTE — Telephone Encounter (Signed)
S/w pt will keep in touch and let us know what happens stated might have to adjust medications pt stated bp in 198/190

## 2013-05-28 NOTE — Telephone Encounter (Signed)
Is she going to call back with an update?

## 2013-05-28 NOTE — Telephone Encounter (Signed)
S/w pt needs to go off plavix for 1 week due to surgery in the am  just left the eye doctor blind in the right eye with profuse bleeding thinks pt tore retina please advise

## 2013-05-29 ENCOUNTER — Ambulatory Visit: Payer: Self-pay | Admitting: Ophthalmology

## 2013-05-30 ENCOUNTER — Telehealth: Payer: Self-pay | Admitting: Nurse Practitioner

## 2013-05-30 NOTE — Telephone Encounter (Signed)
New message    FYI Pt want Danielle to know that she had eye surgery yesterday.  She will start back her medications today.

## 2013-06-24 ENCOUNTER — Encounter: Payer: Self-pay | Admitting: Nurse Practitioner

## 2013-06-24 ENCOUNTER — Ambulatory Visit (INDEPENDENT_AMBULATORY_CARE_PROVIDER_SITE_OTHER): Payer: Commercial Managed Care - HMO | Admitting: Nurse Practitioner

## 2013-06-24 VITALS — BP 100/60 | HR 89 | Ht 66.0 in | Wt 212.8 lb

## 2013-06-24 DIAGNOSIS — I2510 Atherosclerotic heart disease of native coronary artery without angina pectoris: Secondary | ICD-10-CM

## 2013-06-24 DIAGNOSIS — I10 Essential (primary) hypertension: Secondary | ICD-10-CM

## 2013-06-24 MED ORDER — AMLODIPINE BESYLATE 2.5 MG PO TABS: 2.5000 mg | ORAL_TABLET | Freq: Two times a day (BID) | ORAL | Status: DC

## 2013-06-24 NOTE — Progress Notes (Signed)
Kristy GrizzleMargaret [name] Date of Birth: [birth year] Medical Record #[id number]  History of Present Illness: Ms. [name] is seen back today for a 6 week check. Seen for Dr. Antoine PocheHochrein. She has known CAD with prior CABG in January of 2012 per Dr. Cornelius Moraswen. She had CABG x 3 and had poor target vessels for grafting due to distal disease. Her other problems include IDDM, HLD with statin intolerance and aspirin allergy. She also has PVD with bilateral carotid disease - 60 to 79% on the right and 40 to 59% on the left - last studied in March of 2015 and 6 month follow up recommended. She was last cathed in September of 2013 - medical management recommended - has 2/3 grafts patent.   Last seen here in April of 2014 by Dr. Antoine PocheHochrein - was felt to be doing well.   Seen earlier by me in February of 2015 and was not doing well. Her husband died after [facility] for 5 1/2 months and had died. She was having chest pain and palpitations. Had stopped all of her cardiac medicines - not clear to me as to why. She did not remember what the side effects were. I put her back on her Plavix, added back Imdur, added Toprol and updated her carotid duplex.   Seen back several times since - back on Imdur and Norvasc. Was doing better clinically. Less chest pain. Had more energy. Then had bleeding in her eye - has been to opthalmology. Was off her Plavix for a short term and ended up having some surgery on her eye.   Comes back today. Here alone. She is doing well. Very little chest pain. Does have more swelling. BP is great - actually running very low at home - has held some evening doses of her Norvasc. [facility] - just had that checked - does not go back for a year. More energy overall since her chest pain is controlled. Has looked in to Ranexa - would cost her about 45 dollars a month which is not a problem. She is very pleased with how she is doing.    Current Outpatient Prescriptions    Medication Sig Dispense Refill  . acetaminophen (TYLENOL) 325 MG tablet Take 650 mg by mouth as needed. For pain      . amLODipine (NORVASC) 5 MG tablet Take ( 5 mg ) in the am (2.5 mg ) in the pm  90 tablet  3  . brimonidine-timolol (COMBIGAN) 0.2-0.5 % ophthalmic solution Place 1 drop into the right eye 2 (two) times daily.      . clopidogrel (PLAVIX) 75 MG tablet Take 1 tablet (75 mg total) by mouth daily.  90 tablet  3  . furosemide (LASIX) 20 MG tablet Take 1 tablet (20 mg total) by mouth as needed.  30 tablet  6  . glimepiride (AMARYL) 1 MG tablet Take 1 mg by mouth daily with breakfast.      . insulin glargine (LANTUS) 100 UNIT/ML injection Inject 30 Units into the skin at bedtime. Based on sugar levels      . isosorbide mononitrate (IMDUR) 30 MG 24 hr tablet Take 30 mg by mouth daily.       . metoprolol succinate (TOPROL-XL) 50 MG 24 hr tablet Take 1 tablet (50 mg total) by mouth daily. Take with or immediately following a meal.  90 tablet  3  . prednisoLONE acetate (PRED FORTE) 1 % ophthalmic suspension  Place 1 drop into the right eye 2 (two) times daily.       . nitroGLYCERIN (NITROSTAT) 0.4 MG SL tablet Place 1 tablet (0.4 mg total) under the tongue every 5 (five) minutes as needed for chest pain (up to 3 doses).  25 tablet  3   No current facility-administered medications for this visit.    Allergies  Allergen Reactions  . Bee Venom Anaphylaxis  . Aspirin Hives  . Codeine Hives  . Cortisone Hives  . Demerol Hives  . Meperidine Hcl Hives  . Prednisone Hives    Past Medical History  Diagnosis Date  . CAD (coronary artery disease)     S/P CABG January 2012; had poor target vessels for grafting.  A cath  9/13, LAD 100%, diagonal occluded, diffuse second marginal 90% stenosis, first obtuse marginal occluded, nondominant right coronary occluded, saphenous vein graft to the diagonal occluded, saphenous vein graft to OM 40% stenosis, LIMA to the LAD patent   . S/P cardiac  catheterization 03/2010    EF 50%   . Orthostatic hypotension     previous Midodrine therapy  . DM (diabetes mellitus)   . Obesity   . HLD (hyperlipidemia)     statin intolerant  . Aspirin allergy   . PVD (peripheral vascular disease)     prior balloon PCI to left leg in [facility]    Past Surgical History  Procedure Laterality Date  . Coronary artery bypass graft  03/2010  . Cardiac catheterization  03/2010  . Carotid dopplers  03/2010    40-59% bilateral ICA stenosis  . Appendectomy    . Tonsillectomy    . Cholecystectomy  2012  . Other surgical history      cataract; bilateral  . Other surgical history      angioplasty left leg  . Eye surgery      laser  . Tubal ligation    . Angioplasty / stenting femoral  03/14/2011    History  Smoking status  . Never Smoker   Smokeless tobacco  . Never Used    History  Alcohol Use No    Family History  Problem Relation Age of Onset  . Esophageal cancer Mother   . Heart attack Father     MI    Review of Systems: The review of systems is per the HPI.  All other systems were reviewed and are negative.  Physical Exam: BP 100/60  Pulse 89  Ht 5\' 6"  (1.676 m)  Wt 212 lb 12.8 oz (96.525 kg)  BMI 34.36 kg/m2  SpO2 97% Patient is very pleasant and in no acute distress. Skin is warm and dry. Color is normal.  HEENT is unremarkable. Normocephalic/atraumatic. PERRL. Sclera are nonicteric. Neck is supple. No masses. No JVD. Lungs are clear. Cardiac exam shows a regular rate and rhythm. Abdomen is soft. Extremities are with trace edema. Some brawny stasis changes on each lower leg. Gait and ROM are intact. No gross neurologic deficits noted.  Wt Readings from Last 3 Encounters:  06/24/13 212 lb 12.8 oz (96.525 kg)  05/13/13 210 lb (95.255 kg)  05/06/13 211 lb (95.709 kg)     LABORATORY DATA: Lab Results  Component Value Date   WBC 5.8 11/19/2011   HGB 11.8* 11/19/2011   HCT 35.0* 11/19/2011   PLT 157 11/19/2011   GLUCOSE 131*  11/21/2011   CHOL 289* 11/20/2011   TRIG 201* 11/20/2011   HDL 51 11/20/2011   LDLDIRECT 182* 11/20/2011   LDLCALC  198* 11/20/2011   ALT 13 10/02/2011   AST 18 10/02/2011   NA 139 11/21/2011   K 4.0 11/21/2011   CL 106 11/21/2011   CREATININE 0.76 11/21/2011   BUN 19 11/21/2011   CO2 20 11/21/2011   TSH 1.370 11/15/2011   INR 1.02 11/20/2011   HGBA1C 7.2* 10/03/2011   Angiographic Findings from September 2013  Left main: Distal 30% stenosis.  Left Anterior Descending Artery: Moderate sized vessel with diffuse disease extending from the ostium down to the apex. The proximal vessel has diffuse 70% stenosis. The mid vessel has 100% occlusion. The diagonal branch is small in caliber, diffusely diseased and fills antegrade.  Circumflex Artery: Moderate sized vessel with 50% ostial stenosis. The first obtuse marginal branch is occluded at the ostium and fills from the vein graft. The mid vessel has a 70-80% stenosis at the takeoff of the small caliber second marginal branch. The small caliber second marginal branch has diffuse 90% narrowing proximally. The severe stenosis in the mid vessel appears unchanged from last angiogram.  Right Coronary Artery: Small, non-dominant vessel with diffuse disease in the proximal vessel and 100% total occlusion in the mid vessel.  Graft Anatomy:  SVG to diagonal branch is occluded  SVG to first OM branch is patent with 40% stenosis in mid body of vein graft. The target vessel is small with diffuse disease.  LIMA to mid LAD is patent. The distal LAD is diffusely diseased beyond the graft insertion.  Left Ventricular Angiogram: LVEF 45-50%  Impression:  1. Severe triple vessel CAD s/p 3V CABG with 2/3 patent bypass grafts.  2. 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  3. Mild LV systolic dysfunction.  Recommendations: She does not have good targets for PCI. The mid Circumflex stenosis could be approached with PCI, however, this stenosis is unchanged from cath 2 years ago and is most  likely not the reason for her recent change in symptoms. She has been off of all cardiac meds since her cholecystectomy in August 2012. Will resume Plavix as she is intolerant to ASA. Will resume beta blocker. Will continue Norvasc. She has not tolerated statins in the past. Will not start statin. She may need another trial of long acting nitrate. She tried it over the weekend and had a bad headache.  Complications: None; patient tolerated the procedure well.     Assessment / Plan:  1. CAD - prior CABG with poor targets - small vessel disease - managed medically - back on her Plavix and Imdur and Toprol XL 50 mg which she seems to be tolerating. She has had improvement in her symptoms clinically.  Will keep her on her current regimen. Discussed with Dr. Antoine Poche today who is in agreement.  Unfortunately I still think her overall prognosis is quite poor with very limited options given her coronary anatomy and we could still consider Ranexa if needed. See her back in 3 months.   2. HTN - BP running low - will cut the Norvasc back to 2.5 mg BID. This should improve her swelling as well.   3. HLD - statin intolerant unfortunately - and intolerant to even Red Yeast Rice due to complaining of muscle soreness.   4. Carotid disease - has 60 to 70% on the right and 40 to 59% on the left with recommended repeat study in 6 months. Patient is aware.   Patient is agreeable to this plan and will call if any problems develop in the interim.  Burtis Junes, RN, [place] [address] [place], [place]  [phone]

## 2013-06-24 NOTE — Patient Instructions (Signed)
Cut the Norvasc back to 2.5 mg two times a day - this is at the drug store.  See Dr. Antoine Poche in 3 months - he will be at the [facility] office  Stay on your current medicines.  Call the [facility] office at [phone] if you have any questions, problems or concerns.

## 2013-07-08 ENCOUNTER — Telehealth: Payer: Self-pay | Admitting: Nurse Practitioner

## 2013-07-08 DIAGNOSIS — I10 Essential (primary) hypertension: Secondary | ICD-10-CM

## 2013-07-08 MED ORDER — NITROGLYCERIN 0.4 MG SL SUBL: 0.4000 mg | SUBLINGUAL_TABLET | SUBLINGUAL | Status: DC | PRN

## 2013-07-08 NOTE — Telephone Encounter (Signed)
Discussed with Dr.Hochrein and he advised that the patient take NTG when her BP is very high and ordered a 24 hour BP monitor. Called her back and she is aware of the plan. Order for monitor sent to [facility]. Patient aware that [facility] will call her back to schedule. Patient's NTG is 70 year old so will send in new script.

## 2013-07-08 NOTE — Telephone Encounter (Addendum)
Called patient back. She states that her BP is not well controlled in the past week. Last night she woke up at 2 am with chest pressure and pounding heart and BP was 202/170 with a HR of 110. Did not take NTG. She checked it again at 1030am and it was 189/110 with a HR of 80. States that at other times this week her BP was 90/60. Currently taking Norvasc 2.5mg  BID and Metoprolol XL 50mg  every day and Imdur 30mg  every day. Advised will discuss with Dr.Hochrein and call her back.

## 2013-07-08 NOTE — Telephone Encounter (Signed)
New message     Problems with blood pressure

## 2013-07-09 ENCOUNTER — Encounter (INDEPENDENT_AMBULATORY_CARE_PROVIDER_SITE_OTHER): Payer: Medicare HMO

## 2013-07-09 DIAGNOSIS — I10 Essential (primary) hypertension: Secondary | ICD-10-CM

## 2013-07-15 ENCOUNTER — Telehealth: Payer: Self-pay | Admitting: Cardiology

## 2013-07-15 NOTE — Telephone Encounter (Signed)
24 hr monitor results given to Dr Antoine Poche 4/28 - will forward to him to review and recommendations

## 2013-07-15 NOTE — Telephone Encounter (Signed)
New message    Patient calling c/o blood pressure issues . Patient stated this is an on-going problem . Today 184/110, pulse 83 . Yesterday  197/110  Pulse 80.

## 2013-07-16 NOTE — Telephone Encounter (Signed)
Patient is calling again b/c she is really concerned about her BP. This morning it is 192/56 rate 100. Please call and advise.

## 2013-07-16 NOTE — Telephone Encounter (Signed)
I will see her in the office as an add on next week.

## 2013-07-16 NOTE — Telephone Encounter (Signed)
Spoke with pt who states her BP is all over the place.  It has been elevated most all the time lately.  Frequently systolic is above 200 and diastolic is above 100 but she has had the occasion when in plummets in the afternoon around 3:30 pm to around 80/50.   She has been taking all medications as instructed.  She has not been using the SL Ntg for elevated BP in the AM because she states she didn't know how high it needed to be in order to take it.  I instructed her to use the SL Ntg for a BP above 180/100.   She did wear a 24 hr BP monitor but reports she only wore it for about 5 hours and therefore is not a good representation of what her BP is.  She reports only wearing it a short period of time because it kept falling off and it was "a pain in the butt."  Aware I will review with Dr Antoine Poche and call her back.

## 2013-07-16 NOTE — Telephone Encounter (Signed)
Per Dr Antoine Poche - pt needs to be scheduled to be seen to adjust medications and discuss HTN.

## 2013-07-16 NOTE — Telephone Encounter (Signed)
Pt aware of appt scheduled for 07/22/2013 at 8:45 am

## 2013-07-22 ENCOUNTER — Ambulatory Visit (INDEPENDENT_AMBULATORY_CARE_PROVIDER_SITE_OTHER): Payer: Medicare HMO | Admitting: Cardiology

## 2013-07-22 ENCOUNTER — Encounter: Payer: Self-pay | Admitting: Cardiology

## 2013-07-22 VITALS — BP 118/71 | HR 86 | Ht 65.0 in | Wt 212.0 lb

## 2013-07-22 DIAGNOSIS — I208 Other forms of angina pectoris: Secondary | ICD-10-CM

## 2013-07-22 DIAGNOSIS — I209 Angina pectoris, unspecified: Secondary | ICD-10-CM

## 2013-07-22 DIAGNOSIS — I951 Orthostatic hypotension: Secondary | ICD-10-CM

## 2013-07-22 DIAGNOSIS — I2510 Atherosclerotic heart disease of native coronary artery without angina pectoris: Secondary | ICD-10-CM

## 2013-07-22 MED ORDER — CLONIDINE HCL 0.1 MG PO TABS: ORAL_TABLET | ORAL | Status: DC

## 2013-07-22 NOTE — Progress Notes (Signed)
HPI The patient presents for followup of coronary disease status post CABG.  Since I last saw her she has seen [name] multiple times.  She has had a complicated course as described recently. She came off of all of her medicines while she was caring for her husband who is sick. She has slowly had these restarted.  She has multiple complaints still with labile blood pressures. I tried to have her wear a blood pressure monitor but she couldn't tolerate wearing this and she had problems with the blood pressure cuff. She reports increasing edema and joint pains that she relates to her medications although she does have a history of arthritis. She has had some swelling. Her blood pressure is still labile and she feels very bad in particular when it is low. She's not having any new chest pressure, neck or arm discomfort. Her biggest complaint seems to be fatigue.  Allergies  Allergen Reactions  . Bee Venom Anaphylaxis  . Aspirin Hives  . Codeine Hives  . Cortisone Hives  . Demerol Hives  . Meperidine Hcl Hives  . Prednisone Hives    Current Outpatient Prescriptions  Medication Sig Dispense Refill  . acetaminophen (TYLENOL) 325 MG tablet Take 650 mg by mouth as needed. For pain      . amLODipine (NORVASC) 2.5 MG tablet Take 1 tablet (2.5 mg total) by mouth 2 (two) times daily.  90 tablet  3  . clopidogrel (PLAVIX) 75 MG tablet Take 1 tablet (75 mg total) by mouth daily.  90 tablet  3  . furosemide (LASIX) 20 MG tablet Take 1 tablet (20 mg total) by mouth as needed.  30 tablet  6  . glimepiride (AMARYL) 1 MG tablet Take 1 mg by mouth 2 (two) times daily.       . insulin glargine (LANTUS) 100 UNIT/ML injection Inject 32 Units into the skin at bedtime. Based on sugar levels      . isosorbide mononitrate (IMDUR) 30 MG 24 hr tablet Take 30 mg by mouth daily.       . metoprolol succinate (TOPROL-XL) 50 MG 24 hr tablet Take 1 tablet (50 mg total) by mouth daily. Take with or immediately  following a meal.  90 tablet  3  . nitroGLYCERIN (NITROSTAT) 0.4 MG SL tablet Place 1 tablet (0.4 mg total) under the tongue every 5 (five) minutes as needed for chest pain (up to 3 doses).  25 tablet  3  . TRUETEST TEST test strip        No current facility-administered medications for this visit.    Past Medical History  Diagnosis Date  . CAD (coronary artery disease)     S/P CABG January 2012; had poor target vessels for grafting.  A cath  9/13, LAD 100%, diagonal occluded, diffuse second marginal 90% stenosis, first obtuse marginal occluded, nondominant right coronary occluded, saphenous vein graft to the diagonal occluded, saphenous vein graft to OM 40% stenosis, LIMA to the LAD patent   . S/P cardiac catheterization 03/2010    EF 50%   . Orthostatic hypotension     previous Midodrine therapy  . DM (diabetes mellitus)   . Obesity   . HLD (hyperlipidemia)     statin intolerant  . Aspirin allergy   . PVD (peripheral vascular disease)     prior balloon PCI to left leg in [facility]    Past Surgical History  Procedure Laterality Date  . Coronary artery bypass graft  03/2010  .  Cardiac catheterization  03/2010  . Carotid dopplers  03/2010    40-59% bilateral ICA stenosis  . Appendectomy    . Tonsillectomy    . Cholecystectomy  2012  . Other surgical history      cataract; bilateral  . Other surgical history      angioplasty left leg  . Eye surgery      laser  . Tubal ligation    . Angioplasty / stenting femoral  03/14/2011    ROS:  As stated in the HPI and negative for all other systems.  PHYSICAL EXAM BP 118/71  Pulse 86  Ht 5\' 5"  (1.651 m)  Wt 212 lb (96.163 kg)  BMI 35.28 kg/m2 GENERAL:  Well appearing HEENT:  Pupils equal round and reactive, fundi not visualized, oral mucosa unremarkable NECK:  No jugular venous distention, waveform within normal limits, carotid upstroke brisk and symmetric, no bruits, no thyromegaly LYMPHATICS:  No cervical, inguinal  adenopathy LUNGS:  Clear to auscultation bilaterally BACK:  No CVA tenderness CHEST:  Well healed sternotomy scar. HEART:  PMI not displaced or sustained,S1 and S2 within normal limits, no S3, no S4, no clicks, no rubs, no murmurs ABD:  Flat, positive bowel sounds normal in frequency in pitch, no bruits, no rebound, no guarding, no midline pulsatile mass, no hepatomegaly, no splenomegaly EXT:  2 plus pulses throughout, mild diffuse edema in hands and feet, no cyanosis no clubbing   ASSESSMENT AND PLAN   CAROTID ARTERY STENOSIS, BILATERAL -  She has 40 - 59% left and 60-79% left stenosis in March and she will have followup again in September.  CAD - She has severe small vessel disease.  This will continue to be managed medically.  At this point she would need to be managed medically and salt and fluid and calorie restriction would be pivotal. She claims to be very compliant with all of this year her weight is up 32 pounds over time. This is a difficult situation but I think the current medical regimen is reasonable.  HYPERLIPIDEMIA -  She has been intolerant of statins unfortunately and she will proceed with diet control.  HTN - Her blood pressure is quite labile.  She has Severe hypotensive episodes which are most problematic for her. She has been intolerant of multiple medications and really does not want to take meds.  There is [name] About possibly starting an ACE inhibitor or ARB which would be reasonable for renal protection given her severe diabetes. However, I don't think she will tolerate this.  Of note I did give her a when necessary clonidine to take 0.1-0.05 mg as needed for any blood pressure spikes.

## 2013-07-22 NOTE — Patient Instructions (Addendum)
Your physician has recommended you make the following change in your medication:   1. Start Clonidine 0.1 mg 1 tablet as needed.   Your physician recommends that you schedule a follow-up appointment in: 1 month

## 2013-08-12 ENCOUNTER — Encounter: Payer: Self-pay | Admitting: Cardiology

## 2013-08-26 ENCOUNTER — Ambulatory Visit: Payer: Medicare HMO | Admitting: Cardiology

## 2013-09-04 ENCOUNTER — Telehealth: Payer: Self-pay | Admitting: Nurse Practitioner

## 2013-09-04 ENCOUNTER — Ambulatory Visit (INDEPENDENT_AMBULATORY_CARE_PROVIDER_SITE_OTHER): Payer: Commercial Managed Care - HMO | Admitting: Nurse Practitioner

## 2013-09-04 VITALS — BP 120/70 | HR 79 | Ht 65.0 in | Wt 213.1 lb

## 2013-09-04 DIAGNOSIS — I739 Peripheral vascular disease, unspecified: Secondary | ICD-10-CM

## 2013-09-04 DIAGNOSIS — I208 Other forms of angina pectoris: Secondary | ICD-10-CM

## 2013-09-04 DIAGNOSIS — I209 Angina pectoris, unspecified: Secondary | ICD-10-CM

## 2013-09-04 DIAGNOSIS — I2089 Other forms of angina pectoris: Secondary | ICD-10-CM

## 2013-09-04 DIAGNOSIS — I779 Disorder of arteries and arterioles, unspecified: Secondary | ICD-10-CM

## 2013-09-04 MED ORDER — ISOSORBIDE MONONITRATE ER 30 MG PO TB24: 60.0000 mg | ORAL_TABLET | Freq: Every day | ORAL | Status: DC

## 2013-09-04 NOTE — Patient Instructions (Addendum)
Stay on your current medicines but I am increasing the Imdur to 60 mg at night - take two of your 30 mg tablets to equal that dose.   Use your NTG for chest pain - Use your NTG under your tongue for recurrent chest pain. May take one tablet every 5 minutes. If you are still having discomfort after 3 tablets in 15 minutes, call [phone].  Pick up the Clonidine - use when your blood pressure is over 200 systolic as a one time dose.  I will see you on July 7th  Call the [facility] office at [phone] if you have any questions, problems or concerns.

## 2013-09-04 NOTE — Telephone Encounter (Signed)
Follow up ° ° °Pt returned your call °

## 2013-09-04 NOTE — Progress Notes (Signed)
[name] Date of Birth: Mar 08, 1946 Medical Record #[id number]  History of Present Illness: [name] is seen back today for a 6 week check. Seen for Dr. Antoine Poche. She has known CAD with prior CABG in January of 2012 per Dr. Cornelius Moras. She had CABG x 3 and had poor target vessels for grafting due to distal disease. Her other problems include IDDM, HLD with statin intolerance and aspirin allergy. She also has PVD with bilateral carotid disease - 60 to 79% on the right and 40 to 59% on the left - last studied in March of 2015 and 6 month follow up recommended - for September 2015. She was last cathed in September of 2013 - medical management recommended - has 2/3 grafts patent.   Seen several times by me this year for several issues. Her husband died after being in the hospital at [facility] for 5 1/2 months and had died. She was having chest pain and palpitations. Had stopped all of her cardiac medicines - not clear to me as to why. She did not remember what the side effects were. I put her back on her Plavix, added back Imdur, added Toprol and updated her carotid duplex. She was doing better clinically. Less chest pain. Had more energy. Then had bleeding in her eye - has been to opthalmology. Was off her Plavix for a short term and ended up having some surgery on her eye. We have discussed starting Ranexa and she has looked in to the cost - would cost her about 45 dollars a month which she reported to not be a problem.  Saw Dr. Antoine Poche in May - BP labile - he did not feel like she would tolerate ACE/ARB. Gave her prn clonidine to use.   Comes back today. Here alone. She has good days and bad days. Has been having more angina. One day had a spell that lasted 1 hour and 17 minutes - this was last week. Pretty intense. No NTG use - "didn't really want to use". Did not pick up the clonidine - pharmacist told her that was not a good idea for her. BP better with switching all of her medicines to nighttime -  now having less low BPs. Some headache noted already.  She has had to cut back on her work. Not able to exercise - very limited due to her angina.   Current Outpatient Prescriptions  Medication Sig Dispense Refill  . acetaminophen (TYLENOL) 325 MG tablet Take 650 mg by mouth as needed. For pain      . amLODipine (NORVASC) 2.5 MG tablet Take 2.5 mg by mouth daily.      . clopidogrel (PLAVIX) 75 MG tablet Take 1 tablet (75 mg total) by mouth daily.  90 tablet  3  . furosemide (LASIX) 20 MG tablet Take 1 tablet (20 mg total) by mouth as needed.  30 tablet  6  . glimepiride (AMARYL) 1 MG tablet Take 1 mg by mouth 2 (two) times daily.       . insulin glargine (LANTUS) 100 UNIT/ML injection Inject 32 Units into the skin at bedtime. Based on sugar levels      . isosorbide mononitrate (IMDUR) 30 MG 24 hr tablet Take 30 mg by mouth daily.       . metoprolol succinate (TOPROL-XL) 50 MG 24 hr tablet Take 1 tablet (50 mg total) by mouth daily. Take with or immediately following a meal.  90 tablet  3  . nitroGLYCERIN (NITROSTAT) 0.4  MG SL tablet Place 1 tablet (0.4 mg total) under the tongue every 5 (five) minutes as needed for chest pain (up to 3 doses).  25 tablet  3  . TRUETEST TEST test strip        No current facility-administered medications for this visit.    Allergies  Allergen Reactions  . Bee Venom Anaphylaxis  . Aspirin Hives  . Codeine Hives  . Cortisone Hives  . Demerol Hives  . Meperidine Hcl Hives  . Prednisone Hives    Past Medical History  Diagnosis Date  . CAD (coronary artery disease)     S/P CABG January 2012; had poor target vessels for grafting.  A cath  9/13, LAD 100%, diagonal occluded, diffuse second marginal 90% stenosis, first obtuse marginal occluded, nondominant right coronary occluded, saphenous vein graft to the diagonal occluded, saphenous vein graft to OM 40% stenosis, LIMA to the LAD patent   . S/P cardiac catheterization 03/2010    EF 50%   . Orthostatic  hypotension     previous Midodrine therapy  . DM (diabetes mellitus)   . Obesity   . HLD (hyperlipidemia)     statin intolerant  . Aspirin allergy   . PVD (peripheral vascular disease)     prior balloon PCI to left leg in [facility]    Past Surgical History  Procedure Laterality Date  . Coronary artery bypass graft  03/2010  . Cardiac catheterization  03/2010  . Carotid dopplers  03/2010    40-59% bilateral ICA stenosis  . Appendectomy    . Tonsillectomy    . Cholecystectomy  2012  . Other surgical history      cataract; bilateral  . Other surgical history      angioplasty left leg  . Eye surgery      laser  . Tubal ligation    . Angioplasty / stenting femoral  03/14/2011    History  Smoking status  . Never Smoker   Smokeless tobacco  . Never Used    History  Alcohol Use No    Family History  Problem Relation Age of Onset  . Esophageal cancer Mother   . Heart attack Father     MI    Review of Systems: The review of systems is per the HPI.  All other systems were reviewed and are negative.  Physical Exam: BP 120/70  Pulse 79  Ht 5\' 5"  (1.651 m)  Wt 213 lb 1.9 oz (96.671 kg)  BMI 35.47 kg/m2 Patient is very pleasant and in no acute distress. Skin is warm and dry. Color is normal.  HEENT is unremarkable but with poor dentition. Normocephalic/atraumatic. PERRL. Sclera are nonicteric. Neck is supple. No masses. No JVD. Lungs are clear. Cardiac exam shows a regular rate and rhythm. Abdomen is soft. Extremities are without edema. Gait and ROM are intact. No gross neurologic deficits noted.   LABORATORY DATA: EKG with sinus rhythm. No acute changes.   Lab Results  Component Value Date   WBC 5.8 11/19/2011   HGB 11.8* 11/19/2011   HCT 35.0* 11/19/2011   PLT 157 11/19/2011   GLUCOSE 131* 11/21/2011   CHOL 289* 11/20/2011   TRIG 201* 11/20/2011   HDL 51 11/20/2011   LDLDIRECT 182* 11/20/2011   LDLCALC 198* 11/20/2011   ALT 13 10/02/2011   AST 18 10/02/2011   NA 139 11/21/2011    K 4.0 11/21/2011   CL 106 11/21/2011   CREATININE 0.76 11/21/2011   BUN 19 11/21/2011  CO2 20 11/21/2011   TSH 1.370 11/15/2011   INR 1.02 11/20/2011   HGBA1C 7.2* 10/03/2011    BNP (last 3 results) No results found for this basename: PROBNP,  in the last 8760 hours  Angiographic Findings from September 2013  Left main: Distal 30% stenosis.  Left Anterior Descending Artery: Moderate sized vessel with diffuse disease extending from the ostium down to the apex. The proximal vessel has diffuse 70% stenosis. The mid vessel has 100% occlusion. The diagonal branch is small in caliber, diffusely diseased and fills antegrade.  Circumflex Artery: Moderate sized vessel with 50% ostial stenosis. The first obtuse marginal branch is occluded at the ostium and fills from the vein graft. The mid vessel has a 70-80% stenosis at the takeoff of the small caliber second marginal branch. The small caliber second marginal branch has diffuse 90% narrowing proximally. The severe stenosis in the mid vessel appears unchanged from last angiogram.  Right Coronary Artery: Small, non-dominant vessel with diffuse disease in the proximal vessel and 100% total occlusion in the mid vessel.  Graft Anatomy:  SVG to diagonal branch is occluded  SVG to first OM branch is patent with 40% stenosis in mid body of vein graft. The target vessel is small with diffuse disease.  LIMA to mid LAD is patent. The distal LAD is diffusely diseased beyond the graft insertion.  Left Ventricular Angiogram: LVEF 45-50%  Impression:  1. Severe triple vessel CAD s/p 3V CABG with 2/3 patent bypass grafts.  2. 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  3. Mild LV systolic dysfunction.  Recommendations: She does not have good targets for PCI. The mid Circumflex stenosis could be approached with PCI, however, this stenosis is unchanged from cath 2 years ago and is most likely not the reason for her recent change in symptoms. She has been off of all cardiac  meds since her cholecystectomy in August 2012. Will resume Plavix as she is intolerant to ASA. Will resume beta blocker. Will continue Norvasc. She has not tolerated statins in the past. Will not start statin. She may need another trial of long acting nitrate. She tried it over the weekend and had a bad headache.  Complications: None; patient tolerated the procedure well.    Assessment / Plan:  1. CAD - prior CABG with poor targets - small vessel disease - managed medically - back on her Plavix and Imdur and Toprol XL 50 mg  - now with recurrent angina. Will increase her Imdur to 60 mg at night. She is advised to use sl NTG - will see back in 2 weeks.  Unfortunately I still think her overall prognosis is quite poor with very limited options given her coronary anatomy and we could still consider Ranexa if needed.   2. HTN - BP less labile since she has changed her schedule of taking her medicines. Would still like her to have the clonidine on hand as needed.  3. HLD - statin intolerant unfortunately - and intolerant to even Red Yeast Rice due to complaining of muscle soreness.   4. Carotid disease - has 60 to 70% on the right and 40 to 59% on the left with recommended repeat study in 6 months - for September.   Patient is agreeable to this plan and will call if any problems develop in the interim.   Rosalio MacadamiaLori C. Chrissy Ealey, RN, ANP-C  [facility]  [address]  [place], [place] [address]  [phone]

## 2013-09-05 NOTE — Telephone Encounter (Signed)
Spoke with pt and told her she is due for 6 month follow up carotid dopplers in September 2015. She will arrange this appt when here to see [name], NP next month.

## 2013-09-05 NOTE — Telephone Encounter (Signed)
New Message:  Pt states she is calling to find out why Lawson Fiscal wants her to have a carotid when she just had one in march. Pt wants to speak to a clinical staff member that can help her understand.

## 2013-09-17 ENCOUNTER — Ambulatory Visit: Payer: Medicare HMO | Admitting: Nurse Practitioner

## 2013-10-01 ENCOUNTER — Ambulatory Visit: Payer: Medicare HMO | Admitting: Nurse Practitioner

## 2013-11-12 ENCOUNTER — Telehealth: Payer: Self-pay | Admitting: Cardiology

## 2013-11-12 MED ORDER — ISOSORBIDE MONONITRATE ER 30 MG PO TB24: 90.0000 mg | ORAL_TABLET | Freq: Every day | ORAL | Status: DC

## 2013-11-12 NOTE — Telephone Encounter (Signed)
Increase Imdur 90 mg daily.  Reschedule follow up with Lawson Fiscal.

## 2013-11-12 NOTE — Telephone Encounter (Signed)
Patient informed of med change per Dr. Antoine Poche - med list updated and refill sent to pharmacy. Message forwarded to [facility]. Scheduling Pool to get back scheduled to see Lawson Fiscal, NP

## 2013-11-12 NOTE — Telephone Encounter (Signed)
Returned call to patient.   She reports that for about 1 week she has had hypertensive episodes that are associated with chest pain. BP up to 180s systolic. She reports taking SL NTG for relief of both hypertension/CP and that after about 15 minutes her BP will come down to "normal" and her chest pain will subside, but will not last. She reports the most NTG she has taken in a 24hr period is 2 separate doses.   She reports dizziness when lying down, and states she has always had some shortness of breath (this is not new or worse).   She reports she takes her medications at night.   She reports a "tremendous amount of stress lately" of which she feels may be contributing to her BP and chest discomfort.   She last saw [name], NP in June and was supposed to follow up in July but states she felt fine so she canceled that appointment   Message with be deferred to Dr. Antoine Poche to advise.

## 2013-11-12 NOTE — Telephone Encounter (Signed)
New message           C/o high blood pressure / today bp is 184/110 hr is 111 / pt took nitroglycerin last night but pt has not taken it today / please give pt a call

## 2013-11-13 ENCOUNTER — Encounter: Payer: Self-pay | Admitting: Cardiology

## 2013-11-13 ENCOUNTER — Ambulatory Visit (INDEPENDENT_AMBULATORY_CARE_PROVIDER_SITE_OTHER): Payer: Commercial Managed Care - HMO | Admitting: Cardiology

## 2013-11-13 VITALS — BP 160/80 | HR 85 | Ht 65.0 in | Wt 216.0 lb

## 2013-11-13 DIAGNOSIS — R079 Chest pain, unspecified: Secondary | ICD-10-CM

## 2013-11-13 NOTE — Patient Instructions (Signed)
Your physician has requested that you regularly monitor and record your blood pressure readings at home. Please use the same machine at the same time of day to check your readings and record them to bring to your follow-up visit.  Your physician recommends that you continue on your current medications as directed. Please refer to the Current Medication list given to you today.      Your physician recommends that you schedule a follow-up appointment in: 3 months with Dr. Kirtland Bouchard

## 2013-11-13 NOTE — Progress Notes (Signed)
11/13/2013 [name]   26-Nov-1945  [id number]  Primary Physicia [name],[name] Primary Cardiologist: Dr. Antoine Poche  HPI:  The patient is a 68 y/o female, followed by Dr. Antoine Poche, who presents to clinic today with complaints of recurrent angina. She has known CAD with prior CABG in January of 2012 per Dr. Cornelius Moras. She had CABG x 3 and had poor target vessels for grafting due to distal disease. Her other problems include IDDM, HLD with statin intolerance and aspirin allergy. She also has PVD with bilateral carotid disease - 60 to 79% on the right and 40 to 59% on the left - last studied in March of 2015 and 6 month follow up recommended. She was last cathed in September of 2013 - medical management recommended - has 2/3 grafts patent.   Today in clinic, she notes recurrent anginal like symptoms that have been occuring intermittently for the last several weeks. Described as substernal to left sided chest pressure/heaviness. Occurs at rest and often at night awakening her from her sleep. Relieved with sublingual nitroglycerine almost immediately. She denies any prolonged episodes. No associated dyspnea, nausea, vomiting, diaphoresis, syncope/ near syncope. She reports full medication compliance. She reports that she called the office yesterday and was instructed by Dr. Antoine Poche to increase her Imdur from 60 to 90 mg daily. She tells me that she has yet to do this, so she is unsure as to whether or not this increase will improve her symptoms.     Current Outpatient Prescriptions  Medication Sig Dispense Refill  . acetaminophen (TYLENOL) 325 MG tablet Take 650 mg by mouth as needed. For pain      . amLODipine (NORVASC) 2.5 MG tablet Take 2.5 mg by mouth daily.      . clopidogrel (PLAVIX) 75 MG tablet Take 1 tablet (75 mg total) by mouth daily.  90 tablet  3  . furosemide (LASIX) 20 MG tablet Take 1 tablet (20 mg total) by mouth as needed.  30 tablet  6  . glimepiride (AMARYL) 1 MG tablet Take 1  mg by mouth 2 (two) times daily.       . insulin glargine (LANTUS) 100 UNIT/ML injection Inject 32 Units into the skin at bedtime. Based on sugar levels      . isosorbide mononitrate (IMDUR) 30 MG 24 hr tablet Take 3 tablets (90 mg total) by mouth daily.  270 tablet  3  . metoprolol succinate (TOPROL-XL) 50 MG 24 hr tablet Take 1 tablet (50 mg total) by mouth daily. Take with or immediately following a meal.  90 tablet  3  . nitroGLYCERIN (NITROSTAT) 0.4 MG SL tablet Place 1 tablet (0.4 mg total) under the tongue every 5 (five) minutes as needed for chest pain (up to 3 doses).  25 tablet  3  . TRUETEST TEST test strip        No current facility-administered medications for this visit.    Allergies  Allergen Reactions  . Bee Venom Anaphylaxis  . Aspirin Hives  . Atorvastatin Other (See Comments)    Other reaction(s): Other (See Comments) Arthralgias  . Codeine Hives  . Cortisone Hives  . Demerol Hives  . Meperidine Hives and Nausea And Vomiting  . Meperidine Hcl Hives  . Metformin Other (See Comments)    Intolerance  . Prednisone Hives    History   Social History  . Marital Status: Married    Spouse Name: N/A    Number of Children: N/A  . Years of  Education: N/A   Occupational History  . Self-employed    Social History Main Topics  . Smoking status: Never Smoker   . Smokeless tobacco: Never Used  . Alcohol Use: No  . Drug Use: No  . Sexual Activity: Not Currently   Other Topics Concern  . Not on file   Social History Narrative   Married   Three children     Review of Systems: General: negative for chills, fever, night sweats or weight changes.  Cardiovascular: negative for chest pain, dyspnea on exertion, edema, orthopnea, palpitations, paroxysmal nocturnal dyspnea or shortness of breath Dermatological: negative for rash Respiratory: negative for cough or wheezing Urologic: negative for hematuria Abdominal: negative for nausea, vomiting, diarrhea, bright red  blood per rectum, melena, or hematemesis Neurologic: negative for visual changes, syncope, or dizziness All other systems reviewed and are otherwise negative except as noted above.    Blood pressure 160/80, pulse 85, height  (1.651 m), weight 216 lb (97.977 kg).  General appearance: alert, cooperative and no distress Neck: no carotid bruit and no JVD Lungs: clear to auscultation bilaterally Heart: regular rate and rhythm, S1, S2 normal, no murmur, click, rub or gallop Extremities: no LEE Pulses: 2+ and symmetric Skin: warm and dry Neurologic: Grossly normal  EKG NSR. 85 bpm. No ischemic changes.   ASSESSMENT AND PLAN:   1. Recurrent angina: prior history of CABG. Her EKG compared to prior shows no new changes. Her last LHC revealed 2/3 patent grafts and severe small vessel disease. Per Dr. Jenene Slicker office notes, she is not a candidate for repeat revascularization options. We will need to continue to manage medically. He recently recommended that she increase her Imdur to 90 mg. She has not yet tried this and plans on initiating the increase today. If she continues to have recurrent angina despite increase in Imdur, I recommend targeting an increase in her BB, especially if she continues to be hypertensive on the increased dose of Imdur. Her BP today is 160/80 and HR is 85. By increasing her BB to further reduce BP and HR, we can decrease cardiac work-load and increase diastolic filling time, allowing for better coronary perfusion to help reduce recurrent angina. I recommended that we try this today, but patient is hesitant to do so in fear that we may drop her BP too low. She would like to try increasing her Imdur first. I feel this is reasonable. Ranexa can also be considered further down the road if she fails treatment on high dose BB + nitrate therapy.    PLAN  Continue medical management of CAD. Will further increase Imdur. F/u with Dr. Antoine Poche in 3 months or sooner if still  symptomatic.   SIMMONS, BRITTAINYPA-C 11/13/2013 4:32 PM

## 2013-11-25 ENCOUNTER — Other Ambulatory Visit: Payer: Self-pay | Admitting: Cardiology

## 2013-11-25 NOTE — Telephone Encounter (Signed)
Rx was sent to pharmacy electronically. 

## 2013-11-26 ENCOUNTER — Encounter (HOSPITAL_COMMUNITY): Payer: Medicare HMO

## 2013-12-03 ENCOUNTER — Ambulatory Visit: Payer: Medicare HMO | Admitting: Nurse Practitioner

## 2013-12-04 ENCOUNTER — Ambulatory Visit (HOSPITAL_COMMUNITY): Payer: Medicare HMO | Attending: Cardiology | Admitting: *Deleted

## 2013-12-04 ENCOUNTER — Ambulatory Visit (INDEPENDENT_AMBULATORY_CARE_PROVIDER_SITE_OTHER): Payer: Commercial Managed Care - HMO | Admitting: Physician Assistant

## 2013-12-04 ENCOUNTER — Encounter (HOSPITAL_COMMUNITY): Payer: Medicare HMO

## 2013-12-04 ENCOUNTER — Encounter: Payer: Self-pay | Admitting: *Deleted

## 2013-12-04 ENCOUNTER — Encounter: Payer: Self-pay | Admitting: Physician Assistant

## 2013-12-04 VITALS — BP 140/68 | HR 93 | Ht 65.0 in | Wt 216.0 lb

## 2013-12-04 DIAGNOSIS — I739 Peripheral vascular disease, unspecified: Secondary | ICD-10-CM | POA: Diagnosis not present

## 2013-12-04 DIAGNOSIS — Z951 Presence of aortocoronary bypass graft: Secondary | ICD-10-CM | POA: Diagnosis not present

## 2013-12-04 DIAGNOSIS — I209 Angina pectoris, unspecified: Secondary | ICD-10-CM

## 2013-12-04 DIAGNOSIS — I2510 Atherosclerotic heart disease of native coronary artery without angina pectoris: Secondary | ICD-10-CM | POA: Insufficient documentation

## 2013-12-04 DIAGNOSIS — I6529 Occlusion and stenosis of unspecified carotid artery: Secondary | ICD-10-CM | POA: Diagnosis present

## 2013-12-04 DIAGNOSIS — I208 Other forms of angina pectoris: Secondary | ICD-10-CM

## 2013-12-04 DIAGNOSIS — E119 Type 2 diabetes mellitus without complications: Secondary | ICD-10-CM

## 2013-12-04 DIAGNOSIS — E785 Hyperlipidemia, unspecified: Secondary | ICD-10-CM | POA: Diagnosis not present

## 2013-12-04 DIAGNOSIS — I2089 Other forms of angina pectoris: Secondary | ICD-10-CM

## 2013-12-04 DIAGNOSIS — I779 Disorder of arteries and arterioles, unspecified: Secondary | ICD-10-CM

## 2013-12-04 DIAGNOSIS — I10 Essential (primary) hypertension: Secondary | ICD-10-CM

## 2013-12-04 LAB — PROTIME-INR
INR: 1 ratio (ref 0.8–1.0)
Prothrombin Time: 10.9 s (ref 9.6–13.1)

## 2013-12-04 LAB — CBC
HCT: 38.1 % (ref 36.0–46.0)
Hemoglobin: 12.9 g/dL (ref 12.0–15.0)
MCHC: 33.8 g/dL (ref 30.0–36.0)
MCV: 86.8 fl (ref 78.0–100.0)
Platelets: 173 10*3/uL (ref 150.0–400.0)
RBC: 4.39 Mil/uL (ref 3.87–5.11)
RDW: 13.8 % (ref 11.5–15.5)
WBC: 8.1 10*3/uL (ref 4.0–10.5)

## 2013-12-04 MED ORDER — METOPROLOL SUCCINATE ER 50 MG PO TB24: 50.0000 mg | ORAL_TABLET | Freq: Two times a day (BID) | ORAL | Status: DC

## 2013-12-04 MED ORDER — ISOSORBIDE MONONITRATE ER 30 MG PO TB24: 120.0000 mg | ORAL_TABLET | Freq: Every day | ORAL | Status: DC

## 2013-12-04 NOTE — Progress Notes (Signed)
 Cardiology Office Note    Date:  12/04/2013   ID:  [name], DOB 08/27/1945, MRN [id number]  PCP:  [name],[name]  Cardiologist:  Dr. James Hochrein     History of Present Illness: [name] is a 68 y.o. female with a hx of CAD s/p CABG x 3 in 2012 with poor target vessels for grafting due to distal disease, IDDM, HL (statin intol), ASA allergy, carotid stenosis.  Last cath in 2013 with 2/3 grafts patent and medical Rx continued.  Patient has been followed by Kristy Gerhardt, NP closely.  Patient had stopped all medications earlier in the year for unclear reasons.  Husband recently died.  Medications were resumed.  She had chest pain that improved back on medical Rx.    Recently seen by Kristy Simmons, PA-C 9/2.  She presented with recurrent anginal symptoms. Patient had called the office prior to this appointment and was told to increase her isosorbide from 60-90 mg daily. She had not yet done this. Ms. [name] recommended increasing the isosorbide as previously recommended. She also discussed adjusting her beta blocker or adding Ranexa. The patient was hesitant to make further adjustments.  She returns for further evaluation of chest discomfort. She saw her primary care doctor yesterday.  ECG was reportedly normal. Patient refused repeat EKG today. She has increased her isosorbide to 120 mg daily. She notes increasing frequency in her angina over the past 3 months. She describes CCS class III-IV symptoms. She notes dyspnea with minimal activity. She also notes fatigue. She denies syncope, orthopnea. She denies significant pedal edema.   Studies:  - LHC (9/13):  Dist LM 30%, prox LAD 70%, mid LAD occl, ostial CFX 50%, ostial OM1 occl, mid CFX 70-80%, prox OM2 90%, mid RCA occl, S-Dx occl, S-OM1 patent with 40% mid - target vessel small with diffuse disz, L-LAD patent with diff disz beyond graft insertion, EF 45-50% - no targets for PCI >> Med Rx   - Carotid US (3/15):  R  60-79%, L 40-59% >> FU 6 mos   Recent Labs/Images:  No results found for requested labs within last 365 days.    Wt Readings from Last 3 Encounters:  12/04/13 216 lb (97.977 kg)  11/13/13 216 lb (97.977 kg)  09/04/13 213 lb 1.9 oz (96.671 kg)     Past Medical History  Diagnosis Date  . CAD (coronary artery disease)     S/P CABG January 2012; had poor target vessels for grafting.  A cath  9/13, LAD 100%, diagonal occluded, diffuse second marginal 90% stenosis, first obtuse marginal occluded, nondominant right coronary occluded, saphenous vein graft to the diagonal occluded, saphenous vein graft to OM 40% stenosis, LIMA to the LAD patent   . S/P cardiac catheterization 03/2010    EF 50%   . Orthostatic hypotension     previous Midodrine therapy  . DM (diabetes mellitus)   . Obesity   . HLD (hyperlipidemia)     statin intolerant  . Aspirin allergy   . PVD (peripheral vascular disease)     prior balloon PCI to left leg in [place]    Current Outpatient Prescriptions  Medication Sig Dispense Refill  . acetaminophen (TYLENOL) 325 MG tablet Take 650 mg by mouth as needed. For pain      . amLODipine (NORVASC) 2.5 MG tablet Take 2.5 mg by mouth daily.      . clopidogrel (PLAVIX) 75 MG tablet Take 1 tablet (75 mg total) by mouth daily.    90 tablet  3  . furosemide (LASIX) 20 MG tablet Take 1 tablet (20 mg total) by mouth as needed.  30 tablet  6  . glimepiride (AMARYL) 1 MG tablet Take 1 mg by mouth 2 (two) times daily.       . insulin glargine (LANTUS) 100 UNIT/ML injection Inject 32 Units into the skin at bedtime. Based on sugar levels      . isosorbide mononitrate (IMDUR) 30 MG 24 hr tablet Take 3 tablets (90 mg total) by mouth daily.  270 tablet  3  . metoprolol succinate (TOPROL-XL) 50 MG 24 hr tablet Take 1 tablet (50 mg total) by mouth daily. Take with or immediately following a meal.  90 tablet  3  . NITROSTAT 0.4 MG SL tablet PLACE 1 TABLET UNDER THE TONGUE EVERY 5 MINUTES FOR  UP TO 3 DOSES AS NEEDED FOR CHEST PAINS, IF NO RELIEF CALL 911  25 tablet  4  . TRUETEST TEST test strip 1 each by Other route as needed.        No current facility-administered medications for this visit.     Allergies:   Bee venom; Aspirin; Atorvastatin; Codeine; Cortisone; Demerol; Meperidine; Meperidine hcl; Metformin; and Prednisone   Social History:  The patient  reports that she has never smoked. She has never used smokeless tobacco. She reports that she does not drink alcohol or use illicit drugs.   Family History:  The patient's family history includes Esophageal cancer in her mother; Heart attack in her father; Stroke in her mother.   ROS:  Please see the history of present illness.   She has a non-productive cough.   All other systems reviewed and negative.    PHYSICAL EXAM: VS:  BP 140/68  Pulse 93  Ht 5\' 5"  (1.651 m)  Wt 216 lb (97.977 kg)  BMI 35.94 kg/m2 Well nourished, well developed, in no acute distress HEENT: normal Neck: no JVD Cardiac:  normal S1, S2; RRR; no murmur Lungs:  clear to auscultation bilaterally, no wheezing, rhonchi or rales Abd: soft, nontender, no hepatomegaly Ext: no edema Skin: warm and dry Neuro:  CNs 2-12 intact, no focal abnormalities noted      ASSESSMENT AND PLAN:  1. Exertional angina:  As noted, the patient is having CCS class 3-4 angina. Her symptoms have become more frequent over the last 3 months. She is failing medical therapy. She has distal vessel disease and likely does not have any targets for PCI. However, given the change in her symptoms, it is worth proceeding with cardiac catheterization to further evaluate. We may be able to identify an area that would be amenable to PCI. I reviewed this with Dr. Everette Rank (DOD) who agreed.  Risks and benefits of cardiac catheterization have been discussed with the patient.  These include bleeding, infection, kidney damage, stroke, heart attack, death.  The patient understands these risks  and is willing to proceed. Increase Toprol-XL to 50 mg BID.  Continue current dose of nitrates, amlodipine.   2. CAD: Proceed with cardiac catheterization as noted. Continue Plavix, beta blocker. She is intolerant to statins. 3. Essential hypertension:  Blood pressure elevated. Adjust medications as noted. 4. CAROTID ARTERY STENOSIS, BILATERAL: Follow up carotid US scheduled for today. 5. HYPERLIPIDEMIA: She is intolerant to statins. Most recent LDL in the 180 range. Refer to the lipid clinic to see if she is a candidate for PCSK-9 trial. 6. DM:  Recent A1c 7.7.  FU with PCP for management.  Hold Amaryl morning of cath.  Take 1/2 dose of Lantus the night before her cath.  Disposition:    FU with Dr. Rollene Rotunda or me after cath.    Signed, Brynda Rim, MHS 12/04/2013 2:26 PM    [facility] [address] [place], [place], [place]  [address] Phone: [phone]; Fax: [phone]

## 2013-12-04 NOTE — H&P (Signed)
History and Physical    Date:  12/04/2013   ID:  [name], DOB 1945/12/12, MRN [id number]  PCP:  [name]  Cardiologist:  Dr. Rollene Rotunda     History of Present Illness: [name] is a 68 y.o. female with a hx of CAD s/p CABG x 3 in 2012 with poor target vessels for grafting due to distal disease, IDDM, HL (statin intol), ASA allergy, carotid stenosis.  Last cath in 2013 with 2/3 grafts patent and medical Rx continued.  Patient has been followed by [name], NP closely.  Patient had stopped all medications earlier in the year for unclear reasons.  Husband recently died.  Medications were resumed.  She had chest pain that improved back on medical Rx.    Recently seen by [name], PA-C 9/2.  She presented with recurrent anginal symptoms. Patient had called the office prior to this appointment and was told to increase her isosorbide from 60-90 mg daily. She had not yet done this. [name] recommended increasing the isosorbide as previously recommended. She also discussed adjusting her beta blocker or adding Ranexa. The patient was hesitant to make further adjustments.  She returns for further evaluation of chest discomfort. She saw her primary care doctor yesterday.  ECG was reportedly normal. Patient refused repeat EKG today. She has increased her isosorbide to 120 mg daily. She notes increasing frequency in her angina over the past 3 months. She describes CCS class III-IV symptoms. She notes dyspnea with minimal activity. She also notes fatigue. She denies syncope, orthopnea. She denies significant pedal edema.   Studies:  - LHC (9/13):  Dist LM 30%, prox LAD 70%, mid LAD occl, ostial CFX 50%, ostial OM1 occl, mid CFX 70-80%, prox OM2 90%, mid RCA occl, S-Dx occl, S-OM1 patent with 40% mid - target vessel small with diffuse disz, L-LAD patent with diff disz beyond graft insertion, EF 45-50% - no targets for PCI >> Med Rx   - Carotid US (3/15):  R 60-79%,  L 40-59% >> FU 6 mos   Recent Labs/Images:  No results found for requested labs within last 365 days.    Wt Readings from Last 3 Encounters:  12/04/13 216 lb (97.977 kg)  11/13/13 216 lb (97.977 kg)  09/04/13 213 lb 1.9 oz (96.671 kg)     Past Medical History  Diagnosis Date  . CAD (coronary artery disease)     S/P CABG January 2012; had poor target vessels for grafting.  A cath  9/13, LAD 100%, diagonal occluded, diffuse second marginal 90% stenosis, first obtuse marginal occluded, nondominant right coronary occluded, saphenous vein graft to the diagonal occluded, saphenous vein graft to OM 40% stenosis, LIMA to the LAD patent   . S/P cardiac catheterization 03/2010    EF 50%   . Orthostatic hypotension     previous Midodrine therapy  . DM (diabetes mellitus)   . Obesity   . HLD (hyperlipidemia)     statin intolerant  . Aspirin allergy   . PVD (peripheral vascular disease)     prior balloon PCI to left leg in [place]    Current Outpatient Prescriptions  Medication Sig Dispense Refill  . acetaminophen (TYLENOL) 325 MG tablet Take 650 mg by mouth as needed. For pain      . amLODipine (NORVASC) 2.5 MG tablet Take 2.5 mg by mouth daily.      . clopidogrel (PLAVIX) 75 MG tablet Take 1 tablet (75 mg total) by mouth daily.  90 tablet  3  . furosemide (LASIX) 20 MG tablet Take 1 tablet (20 mg total) by mouth as needed.  30 tablet  6  . glimepiride (AMARYL) 1 MG tablet Take 1 mg by mouth 2 (two) times daily.       . insulin glargine (LANTUS) 100 UNIT/ML injection Inject 32 Units into the skin at bedtime. Based on sugar levels      . isosorbide mononitrate (IMDUR) 30 MG 24 hr tablet Take 3 tablets (90 mg total) by mouth daily.  270 tablet  3  . metoprolol succinate (TOPROL-XL) 50 MG 24 hr tablet Take 1 tablet (50 mg total) by mouth daily. Take with or immediately following a meal.  90 tablet  3  . NITROSTAT 0.4 MG SL tablet PLACE 1 TABLET UNDER THE TONGUE EVERY 5 MINUTES FOR UP TO 3  DOSES AS NEEDED FOR CHEST PAINS, IF NO RELIEF CALL 911  25 tablet  4  . TRUETEST TEST test strip 1 each by Other route as needed.        No current facility-administered medications for this visit.     Allergies:   Bee venom; Aspirin; Atorvastatin; Codeine; Cortisone; Demerol; Meperidine; Meperidine hcl; Metformin; and Prednisone   Social History:  The patient  reports that she has never smoked. She has never used smokeless tobacco. She reports that she does not drink alcohol or use illicit drugs.   Family History:  The patient's family history includes Esophageal cancer in her mother; Heart attack in her father; Stroke in her mother.   ROS:  Please see the history of present illness.   She has a non-productive cough.   All other systems reviewed and negative.    PHYSICAL EXAM: VS:  BP 140/68  Pulse 93  Ht  (1.651 m)  Wt 216 lb (97.977 kg)  BMI 35.94 kg/m2 Well nourished, well developed, in no acute distress HEENT: normal Neck: no JVD Cardiac:  normal S1, S2; RRR; no murmur Lungs:  clear to auscultation bilaterally, no wheezing, rhonchi or rales Abd: soft, nontender, no hepatomegaly Ext: no edema Skin: warm and dry Neuro:  CNs 2-12 intact, no focal abnormalities noted      ASSESSMENT AND PLAN:  1. Exertional angina:  As noted, the patient is having CCS class 3-4 angina. Her symptoms have become more frequent over the last 3 months. She is failing medical therapy. She has distal vessel disease and likely does not have any targets for PCI. However, given the change in her symptoms, it is worth proceeding with cardiac catheterization to further evaluate. We may be able to identify an area that would be amenable to PCI. I reviewed this with Dr. Everette Rank (DOD) who agreed.  Risks and benefits of cardiac catheterization have been discussed with the patient.  These include bleeding, infection, kidney damage, stroke, heart attack, death.  The patient understands these risks and is  willing to proceed. Increase Toprol-XL to 50 mg BID.  Continue current dose of nitrates, amlodipine.   2. CAD: Proceed with cardiac catheterization as noted. Continue Plavix, beta blocker. She is intolerant to statins. 3. Essential hypertension:  Blood pressure elevated. Adjust medications as noted. 4. CAROTID ARTERY STENOSIS, BILATERAL: Follow up carotid US scheduled for today. 5. HYPERLIPIDEMIA: She is intolerant to statins. Most recent LDL in the 180 range. Refer to the lipid clinic to see if she is a candidate for PCSK-9 trial. 6. DM:  Recent A1c 7.7.  FU with PCP for management.  Hold Amaryl morning of cath.  Take 1/2 dose of Lantus the night before her cath.  Disposition:    FU with Dr. Rollene Rotunda or me after cath.    Signed, Brynda Rim, MHS 12/04/2013 2:26 PM    [facility] [address] [place], [place], [place]  [address] Phone: [phone]; Fax: [phone]

## 2013-12-04 NOTE — Progress Notes (Signed)
Carotid duplex complete 

## 2013-12-04 NOTE — Patient Instructions (Signed)
Your physician recommends that you schedule a follow-up appointment with Dr. Antoine Poche --2-3 weeks after cath  You have been referred to  The lipid clinic in our office. Please schedule new patient appt for patient.   Your physician has recommended you make the following change in your medication:  Continue increased dose of Imdur 120 mg by mouth daily. Increase Toprol to 50 mg by mouth twice daily.   Your physician has requested that you have a cardiac catheterization. Cardiac catheterization is used to diagnose and/or treat various heart conditions. Doctors may recommend this procedure for a number of different reasons. The most common reason is to evaluate chest pain. Chest pain can be a symptom of coronary artery disease (CAD), and cardiac catheterization can show whether plaque is narrowing or blocking your heart's arteries. This procedure is also used to evaluate the valves, as well as measure the blood flow and oxygen levels in different parts of your heart. For further information please visit [email or web address] Please follow instruction sheet, as given. Scheduled for December 09, 2013

## 2013-12-05 ENCOUNTER — Telehealth: Payer: Self-pay | Admitting: *Deleted

## 2013-12-05 NOTE — Telephone Encounter (Signed)
pt notified about lab results ok for cath, pt verbalized understanding to these results

## 2013-12-06 ENCOUNTER — Encounter (HOSPITAL_COMMUNITY): Payer: Self-pay | Admitting: Pharmacy Technician

## 2013-12-09 ENCOUNTER — Encounter (HOSPITAL_COMMUNITY)
Admission: RE | Disposition: A | Discharge status: Home / Self Care | Payer: Self-pay | Source: Ambulatory Visit | Attending: Interventional Cardiology

## 2013-12-09 ENCOUNTER — Encounter (HOSPITAL_COMMUNITY): Payer: Self-pay | Admitting: General Practice

## 2013-12-09 ENCOUNTER — Ambulatory Visit (HOSPITAL_COMMUNITY)
Admission: RE | Admit: 2013-12-09 | Discharge: 2013-12-10 | Disposition: A | Discharge status: Home / Self Care | Payer: Medicare HMO | Source: Ambulatory Visit | Attending: Interventional Cardiology | Admitting: Interventional Cardiology

## 2013-12-09 DIAGNOSIS — Z794 Long term (current) use of insulin: Secondary | ICD-10-CM | POA: Insufficient documentation

## 2013-12-09 DIAGNOSIS — I208 Other forms of angina pectoris: Secondary | ICD-10-CM

## 2013-12-09 DIAGNOSIS — E119 Type 2 diabetes mellitus without complications: Secondary | ICD-10-CM

## 2013-12-09 DIAGNOSIS — E785 Hyperlipidemia, unspecified: Secondary | ICD-10-CM | POA: Diagnosis not present

## 2013-12-09 DIAGNOSIS — Z79899 Other long term (current) drug therapy: Secondary | ICD-10-CM | POA: Insufficient documentation

## 2013-12-09 DIAGNOSIS — Z7902 Long term (current) use of antithrombotics/antiplatelets: Secondary | ICD-10-CM | POA: Insufficient documentation

## 2013-12-09 DIAGNOSIS — Z23 Encounter for immunization: Secondary | ICD-10-CM | POA: Insufficient documentation

## 2013-12-09 DIAGNOSIS — Z6835 Body mass index (BMI) 35.0-35.9, adult: Secondary | ICD-10-CM | POA: Insufficient documentation

## 2013-12-09 DIAGNOSIS — E669 Obesity, unspecified: Secondary | ICD-10-CM | POA: Diagnosis not present

## 2013-12-09 DIAGNOSIS — Z955 Presence of coronary angioplasty implant and graft: Secondary | ICD-10-CM

## 2013-12-09 DIAGNOSIS — I25810 Atherosclerosis of coronary artery bypass graft(s) without angina pectoris: Secondary | ICD-10-CM | POA: Diagnosis not present

## 2013-12-09 DIAGNOSIS — I2510 Atherosclerotic heart disease of native coronary artery without angina pectoris: Secondary | ICD-10-CM | POA: Insufficient documentation

## 2013-12-09 DIAGNOSIS — I2582 Chronic total occlusion of coronary artery: Secondary | ICD-10-CM | POA: Diagnosis not present

## 2013-12-09 DIAGNOSIS — I209 Angina pectoris, unspecified: Secondary | ICD-10-CM | POA: Diagnosis present

## 2013-12-09 HISTORY — PX: CORONARY ANGIOPLASTY WITH STENT PLACEMENT: SHX49

## 2013-12-09 HISTORY — DX: Pneumonia, unspecified organism: J18.9

## 2013-12-09 HISTORY — DX: Acute embolism and thrombosis of unspecified deep veins of unspecified lower extremity: I82.409

## 2013-12-09 HISTORY — DX: Type 2 diabetes mellitus without complications: E11.9

## 2013-12-09 HISTORY — PX: LEFT HEART CATHETERIZATION WITH CORONARY/GRAFT ANGIOGRAM: SHX5450

## 2013-12-09 HISTORY — PX: PERCUTANEOUS CORONARY STENT INTERVENTION (PCI-S): SHX5485

## 2013-12-09 HISTORY — DX: Unspecified osteoarthritis, unspecified site: M19.90

## 2013-12-09 LAB — GLUCOSE, CAPILLARY
Glucose-Capillary: 151 mg/dL — ABNORMAL HIGH (ref 70–99)
Glucose-Capillary: 121 mg/dL — ABNORMAL HIGH (ref 70–99)
Glucose-Capillary: 91 mg/dL (ref 70–99)
Glucose-Capillary: 181 mg/dL — ABNORMAL HIGH (ref 70–99)

## 2013-12-09 LAB — BASIC METABOLIC PANEL
Anion gap: 15 (ref 5–15)
BUN: 19 mg/dL (ref 6–23)
CO2: 22 mEq/L (ref 19–32)
Calcium: 9.1 mg/dL (ref 8.4–10.5)
Chloride: 103 mEq/L (ref 96–112)
Creatinine, Ser: 0.65 mg/dL (ref 0.50–1.10)
GFR calc Af Amer: >90 mL/min (ref >90)
GFR calc non Af Amer: 88 mL/min — ABNORMAL LOW (ref >90)
Glucose, Bld: 159 mg/dL — ABNORMAL HIGH (ref 70–99)
Potassium: 4.1 mEq/L (ref 3.7–5.3)
Sodium: 140 mEq/L (ref 137–147)

## 2013-12-09 LAB — POCT ACTIVATED CLOTTING TIME
Activated Clotting Time: 225 seconds
Activated Clotting Time: 253 seconds
Activated Clotting Time: 258 seconds
Activated Clotting Time: 253 seconds

## 2013-12-09 SURGERY — LEFT HEART CATHETERIZATION WITH CORONARY/GRAFT ANGIOGRAM
Anesthesia: LOCAL

## 2013-12-09 MED ORDER — HEPARIN (PORCINE) IN NACL 2-0.9 UNIT/ML-% IJ SOLN
INTRAMUSCULAR | Status: AC
  Filled 2013-12-09: qty 1000

## 2013-12-09 MED ORDER — ONDANSETRON HCL 4 MG/2ML IJ SOLN
4.0000 mg | Freq: Four times a day (QID) | INTRAMUSCULAR | Status: DC | PRN
Start: 2013-12-09 — End: 2013-12-10

## 2013-12-09 MED ORDER — GUAIFENESIN 100 MG/5ML PO SYRP
200.0000 mg | ORAL_SOLUTION | ORAL | Status: DC | PRN
  Filled 2013-12-09: qty 10

## 2013-12-09 MED ORDER — INSULIN GLARGINE 100 UNIT/ML ~~LOC~~ SOLN
20.0000 [IU] | Freq: Every day | SUBCUTANEOUS | Status: DC
  Administered 2013-12-09: 22:00:00 20 [IU] via SUBCUTANEOUS
  Filled 2013-12-09 (×2): qty 0.2

## 2013-12-09 MED ORDER — SODIUM CHLORIDE 0.9 % IV SOLN: INTRAVENOUS | Status: AC

## 2013-12-09 MED ORDER — INFLUENZA VAC SPLIT QUAD 0.5 ML IM SUSY
0.5000 mL | PREFILLED_SYRINGE | INTRAMUSCULAR | Status: AC
  Administered 2013-12-10: 0.5 mL via INTRAMUSCULAR
  Filled 2013-12-09: qty 0.5

## 2013-12-09 MED ORDER — ISOSORBIDE MONONITRATE ER 60 MG PO TB24
120.0000 mg | ORAL_TABLET | Freq: Every evening | ORAL | Status: DC
  Administered 2013-12-09: 20:00:00 120 mg via ORAL
  Filled 2013-12-09 (×2): qty 2

## 2013-12-09 MED ORDER — INSULIN GLARGINE 100 UNIT/ML ~~LOC~~ SOLN: 25.0000 [IU] | Freq: Every day | SUBCUTANEOUS | Status: DC

## 2013-12-09 MED ORDER — ISOSORBIDE MONONITRATE ER 30 MG PO TB24: 30.0000 mg | ORAL_TABLET | Freq: Two times a day (BID) | ORAL | Status: DC

## 2013-12-09 MED ORDER — CLOPIDOGREL BISULFATE 75 MG PO TABS
75.0000 mg | ORAL_TABLET | ORAL | Status: DC
Start: 2013-12-09 — End: 2013-12-09

## 2013-12-09 MED ORDER — ACETAMINOPHEN ER 650 MG PO TBCR: 1300.0000 mg | EXTENDED_RELEASE_TABLET | Freq: Three times a day (TID) | ORAL | Status: DC | PRN

## 2013-12-09 MED ORDER — SODIUM CHLORIDE 0.9 % IV SOLN: 250.0000 mL | INTRAVENOUS | Status: DC | PRN

## 2013-12-09 MED ORDER — MIDAZOLAM HCL 2 MG/2ML IJ SOLN
INTRAMUSCULAR | Status: AC
  Filled 2013-12-09: qty 2

## 2013-12-09 MED ORDER — SODIUM CHLORIDE 0.9 % IJ SOLN: 3.0000 mL | INTRAMUSCULAR | Status: DC | PRN

## 2013-12-09 MED ORDER — HEPARIN SODIUM (PORCINE) 1000 UNIT/ML IJ SOLN
INTRAMUSCULAR | Status: AC
  Filled 2013-12-09: qty 1

## 2013-12-09 MED ORDER — CLOPIDOGREL BISULFATE 75 MG PO TABS: 75.0000 mg | ORAL_TABLET | Freq: Every day | ORAL | Status: DC

## 2013-12-09 MED ORDER — VERAPAMIL HCL 2.5 MG/ML IV SOLN
INTRAVENOUS | Status: AC
  Filled 2013-12-09: qty 2

## 2013-12-09 MED ORDER — CLOPIDOGREL BISULFATE 300 MG PO TABS
ORAL_TABLET | ORAL | Status: AC
  Filled 2013-12-09: qty 1

## 2013-12-09 MED ORDER — ISOSORBIDE MONONITRATE ER 30 MG PO TB24
30.0000 mg | ORAL_TABLET | Freq: Every morning | ORAL | Status: DC
Start: 2013-12-10 — End: 2013-12-10
  Administered 2013-12-10: 10:00:00 30 mg via ORAL
  Filled 2013-12-09: qty 1

## 2013-12-09 MED ORDER — NITROGLYCERIN 1 MG/10 ML FOR IR/CATH LAB
INTRA_ARTERIAL | Status: AC
  Filled 2013-12-09: qty 10

## 2013-12-09 MED ORDER — GLIMEPIRIDE 2 MG PO TABS
2.0000 mg | ORAL_TABLET | Freq: Two times a day (BID) | ORAL | Status: DC
  Administered 2013-12-09 – 2013-12-10 (×2): 2 mg via ORAL
  Filled 2013-12-09 (×3): qty 1

## 2013-12-09 MED ORDER — ACETAMINOPHEN 325 MG PO TABS
650.0000 mg | ORAL_TABLET | ORAL | Status: DC | PRN
Start: 2013-12-09 — End: 2013-12-10

## 2013-12-09 MED ORDER — SODIUM CHLORIDE 0.9 % IV SOLN
INTRAVENOUS | Status: DC
  Administered 2013-12-09: 07:00:00 via INTRAVENOUS

## 2013-12-09 MED ORDER — NITROGLYCERIN 0.4 MG SL SUBL
0.4000 mg | SUBLINGUAL_TABLET | SUBLINGUAL | Status: DC | PRN
Start: 2013-12-09 — End: 2013-12-10

## 2013-12-09 MED ORDER — METOPROLOL SUCCINATE ER 50 MG PO TB24
50.0000 mg | ORAL_TABLET | Freq: Two times a day (BID) | ORAL | Status: DC
  Administered 2013-12-09 – 2013-12-10 (×3): 50 mg via ORAL
  Filled 2013-12-09 (×4): qty 1

## 2013-12-09 MED ORDER — CLOPIDOGREL BISULFATE 75 MG PO TABS
75.0000 mg | ORAL_TABLET | Freq: Every day | ORAL | Status: DC
  Administered 2013-12-10: 10:00:00 75 mg via ORAL
  Filled 2013-12-09: qty 1

## 2013-12-09 MED ORDER — FENTANYL CITRATE 0.05 MG/ML IJ SOLN
INTRAMUSCULAR | Status: AC
  Filled 2013-12-09: qty 2

## 2013-12-09 MED ORDER — SODIUM CHLORIDE 0.9 % IJ SOLN: 3.0000 mL | Freq: Two times a day (BID) | INTRAMUSCULAR | Status: DC

## 2013-12-09 MED ORDER — AMLODIPINE BESYLATE 2.5 MG PO TABS
2.5000 mg | ORAL_TABLET | Freq: Two times a day (BID) | ORAL | Status: DC
  Administered 2013-12-09 – 2013-12-10 (×3): 2.5 mg via ORAL
  Filled 2013-12-09 (×4): qty 1

## 2013-12-09 MED ORDER — LIDOCAINE HCL (PF) 1 % IJ SOLN
INTRAMUSCULAR | Status: AC
  Filled 2013-12-09: qty 30

## 2013-12-09 NOTE — Progress Notes (Signed)
TR BAND REMOVAL  LOCATION:  left radial  DEFLATED PER PROTOCOL:  Yes.    TIME BAND OFF / DRESSING APPLIED:   1345   SITE UPON ARRIVAL:   Level 0  SITE AFTER BAND REMOVAL:  Level 0  REVERSE ALLEN'S TEST:    positive  CIRCULATION SENSATION AND MOVEMENT:  Within Normal Limits  Yes.    COMMENTS:

## 2013-12-09 NOTE — Interval H&P Note (Signed)
Cath Lab Visit (complete for each Cath Lab visit)  Clinical Evaluation Leading to the Procedure:   ACS: No.  Non-ACS:    Anginal Classification: CCS III  Anti-ischemic medical therapy: Maximal Therapy (2 or more classes of medications)  Non-Invasive Test Results: No non-invasive testing performed  Prior CABG: Previous CABG      History and Physical Interval Note:  12/09/2013 7:53 AM  [name]  has presented today for surgery, with the diagnosis of cp  The various methods of treatment have been discussed with the patient and family. After consideration of risks, benefits and other options for treatment, the patient has consented to  Procedure(s): LEFT HEART CATHETERIZATION WITH CORONARY/GRAFT ANGIOGRAM (N/A) as a surgical intervention .  The patient's history has been reviewed, patient examined, no change in status, stable for surgery.  I have reviewed the patient's chart and labs.  Questions were answered to the patient's satisfaction.     VARANASI,JAYADEEP S.

## 2013-12-09 NOTE — CV Procedure (Signed)
PROCEDURE:  Left heart catheterization, bypass graft angiography with selective coronary angiography, PCI Left circumflex  INDICATIONS:  Worsening angina  The risks, benefits, and details of the procedure were explained to the patient.  The patient verbalized understanding and wanted to proceed.  Informed written consent was obtained.  PROCEDURE TECHNIQUE:  After Xylocaine anesthesia a 47F slender sheath was placed in the right radial artery with a single anterior needle wall stick.   Right coronary angiography was done using a Judkins R4 guide catheter.  Vein graft angiography was performed using a JR 4. Left coronary angiography was done using a EBU 3 guide catheter.  Left heart catheterization was done using the EBU 3. The intervention was performed. Please see below for details.  A TR band was used for hemostasis.   CONTRAST:  Total of 255 cc.  COMPLICATIONS:  None.    HEMODYNAMICS:  Aortic pressure was 163/80; LV pressure was 162/18; LVEDP 22.  There was no gradient between the left ventricle and aorta.    ANGIOGRAPHIC DATA:   The left main coronary artery is has mild distal disease.  The left anterior descending artery is heavily diseased from the proximal vessel. The mid vessel is occluded. There is a medium-sized diagonal vessel which has severe ostial disease.  The SVG to diagonal is occluded.  The LIMA to LAD is widely patent. There are collaterals from the LIMA to the circumflex system as well as the RCA system.  Moderate, mid to distal LAD disease.  The left circumflex artery is a large vessel. There is mild ostial disease in the circumflex. There is mild diffuse disease in the mid vessel. Just before the second obtuse marginal, there is a focal 90% lesion. The second obtuse marginal has severe disease in the ostial to proximal vessel, and is subtotally occluded. The remainder of the circumflex has mild luminal irregularities.     The SVG to OM is patent. There is no  flow back into the main circumflex system.  There is a moderate mid lesion which appears unchanged from prior cath.    The right coronary artery is occluded in the midportion. There are ipsilateral right to right collaterals. There are left to right collaterals from the LAD.  LEFT VENTRICULOGRAM:  Left ventricular angiogram was not done. LVEDP was 20 mmHg.  PCI NARRATIVE: IV heparin was used for anticoagulation. An ACT was used to check that the heparin was therapeutic. After trying several guides, and EBU 3.0 guiding catheters used for the intervention. A pro-water wire was placed across the area disease in the circumflex. For additional support a BMW wire was placed in the circumflex. The lesion in the mid vessel was dilated with a 2.5 x 12 balloon. A 2.75 x 16 balloon was then used to stent. There was an irregularity at the proximal edge of the stent. A 2.75 x 8 stent was then deployed in overlapping fashion across the proximal edge. The entire stented area was post dilated with a 3.0 x 12 noncompliant balloon. Intra-coronary nitroglycerin was given. There was an excellent angiographic result.  IMPRESSIONS:  1. Patent left main coronary artery. 2. Heavily diseased native left anterior descending artery and its branches.  LIMA to LAD is patent. SVG to diagonal is occluded. 3. 90% lesion in the mid left circumflex artery.  Patent SVG to OM, but due to proximal disease in the OM, there is no flow into the distal circumflex system from the bypass graft. The native  circumflex was stented 2.75 x 16 overlapping 2.75 x 8 promus drug-eluting stents, postdilated to greater than 3 mm. 4. Chronically occluded right coronary artery.  5. Left ventricular systolic function not assessed.  LVEDP 20 mmHg.    RECOMMENDATION:  Continue dual antiplatelet therapy for at least a year. She'll be watched overnight. Continue aggressive secondary prevention including diabetes control.Marland Kitchen

## 2013-12-09 NOTE — H&P (View-Only) (Signed)
Cardiology Office Note    Date:  12/04/2013   ID:  [name], DOB [birth year], MRN [id number]  PCP:  [name]  Cardiologist:  Dr. Rollene [name]     History of Present Illness: [name] is a 68 y.o. female with a hx of CAD s/p CABG x 3 in 2012 with poor target vessels for grafting due to distal disease, IDDM, HL (statin intol), ASA allergy, carotid stenosis.  Last cath in 2013 with 2/3 grafts patent and medical Rx continued.  Patient has been followed by Kristy Fredrickson, NP closely.  Patient had stopped all medications earlier in the year for unclear reasons.  Husband recently died.  Medications were resumed.  She had chest pain that improved back on medical Rx.    Recently seen by [name] 9/2.  She presented with recurrent anginal symptoms. Patient had called the office prior to this appointment and was told to increase her isosorbide from 60-90 mg daily. She had not yet done this. [name] recommended increasing the isosorbide as previously recommended. She also discussed adjusting her beta blocker or adding Ranexa. The patient was hesitant to make further adjustments.  She returns for further evaluation of chest discomfort. She saw her primary care doctor yesterday.  ECG was reportedly normal. Patient refused repeat EKG today. She has increased her isosorbide to 120 mg daily. She notes increasing frequency in her angina over the past 3 months. She describes CCS class III-IV symptoms. She notes dyspnea with minimal activity. She also notes fatigue. She denies syncope, orthopnea. She denies significant pedal edema.   Studies:  - LHC (9/13):  Dist LM 30%, prox LAD 70%, mid LAD occl, ostial CFX 50%, ostial OM1 occl, mid CFX 70-80%, prox OM2 90%, mid RCA occl, S-Dx occl, S-OM1 patent with 40% mid - target vessel small with diffuse disz, L-LAD patent with diff disz beyond graft insertion, EF 45-50% - no targets for PCI >> Med Rx   - Carotid US (3/15):  R  60-79%, L 40-59% >> FU 6 mos   Recent Labs/Images:  No results found for requested labs within last 365 days.    Wt Readings from Last 3 Encounters:  12/04/13 216 lb (97.977 kg)  11/13/13 216 lb (97.977 kg)  09/04/13 213 lb 1.9 oz (96.671 kg)     Past Medical History  Diagnosis Date  . CAD (coronary artery disease)     S/P CABG January 2012; had poor target vessels for grafting.  A cath  9/13, LAD 100%, diagonal occluded, diffuse second marginal 90% stenosis, first obtuse marginal occluded, nondominant right coronary occluded, saphenous vein graft to the diagonal occluded, saphenous vein graft to OM 40% stenosis, LIMA to the LAD patent   . S/P cardiac catheterization 03/2010    EF 50%   . Orthostatic hypotension     previous Midodrine therapy  . DM (diabetes mellitus)   . Obesity   . HLD (hyperlipidemia)     statin intolerant  . Aspirin allergy   . PVD (peripheral vascular disease)     prior balloon PCI to left leg in [place]    Current Outpatient Prescriptions  Medication Sig Dispense Refill  . acetaminophen (TYLENOL) 325 MG tablet Take 650 mg by mouth as needed. For pain      . amLODipine (NORVASC) 2.5 MG tablet Take 2.5 mg by mouth daily.      . clopidogrel (PLAVIX) 75 MG tablet Take 1 tablet (75 mg total) by mouth daily.  90 tablet  3  . furosemide (LASIX) 20 MG tablet Take 1 tablet (20 mg total) by mouth as needed.  30 tablet  6  . glimepiride (AMARYL) 1 MG tablet Take 1 mg by mouth 2 (two) times daily.       . insulin glargine (LANTUS) 100 UNIT/ML injection Inject 32 Units into the skin at bedtime. Based on sugar levels      . isosorbide mononitrate (IMDUR) 30 MG 24 hr tablet Take 3 tablets (90 mg total) by mouth daily.  270 tablet  3  . metoprolol succinate (TOPROL-XL) 50 MG 24 hr tablet Take 1 tablet (50 mg total) by mouth daily. Take with or immediately following a meal.  90 tablet  3  . NITROSTAT 0.4 MG SL tablet PLACE 1 TABLET UNDER THE TONGUE EVERY 5 MINUTES FOR  UP TO 3 DOSES AS NEEDED FOR CHEST PAINS, IF NO RELIEF CALL 911  25 tablet  4  . TRUETEST TEST test strip 1 each by Other route as needed.        No current facility-administered medications for this visit.     Allergies:   Bee venom; Aspirin; Atorvastatin; Codeine; Cortisone; Demerol; Meperidine; Meperidine hcl; Metformin; and Prednisone   Social History:  The patient  reports that she has never smoked. She has never used smokeless tobacco. She reports that she does not drink alcohol or use illicit drugs.   Family History:  The patient's family history includes Esophageal cancer in her mother; Heart attack in her father; Stroke in her mother.   ROS:  Please see the history of present illness.   She has a non-productive cough.   All other systems reviewed and negative.    PHYSICAL EXAM: VS:  BP 140/68  Pulse 93  Ht 5\' 5"  (1.651 m)  Wt 216 lb (97.977 kg)  BMI 35.94 kg/m2 Well nourished, well developed, in no acute distress HEENT: normal Neck: no JVD Cardiac:  normal S1, S2; RRR; no murmur Lungs:  clear to auscultation bilaterally, no wheezing, rhonchi or rales Abd: soft, nontender, no hepatomegaly Ext: no edema Skin: warm and dry Neuro:  CNs 2-12 intact, no focal abnormalities noted      ASSESSMENT AND PLAN:  1. Exertional angina:  As noted, the patient is having CCS class 3-4 angina. Her symptoms have become more frequent over the last 3 months. She is failing medical therapy. She has distal vessel disease and likely does not have any targets for PCI. However, given the change in her symptoms, it is worth proceeding with cardiac catheterization to further evaluate. We may be able to identify an area that would be amenable to PCI. I reviewed this with Dr. Everette [name] (DOD) who agreed.  Risks and benefits of cardiac catheterization have been discussed with the patient.  These include bleeding, infection, kidney damage, stroke, heart attack, death.  The patient understands these risks  and is willing to proceed. Increase Toprol-XL to 50 mg BID.  Continue current dose of nitrates, amlodipine.   2. CAD: Proceed with cardiac catheterization as noted. Continue Plavix, beta blocker. She is intolerant to statins. 3. Essential hypertension:  Blood pressure elevated. Adjust medications as noted. 4. CAROTID ARTERY STENOSIS, BILATERAL: Follow up carotid US scheduled for today. 5. HYPERLIPIDEMIA: She is intolerant to statins. Most recent LDL in the 180 range. Refer to the lipid clinic to see if she is a candidate for PCSK-9 trial. 6. DM:  Recent A1c 7.7.  FU with PCP for management.  Hold Amaryl morning of cath.  Take 1/2 dose of Lantus the night before her cath.  Disposition:    FU with Dr. Rollene [name] or me after cath.    Signed, Brynda Rim, MHS 12/04/2013 2:26 PM    [facility] [address] [place], [place], [place]  [address] Phone: [phone]; Fax: [phone]

## 2013-12-10 ENCOUNTER — Encounter (HOSPITAL_COMMUNITY): Payer: Self-pay | Admitting: Physician Assistant

## 2013-12-10 DIAGNOSIS — I2582 Chronic total occlusion of coronary artery: Secondary | ICD-10-CM | POA: Diagnosis not present

## 2013-12-10 DIAGNOSIS — I209 Angina pectoris, unspecified: Secondary | ICD-10-CM | POA: Diagnosis not present

## 2013-12-10 DIAGNOSIS — I25810 Atherosclerosis of coronary artery bypass graft(s) without angina pectoris: Secondary | ICD-10-CM | POA: Diagnosis not present

## 2013-12-10 DIAGNOSIS — I2510 Atherosclerotic heart disease of native coronary artery without angina pectoris: Secondary | ICD-10-CM | POA: Diagnosis not present

## 2013-12-10 LAB — CBC
HCT: 32.3 % — ABNORMAL LOW (ref 36.0–46.0)
Hemoglobin: 10.9 g/dL — ABNORMAL LOW (ref 12.0–15.0)
MCH: 28.9 pg (ref 26.0–34.0)
MCHC: 33.7 g/dL (ref 30.0–36.0)
MCV: 85.7 fL (ref 78.0–100.0)
Platelets: 182 10*3/uL (ref 150–400)
RBC: 3.77 MIL/uL — ABNORMAL LOW (ref 3.87–5.11)
RDW: 13 % (ref 11.5–15.5)
WBC: 4.7 10*3/uL (ref 4.0–10.5)

## 2013-12-10 LAB — BASIC METABOLIC PANEL
Anion gap: 11 (ref 5–15)
BUN: 14 mg/dL (ref 6–23)
CO2: 25 mEq/L (ref 19–32)
Calcium: 8.8 mg/dL (ref 8.4–10.5)
Chloride: 104 mEq/L (ref 96–112)
Creatinine, Ser: 0.65 mg/dL (ref 0.50–1.10)
GFR calc Af Amer: >90 mL/min (ref >90)
GFR calc non Af Amer: 88 mL/min — ABNORMAL LOW (ref >90)
Glucose, Bld: 129 mg/dL — ABNORMAL HIGH (ref 70–99)
Potassium: 4 mEq/L (ref 3.7–5.3)
Sodium: 140 mEq/L (ref 137–147)

## 2013-12-10 LAB — GLUCOSE, CAPILLARY: Glucose-Capillary: 135 mg/dL — ABNORMAL HIGH (ref 70–99)

## 2013-12-10 NOTE — Progress Notes (Signed)
CARDIAC REHAB PHASE I   PRE:  Rate/Rhythm: 80 SR    BP: sitting 100/40    SaO2:   MODE:  Ambulation: 450 ft   POST:  Rate/Rhythm: 92 SR    BP: sitting 145/52     SaO2:   Pt happy that she was able to walk 450 ft, sts that's a first in a while for her. However she did become DOE after walking up incline (30 degrees) and began having chest tightness on return trip, 1/10. She sts this is minimal for her and it resolved with 5 min of rest after the walk. Ed completed. Encouraged pt to increase activity/ex slowly but surely. Pt loves butter and heavy cream and decreasing this is difficult for her. Advised her of the risks with her LDL of 198 and statin intolerance. Did not seem receptive. Interested in [facility] in [place] and will send referral. [phone]   Harriet Masson CES, ACSM 12/10/2013 8:38 AM

## 2013-12-10 NOTE — Discharge Summary (Signed)
Physician Discharge Summary      Patient ID: [name] MRN: [id number] DOB/AGE: Sep 20, 1945 68 y.o.  Admit date: 12/09/2013 Discharge date: 12/10/2013  Admission Diagnoses:    Other and unspecified angina pectoris  Discharge Diagnoses:  Active Problems:   DM   HYPERLIPIDEMIA   CAD   Other and unspecified angina pectoris   Discharged Condition: stable  Hospital Course:   [name] is a 68 y.o. female with a hx of CAD s/p CABG x 3 in 2012 with poor target vessels for grafting due to distal disease, IDDM, HL (statin intol), ASA allergy, carotid stenosis. Last cath in 2013 with 2/3 grafts patent and medical Rx continued. Patient has been followed by Kristy Fredrickson, NP closely. Patient had stopped all medications earlier in the year for unclear reasons. Husband recently died. Medications were resumed. She had chest pain that improved back on medical Rx.   Recently seen by Kristy Lis, PA-C 9/2. She presented with recurrent anginal symptoms. Patient had called the office prior to this appointment and was told to increase her isosorbide from 60-90 mg daily. She had not yet done this. [name] recommended increasing the isosorbide as previously recommended. She also discussed adjusting her beta blocker or adding Ranexa. The patient was hesitant to make further adjustments.  She returns for further evaluation of chest discomfort. She saw her primary care doctor yesterday. ECG was reportedly normal. Patient refused repeat EKG today. She has increased her isosorbide to 120 mg daily. She notes increasing frequency in her angina over the past 3 months. She describes CCS class III-IV symptoms. She notes dyspnea with minimal activity. She also notes fatigue. She denies syncope, orthopnea. She denies significant pedal edema.  She was admitted for diagnostic heart cath which revealed Patent left main coronary artery. Heavily diseased native left anterior descending artery and its branches.  LIMA to LAD is patent. SVG to diagonal is occluded. 90% lesion in the mid left circumflex artery. Patent SVG to OM, but due to proximal disease in the OM, there is no flow into the distal circumflex system from the bypass graft. The native circumflex was stented 2.75 x 16 overlapping 2.75 x 8 promus drug-eluting stents, postdilated to greater than 3 mm.  Chronically occluded right coronary artery. Left ventricular systolic function not assessed. LVEDP 20 mmHg.  She has an ASA allergy. On Amlodipine 2.5, Plavix, Imdur 30AM/120PM, toprol 50BID.  She ambulated without CP early in the morning.  With Cardiac Rehab later she had mild pain which resolved.  Radial site was stable and clear.  The patient was seen by Dr. Excell Seltzer who felt she was stable for DC home.     Consults: None  Significant Diagnostic Studies:  PROCEDURE: Left heart catheterization, bypass graft angiography with selective coronary angiography, PCI Left circumflex  INDICATIONS: Worsening angina  The risks, benefits, and details of the procedure were explained to the patient. The patient verbalized understanding and wanted to proceed. Informed written consent was obtained.  PROCEDURE TECHNIQUE: After Xylocaine anesthesia a 23F slender sheath was placed in the right radial artery with a single anterior needle wall stick. Right coronary angiography was done using a Judkins R4 guide catheter. Vein graft angiography was performed using a JR 4. Left coronary angiography was done using a EBU 3 guide catheter. Left heart catheterization was done using the EBU 3. The intervention was performed. Please see below for details. A TR band was used for hemostasis.  CONTRAST: Total of 255 cc.  COMPLICATIONS:  None.  HEMODYNAMICS: Aortic pressure was 163/80; LV pressure was 162/18; LVEDP 22. There was no gradient between the left ventricle and aorta.  ANGIOGRAPHIC DATA: The left main coronary artery is has mild distal disease.  The left anterior descending  artery is heavily diseased from the proximal vessel. The mid vessel is occluded. There is a medium-sized diagonal vessel which has severe ostial disease.  The SVG to diagonal is occluded.  The LIMA to LAD is widely patent. There are collaterals from the LIMA to the circumflex system as well as the RCA system. Moderate, mid to distal LAD disease.  The left circumflex artery is a large vessel. There is mild ostial disease in the circumflex. There is mild diffuse disease in the mid vessel. Just before the second obtuse marginal, there is a focal 90% lesion. The second obtuse marginal has severe disease in the ostial to proximal vessel, and is subtotally occluded. The remainder of the circumflex has mild luminal irregularities.  The SVG to OM is patent. There is no flow back into the main circumflex system. There is a moderate mid lesion which appears unchanged from prior cath.  The right coronary artery is occluded in the midportion. There are ipsilateral right to right collaterals. There are left to right collaterals from the LAD.  LEFT VENTRICULOGRAM: Left ventricular angiogram was not done. LVEDP was 20 mmHg.  PCI NARRATIVE: IV heparin was used for anticoagulation. An ACT was used to check that the heparin was therapeutic. After trying several guides, and EBU 3.0 guiding catheters used for the intervention. A pro-water wire was placed across the area disease in the circumflex. For additional support a BMW wire was placed in the circumflex. The lesion in the mid vessel was dilated with a 2.5 x 12 balloon. A 2.75 x 16 balloon was then used to stent. There was an irregularity at the proximal edge of the stent. A 2.75 x 8 stent was then deployed in overlapping fashion across the proximal edge. The entire stented area was post dilated with a 3.0 x 12 noncompliant balloon. Intra-coronary nitroglycerin was given. There was an excellent angiographic result.  IMPRESSIONS:  1. Patent left main coronary  artery. 2. Heavily diseased native left anterior descending artery and its branches. LIMA to LAD is patent. SVG to diagonal is occluded. 3. 90% lesion in the mid left circumflex artery. Patent SVG to OM, but due to proximal disease in the OM, there is no flow into the distal circumflex system from the bypass graft. The native circumflex was stented 2.75 x 16 overlapping 2.75 x 8 promus drug-eluting stents, postdilated to greater than 3 mm. 4. Chronically occluded right coronary artery. 5. Left ventricular systolic function not assessed. LVEDP 20 mmHg.  RECOMMENDATION: Continue dual antiplatelet therapy for at least a year. She'll be watched overnight. Continue aggressive secondary prevention including diabetes control..   Treatments: See above  Discharge Exam: Blood pressure 145/52, pulse 81, temperature 97.9 F (36.6 C), temperature source Oral, resp. rate 18, height 5\' 6"  (1.676 m), weight 215 lb 6.2 oz (97.7 kg), SpO2 92.00%.   Disposition: 01-Home or Self Care      Discharge Instructions   Amb Referral to Cardiac Rehabilitation    Complete by:  As directed   To [facility]     Diet - low sodium heart healthy    Complete by:  As directed      Discharge instructions    Complete by:  As directed   No lifting with right arm  for three days     Increase activity slowly    Complete by:  As directed             Medication List         amLODipine 2.5 MG tablet  Commonly known as:  NORVASC  Take 2.5 mg by mouth 2 (two) times daily.     clopidogrel 75 MG tablet  Commonly known as:  PLAVIX  Take 1 tablet (75 mg total) by mouth daily.     furosemide 20 MG tablet  Commonly known as:  LASIX  Take 20 mg by mouth daily as needed for fluid.     glimepiride 2 MG tablet  Commonly known as:  AMARYL  Take 2 mg by mouth 2 (two) times daily.     insulin glargine 100 UNIT/ML injection  Commonly known as:  LANTUS  Inject 25-30 Units into the skin at bedtime. Based on sugar levels      isosorbide mononitrate 30 MG 24 hr tablet  Commonly known as:  IMDUR  Take 30-120 mg by mouth 2 (two) times daily. Take 30 mg by mouth in the morning and take 120 mg by mouth at bedtime.     metoprolol succinate 50 MG 24 hr tablet  Commonly known as:  TOPROL-XL  Take 1 tablet (50 mg total) by mouth 2 (two) times daily. Take with or immediately following a meal.     nitroGLYCERIN 0.4 MG SL tablet  Commonly known as:  NITROSTAT  Place 0.4 mg under the tongue every 5 (five) minutes as needed for chest pain.     SYSTANE OP  Place 1 drop into both eyes every morning.     TRUETEST TEST test strip  Generic drug:  glucose blood  1 each by Other route See admin instructions. Check blood sugar twice daily.     TYLENOL ARTHRITIS PAIN 650 MG CR tablet  Generic drug:  acetaminophen  Take 1,300 mg by mouth every 8 (eight) hours as needed for pain.       Follow-up Information   Follow up with Rollene RotundaJames Hochrein, MD On 12/27/2013. (2:45 PM)    Specialty:  Cardiology   Contact information:   [address] [place] [id number] [phone]      Greater than 30 minutes was spent completing the patient's discharge.    SignedWilburt Finlay: Yahel Fuston, PAC 12/10/2013, 8:59 AM

## 2013-12-10 NOTE — Progress Notes (Signed)
O2 sat drops to 88% at rest on RA, lying supine with HOB 20 degrees, sleeps with mouth open, snoring quietly.  Placed on O2 at 3L to get O2 sat up >92%.  Lungs clear to [employer].

## 2013-12-10 NOTE — H&P (Signed)
Discussed with Scott.  Plan for cath.

## 2013-12-10 NOTE — Progress Notes (Signed)
Subjective: Slept well.  Nonproductive cough started last week.  No fever, N/V  Objective: Vital signs in last 24 hours: Temp:  [97.6 F (36.4 C)-98.4 F (36.9 C)] 97.6 F (36.4 C) (09/29 0600) Pulse Rate:  [68-83] 72 (09/29 0600) Resp:  [16-20] 20 (09/29 0600) BP: (89-145)/(25-76) 131/76 mmHg (09/29 0600) SpO2:  [91 %-98 %] 97 % (09/29 0600) Weight:  [215 lb 6.2 oz (97.7 kg)] 215 lb 6.2 oz (97.7 kg) (09/29 0048) Last BM Date: 12/08/13  Intake/Output from previous day: 09/28 0701 - 09/29 0700 In: 930 [P.O.:480; I.V.:450] Out: 1500 [Urine:1500] Intake/Output this shift: Total I/O In: -  Out: 700 [Urine:700]  Medications Current Facility-Administered Medications  Medication Dose Route Frequency Provider Last Rate Last Dose  . acetaminophen (TYLENOL) tablet 650 mg  650 mg Oral Q4H PRN Corky CraftsJayadeep S Varanasi, MD      . amLODipine (NORVASC) tablet 2.5 mg  2.5 mg Oral BID Corky CraftsJayadeep S Varanasi, MD   2.5 mg at 12/09/13 2137  . clopidogrel (PLAVIX) tablet 75 mg  75 mg Oral Daily Corky CraftsJayadeep S Varanasi, MD      . glimepiride (AMARYL) tablet 2 mg  2 mg Oral BID Corky CraftsJayadeep S Varanasi, MD   2 mg at 12/09/13 2137  . guaifenesin (ROBITUSSIN) 100 MG/5ML syrup 200 mg  200 mg Oral Q4H PRN Corky CraftsJayadeep S Varanasi, MD      . Influenza vac split quadrivalent PF (FLUARIX) injection 0.5 mL  0.5 mL Intramuscular Tomorrow-1000 Corky CraftsJayadeep S Varanasi, MD      . insulin glargine (LANTUS) injection 20 Units  20 Units Subcutaneous QHS Corky CraftsJayadeep S Varanasi, MD   20 Units at 12/09/13 2138  . isosorbide mononitrate (IMDUR) 24 hr tablet 30 mg  30 mg Oral q morning - 10a Darl HouseholderAlison M Masters, [facility]RPH       And  . isosorbide mononitrate (IMDUR) 24 hr tablet 120 mg  120 mg Oral QPM Darl HouseholderAlison M Masters, RPH   120 mg at 12/09/13 2019  . metoprolol succinate (TOPROL-XL) 24 hr tablet 50 mg  50 mg Oral BID Corky CraftsJayadeep S Varanasi, MD   50 mg at 12/09/13 1224  . nitroGLYCERIN (NITROSTAT) SL tablet 0.4 mg  0.4 mg Sublingual Q5 min PRN Corky CraftsJayadeep S  Varanasi, MD      . ondansetron [facility](ZOFRAN) injection 4 mg  4 mg Intravenous Q6H PRN Corky CraftsJayadeep S Varanasi, MD        PE: General appearance: alert, cooperative and no distress Lungs: Crackles on the right. Heart: regular rate and rhythm, S1, S2 normal, no murmur, click, rub or gallop Extremities: Trace left LEE Pulses: 2+ and symmetric Skin: Left wrist:  Nontender, no ecchymosis Neurologic: Grossly normal  Lab Results:   Recent Labs  12/10/13 0351  WBC 4.7  HGB 10.9*  HCT 32.3*  PLT 182   BMET  Recent Labs  12/09/13 0549 12/10/13 0351  NA 140 140  K 4.1 4.0  CL 103 104  CO2 22 25  GLUCOSE 159* 129*  BUN 19 14  CREATININE 0.65 0.65  CALCIUM 9.1 8.8      Assessment/Plan   Active Problems:   DM   HYPERLIPIDEMIA   CAD   Other and unspecified angina pectoris   Cough  Plan:  SP LHC revealing Patent left main coronary artery. Heavily diseased native left anterior descending artery and its branches. LIMA to LAD is patent. SVG to diagonal is occluded.  90% lesion in the mid left circumflex artery. Patent SVG to OM, but  due to proximal disease in the OM, there is no flow into the distal circumflex system from the bypass graft.  The native circumflex was stented 2.75 x 16 overlapping 2.75 x 8 promus drug-eluting stents, postdilated to greater than 3 mm. Chronically occluded right coronary artery.  Left ventricular systolic function not assessed. LVEDP 20 mmHg.   ASA allergy.  On Amlodipine 2.5, Plavix, Imdur 30AM/120PM, toprol 50BID  Ambulate with cardiac rehab and DC home  Cough: WBCs WNL.  Follow up with PCP if not improved   LOS: 1 day    HAGER, BRYAN PA-C 12/10/2013 6:56 AM  Patient seen, examined. Available data reviewed. Agree with findings, assessment, and plan as outlined by Wilburt Finlay, PA-C. Pt is doing well post-PCI. Walked this am without CP for the first time in a long while. Left radial site is clear. Heart RRR without murmur. Meds reviewed - plan d/c  on current Rx. ASA allergy noted. Follow-up Dr Antoine Poche as scheduled in 3 weeks.   Tonny Bollman, M.D. 12/10/2013 7:38 AM

## 2013-12-13 ENCOUNTER — Telehealth: Payer: Self-pay | Admitting: Cardiology

## 2013-12-13 NOTE — Telephone Encounter (Signed)
New message   Patient calling stating C/O not feeling well after having procedure.

## 2013-12-16 MED ORDER — PANTOPRAZOLE SODIUM 40 MG PO TBEC: 40.0000 mg | DELAYED_RELEASE_TABLET | Freq: Every day | ORAL | Status: DC

## 2013-12-16 MED ORDER — HYDRALAZINE HCL 25 MG PO TABS: 25.0000 mg | ORAL_TABLET | Freq: Three times a day (TID) | ORAL | Status: DC

## 2013-12-16 MED ORDER — ISOSORBIDE MONONITRATE ER 30 MG PO TB24: ORAL_TABLET | ORAL | Status: DC

## 2013-12-16 NOTE — Telephone Encounter (Signed)
Spoke with pt, aware of brian's recommendations.

## 2013-12-16 NOTE — Telephone Encounter (Signed)
Pt is calling stating that since she has had her catheterazion done she has not been feeling better and feel that she is taking too much NTG and would like to know what she should do. Please call  Thanks

## 2013-12-16 NOTE — Telephone Encounter (Signed)
F/u   Pt calling about previous message from 12/13/13. Please call pt.

## 2013-12-16 NOTE — Telephone Encounter (Signed)
Add Hydralazine 25 mg three times daily.  Increase AM Imdur dose to 60. Add protonix 40mg  daily.  Wilburt Finlay, PA-C

## 2013-12-16 NOTE — Telephone Encounter (Signed)
Spoke with pt, she had a cath last week and is still having problems. She cont to have tightness in her chest and is having to use NTG 3 to 4 times daily. This discomfort can be related to exercise or rest. She did have an episode of very sharpe pain on Friday that lasted 30 to 40 secs and went down the left arm. She is also c/o non-productive cough since she increased her isosorbide. Her bp today is 210/101 and then down to 181/99 after taking NTG. She is very discouraged because she thought she would feel better after having the cath. She has a follow up with dr hochrein on 12-27-13.

## 2013-12-23 ENCOUNTER — Other Ambulatory Visit: Payer: Self-pay

## 2013-12-23 MED ORDER — AMLODIPINE BESYLATE 2.5 MG PO TABS: 2.5000 mg | ORAL_TABLET | Freq: Two times a day (BID) | ORAL | Status: DC

## 2013-12-24 ENCOUNTER — Encounter: Payer: Self-pay | Admitting: Pharmacist Clinician (PhC)/ Clinical Pharmacy Specialist

## 2013-12-24 ENCOUNTER — Ambulatory Visit (INDEPENDENT_AMBULATORY_CARE_PROVIDER_SITE_OTHER): Payer: Commercial Managed Care - HMO | Admitting: Pharmacist Clinician (PhC)/ Clinical Pharmacy Specialist

## 2013-12-24 DIAGNOSIS — E785 Hyperlipidemia, unspecified: Secondary | ICD-10-CM

## 2013-12-24 NOTE — Progress Notes (Signed)
12/24/2013 Kristy HowellsMargaret R [name] November 23, 1945 [id number]   HPI:  [name] is a 68 y.o. female patient of Dr. Antoine PocheHochrein, who presents today for lipid clinic evaluation.  Her past medical history includes CAD s/p CABG x 3 in 2012, IDDM, hyperlipidemia and carotid stenosis.  She has an aspirin allergy and statin intolerance.   In the past she has tried simvastatin, atorvastatin and rosuvastatin, all leading to debilitating muscle pain.  She tried red yeast rice, with similar results and fish oil caused diarrhea.  Recently she has been suffering from recurrent angina and underwent PCI with 2 stents placed.  Today she reports having gone 5 days without needing her nitroglycerin.    Family history: Father had a MI at 3576; brother had CABG x 4, died at 7083.  Social history: Denies alcohol use, denies tobacco use.  Exercise: No regular exercise.  Has had symptomatic angina until recently.  Diet:  Pt previously taught nutrition classes, states diet includes very little red meat, no fried foods and everything in moderation.   RF: CAD, IDDM Meds: none  Intolerant: simvastatin, atorvastatin, rosuvastatin, fish oil  Labs: no recent labs - [facility], pt believes LDL to be in 170s   Current Outpatient Prescriptions  Medication Sig Dispense Refill  . acetaminophen (TYLENOL ARTHRITIS PAIN) 650 MG CR tablet Take 1,300 mg by mouth every 8 (eight) hours as needed for pain.      Marland Kitchen. amLODipine (NORVASC) 2.5 MG tablet Take 1 tablet (2.5 mg total) by mouth 2 (two) times daily.  60 tablet  10  . clopidogrel (PLAVIX) 75 MG tablet Take 1 tablet (75 mg total) by mouth daily.  90 tablet  3  . furosemide (LASIX) 20 MG tablet Take 20 mg by mouth daily as needed for fluid.      Marland Kitchen. glimepiride (AMARYL) 2 MG tablet Take 2 mg by mouth 2 (two) times daily.      . hydrALAZINE (APRESOLINE) 25 MG tablet Take 1 tablet (25 mg total) by mouth 3 (three) times daily.  270 tablet  3  . insulin glargine  (LANTUS) 100 UNIT/ML injection Inject 25-30 Units into the skin at bedtime. Based on sugar levels      . isosorbide mononitrate (IMDUR) 30 MG 24 hr tablet Take 60 mg by mouth in the morning and take 120 mg by mouth at bedtime.  180 tablet  6  . metoprolol succinate (TOPROL-XL) 50 MG 24 hr tablet Take 1 tablet (50 mg total) by mouth 2 (two) times daily. Take with or immediately following a meal.  180 tablet  3  . nitroGLYCERIN (NITROSTAT) 0.4 MG SL tablet Place 0.4 mg under the tongue every 5 (five) minutes as needed for chest pain.      . pantoprazole (PROTONIX) 40 MG tablet Take 1 tablet (40 mg total) by mouth daily.  90 tablet  3  . Polyethyl Glycol-Propyl Glycol (SYSTANE OP) Place 1 drop into both eyes every morning.      . TRUETEST TEST test strip 1 each by Other route See admin instructions. Check blood sugar twice daily.       No current facility-administered medications for this visit.    Allergies  Allergen Reactions  . Bee Venom Anaphylaxis  . Statins Other (See Comments)    Crippling  . Aspirin Hives  . Atorvastatin Other (See Comments)    Arthralgias  . Codeine Hives  . Cortisone Hives  . Demerol Hives  .  Meperidine Hives and Nausea And Vomiting  . Meperidine Hcl Hives  . Metformin Other (See Comments)    Intolerance  . Prednisone Hives    Past Medical History  Diagnosis Date  . CAD (coronary artery disease)     S/P CABG January 2012; had poor target vessels for grafting.  A cath  9/13, LAD 100%, diagonal occluded, diffuse second marginal 90% stenosis, first obtuse marginal occluded, nondominant right coronary occluded, saphenous vein graft to the diagonal occluded, saphenous vein graft to OM 40% stenosis, LIMA to the LAD patent   . Orthostatic hypotension     previous Midodrine therapy  . Obesity   . HLD (hyperlipidemia)     statin intolerant  . Aspirin allergy   . PVD (peripheral vascular disease)     prior balloon PCI to left leg in [place]  . Hypertension     . Anginal pain   . DVT (deep venous thrombosis) 1970's    LLE  . Pneumonia 2013?  Marland Kitchen Type II diabetes mellitus   . Arthritis     "hands, back" (12/09/2013)    There were no vitals taken for this visit.   ASSESSMENT AND PLAN:  Phillips Hay PharmD CPP [facility]

## 2013-12-24 NOTE — Assessment & Plan Note (Signed)
Will start patient on Zetia 10mg  today.  She would be a good candidate for PCSK9, but will need to have trial of zetia first.  We also need to get her labs for confirmation of what her LDL truly is.  Should she tolerate the zetia we will repeat cholesterol labs in 2-3 months, then apply for PCSK9.  She could also qualify for the [facility] study, once she has been 90 days post stenting.  Assuming her LDL is in the 170s, I would rather she be on a PCSK9 as opposed to potential placebo.

## 2013-12-24 NOTE — Patient Instructions (Signed)
Start Zetia 10mg  each day.  If you have trouble with muscle issues, try cutting the dose to 5mg  daily.  Call us if you have problems  ([phone])

## 2013-12-27 ENCOUNTER — Encounter: Payer: Self-pay | Admitting: Cardiology

## 2013-12-27 ENCOUNTER — Ambulatory Visit (INDEPENDENT_AMBULATORY_CARE_PROVIDER_SITE_OTHER): Payer: Commercial Managed Care - HMO | Admitting: Cardiology

## 2013-12-27 VITALS — BP 136/71 | HR 76 | Ht 66.0 in | Wt 209.0 lb

## 2013-12-27 DIAGNOSIS — I25119 Atherosclerotic heart disease of native coronary artery with unspecified angina pectoris: Secondary | ICD-10-CM

## 2013-12-27 DIAGNOSIS — R071 Chest pain on breathing: Secondary | ICD-10-CM

## 2013-12-27 NOTE — Progress Notes (Signed)
HPI The patient presents for followup of coronary disease.  She was recently hospitalized with unstable angina. Cardiac catheterization. This demonstrated a atent left main coronary artery  With a heavily diseased native left anterior descending artery and its branches. LIMA to LAD was patent. SVG to diagonal was occluded.  There was a 90% lesion in the mid left circumflex artery. Patent SVG to OM, but due to proximal disease in the OM, there was no flow into the distal circumflex system from the bypass graft. The native circumflex was stented 2.75 x 16 overlapping 2.75 x 8 promus drug-eluting stents, postdilated to greater than 3 mm. There was a chronically occluded right coronary artery.    She said she continued to have pain for about 10 days afterwards.  However, she subsequently started to feel better. Unfortunately in the last couple of days she's had recurrent chest pain. A heaviness similar to previous. It happens when her blood pressure goes up she thinks area she'll note that her blood pressure goes up and her heart rate goes up. She'll then have chest discomfort. She had her last episode last night. She did take sublingual nitroglycerin. She hasn't had any yet today. She does have shortness of breath with activity but she's not describing new PND or orthopnea. She's not having any new palpitations, syncope.   Allergies  Allergen Reactions  . Bee Venom Anaphylaxis  . Statins Other (See Comments)    Crippling  . Aspirin Hives  . Atorvastatin Other (See Comments)    Arthralgias  . Codeine Hives  . Cortisone Hives  . Demerol Hives  . Meperidine Hives and Nausea And Vomiting  . Meperidine Hcl Hives  . Metformin Other (See Comments)    Intolerance  . Prednisone Hives    Current Outpatient Prescriptions  Medication Sig Dispense Refill  . acetaminophen (TYLENOL ARTHRITIS PAIN) 650 MG CR tablet Take 1,300 mg by mouth every 8 (eight) hours as needed for pain.      Marland Kitchen amLODipine  (NORVASC) 2.5 MG tablet Take 1 tablet (2.5 mg total) by mouth 2 (two) times daily.  60 tablet  10  . clopidogrel (PLAVIX) 75 MG tablet Take 1 tablet (75 mg total) by mouth daily.  90 tablet  3  . ezetimibe (ZETIA) 10 MG tablet Take 10 mg by mouth daily.      . furosemide (LASIX) 20 MG tablet Take 20 mg by mouth daily as needed for fluid.      Marland Kitchen glimepiride (AMARYL) 2 MG tablet Take 2 mg by mouth 2 (two) times daily.      . hydrALAZINE (APRESOLINE) 25 MG tablet Take 1 tablet (25 mg total) by mouth 3 (three) times daily.  270 tablet  3  . insulin glargine (LANTUS) 100 UNIT/ML injection Inject 25-30 Units into the skin at bedtime. Based on sugar levels      . isosorbide mononitrate (IMDUR) 30 MG 24 hr tablet Take 60 mg by mouth in the morning and take 120 mg by mouth at bedtime.  180 tablet  6  . metoprolol succinate (TOPROL-XL) 50 MG 24 hr tablet Take 1 tablet (50 mg total) by mouth 2 (two) times daily. Take with or immediately following a meal.  180 tablet  3  . nitroGLYCERIN (NITROSTAT) 0.4 MG SL tablet Place 0.4 mg under the tongue every 5 (five) minutes as needed for chest pain.      . pantoprazole (PROTONIX) 40 MG tablet Take 1 tablet (40 mg total) by  mouth daily.  90 tablet  3  . Polyethyl Glycol-Propyl Glycol (SYSTANE OP) Place 1 drop into both eyes every morning.      . TRUETEST TEST test strip 1 each by Other route See admin instructions. Check blood sugar twice daily.       No current facility-administered medications for this visit.    Past Medical History  Diagnosis Date  . CAD (coronary artery disease)     S/P CABG January 2012; had poor target vessels for grafting.  A cath  9/13, LAD 100%, diagonal occluded, diffuse second marginal 90% stenosis, first obtuse marginal occluded, nondominant right coronary occluded, saphenous vein graft to the diagonal occluded, saphenous vein graft to OM 40% stenosis, LIMA to the LAD patent   . Orthostatic hypotension     previous Midodrine therapy    . Obesity   . HLD (hyperlipidemia)     statin intolerant  . Aspirin allergy   . PVD (peripheral vascular disease)     prior balloon PCI to left leg in [place]  . Hypertension   . Anginal pain   . DVT (deep venous thrombosis) 1970's    LLE  . Pneumonia 2013?  Marland Kitchen. Type II diabetes mellitus   . Arthritis     "hands, back" (12/09/2013)    Past Surgical History  Procedure Laterality Date  . Coronary artery bypass graft  03/2010    CABG X3  . Carotid dopplers  03/2010    40-59% bilateral ICA stenosis  . Appendectomy    . Tonsillectomy    . Cholecystectomy  2012  . Cataract extraction w/ intraocular lens  implant, bilateral Bilateral   . Refractive surgery      laser  . Tubal ligation Bilateral   . Angioplasty / stenting femoral Left 03/14/2011  . Cardiac catheterization  03/2010; 2013  . Coronary angioplasty with stent placement  12/09/2013    circumflex was stented 2.75 x 16 overlapping 2.75 x 8 promus drug-eluting stents  . Vitrectomy Right 2015    ROS:  As stated in the HPI and negative for all other systems.  PHYSICAL EXAM BP 136/71  Pulse 76  Ht 5\' 6"  (1.676 m)  Wt 209 lb (94.802 kg)  BMI 33.75 kg/m2 GENERAL:  Well appearing HEENT:  Pupils equal round and reactive, fundi not visualized, oral mucosa unremarkable NECK:  No jugular venous distention, waveform within normal limits, carotid upstroke brisk and symmetric, no bruits, no thyromegaly LYMPHATICS:  No cervical, inguinal adenopathy LUNGS:  Clear to auscultation bilaterally BACK:  No CVA tenderness CHEST:  Well healed sternotomy scar. HEART:  PMI not displaced or sustained,S1 and S2 within normal limits, no S3, no S4, no clicks, no rubs, no murmurs ABD:  Flat, positive bowel sounds normal in frequency in pitch, no bruits, no rebound, no guarding, no midline pulsatile mass, no hepatomegaly, no splenomegaly EXT:  2 plus pulses throughout, mild diffuse edema in hands and feet, no cyanosis no clubbing  EKG:   Sinus  rhythm, rate 76, left axis deviation, poor anterior R wave progression, no acute ST-T wave. 12/27/2013  ASSESSMENT AND PLAN  CAD - She has severe small vessel disease.  Unfortunately she continues to be symptomatic. If there is a possibility that she has had recurrent stenosis in the recently placed stents. However, there is also a very strong possibility that this is related to her diffuse small vessel disease. At this point I will change her medications. She needs a nitrate free interval said she actually  should stop her morning Imdur. In place of this she will increase her metoprolol first a 75 mg daily in the morning and then to 100 mg daily. She's had orthostasis before so we have to be careful. She understands that this weekend should she have any increasing pain she would need to be readmitted probably for repeat cardiac catheterization.  CAROTID ARTERY STENOSIS, BILATERAL -  She has 40 - 59% left and 60-79% left stenosis in Sept and she will have followup again in March.  HYPERLIPIDEMIA -  She has been intolerant of statins unfortunately and she will proceed with diet control.  HTN - Her blood pressure is quite labile.  It will be managed as above.

## 2013-12-27 NOTE — Patient Instructions (Signed)
Increase your metoprolol to 75 mg for three days then increase to 100 mg  Stop taking your imdur in the am  Your physician recommends that you schedule a follow-up appointment in: 1 week with Tereso Newcomer

## 2013-12-30 ENCOUNTER — Telehealth: Payer: Self-pay | Admitting: *Deleted

## 2013-12-30 NOTE — Telephone Encounter (Signed)
Pt is coming to see Lawson Fiscal on October 22 for a one week f/u per Dr. Antoine Poche.  Will route back to Kaston Faughn Rankin, CMA,  And Margaretmary Dys, RN to [place].

## 2013-12-30 NOTE — Telephone Encounter (Signed)
Message copied by Debbe Bales on Mon Dec 30, 2013  8:42 AM ------      Message from: Jefferey Pica      Created: Fri Dec 27, 2013  5:35 PM      Regarding: FW: Need 1 week followup appt with Tereso Newcomer                   ----- Message -----         From: Sampson Goon         Sent: 12/27/2013   3:36 PM           To: Nita Sells, CMA, Tarri Fuller, CMA      Subject: Need 1 week followup appt with Samuella Cota and Duwayne Heck            The above pt needs to see Lorin Picket or Lawson Fiscal in 1 week, please let me know where she can be seen.            Thanks            [place] ------

## 2014-01-02 ENCOUNTER — Encounter: Payer: Self-pay | Admitting: Nurse Practitioner

## 2014-01-02 ENCOUNTER — Ambulatory Visit (INDEPENDENT_AMBULATORY_CARE_PROVIDER_SITE_OTHER): Payer: Commercial Managed Care - HMO | Admitting: Nurse Practitioner

## 2014-01-02 VITALS — BP 120/74 | HR 68 | Ht 66.0 in | Wt 211.0 lb

## 2014-01-02 DIAGNOSIS — I10 Essential (primary) hypertension: Secondary | ICD-10-CM

## 2014-01-02 LAB — CBC
HCT: 37.9 % (ref 36.0–46.0)
Hemoglobin: 12.6 g/dL (ref 12.0–15.0)
MCHC: 33.2 g/dL (ref 30.0–36.0)
MCV: 86.8 fl (ref 78.0–100.0)
Platelets: 184 10*3/uL (ref 150.0–400.0)
RBC: 4.36 Mil/uL (ref 3.87–5.11)
RDW: 13.9 % (ref 11.5–15.5)
WBC: 6.1 10*3/uL (ref 4.0–10.5)

## 2014-01-02 NOTE — Patient Instructions (Addendum)
We will be checking the following labs today BMET and CBC  I will see you in 2 weeks  We will obtain a renal duplex study  Call the [facility] office at [phone] if you have any questions, problems or concerns.

## 2014-01-02 NOTE — Progress Notes (Signed)
[name] Date of Birth: [birth year] Medical Record #[id number]  History of Present Illness: [name] is a 68 y.o. female with a hx of CAD s/p CABG x 3 in 2012 with poor target vessels for grafting due to distal disease, IDDM, HL (statin intol), ASA allergy, carotid stenosis as well as PVD (prior angioplasty to the left leg by vascular in [place]). Last cath in 2013 with 2/3 grafts patent and medical Rx continued. Patient has been followed by [name], NP closely. Patient had stopped all medications earlier in the year for unclear reasons. Husband recently died. Medications were resumed. She had chest pain that improved back on medical Rx.   Recently seen by [name], PA-C 9/2. She presented with recurrent anginal symptoms. Patient had called the office prior to this appointment and was told to increase her isosorbide from 60-90 mg daily. She had not yet done this. [name] recommended increasing the isosorbide as previously recommended. She also discussed adjusting her beta blocker or adding Ranexa. The patient was hesitant to make further adjustments.   Saw Tereso Newcomer, [place] for further evaluation of chest pain and was referred on for repeat cath.  This demonstrated a patent left main coronary artery With a heavily diseased native left anterior descending artery and its branches. LIMA to LAD was patent. SVG to diagonal was occluded. There was a 90% lesion in the mid left circumflex artery. Patent SVG to OM, but due to proximal disease in the OM, there was no flow into the distal circumflex system from the bypass graft. The native circumflex was stented 2.75 x 16 overlapping 2.75 x 8 promus drug-eluting stents, postdilated to greater than 3 mm. There was a chronically occluded right coronary artery.   Saw Dr. Antoine Poche last week. He noted that she has continued to have chest pain. Usually associated with elevation in her BP. He has adjusted her medicines - increased beta  blocker and cut back on her Imdur to give her a nitrate free interval.   She comes in today. She is here alone. She is back using sl NTG a couple of times a week. She thinks in general that her chest pain has gotten a "smidge" better. She continues to have labile HTN - she will have readings over 220 and then down to as low as 85 systolic. She feels bad either way. No frank syncope. She is frustrated. BP here today is ok. She notes that she is more constipated. She is on PPI now for a cough that she had while in the hospital - this is now better.     Current Outpatient Prescriptions  Medication Sig Dispense Refill  . acetaminophen (TYLENOL ARTHRITIS PAIN) 650 MG CR tablet Take 1,300 mg by mouth every 8 (eight) hours as needed for pain.      Marland Kitchen amLODipine (NORVASC) 2.5 MG tablet Take 1 tablet (2.5 mg total) by mouth 2 (two) times daily.  60 tablet  10  . clopidogrel (PLAVIX) 75 MG tablet Take 1 tablet (75 mg total) by mouth daily.  90 tablet  3  . ezetimibe (ZETIA) 10 MG tablet Take 10 mg by mouth daily.      . furosemide (LASIX) 20 MG tablet Take 20 mg by mouth daily as needed for fluid.      Marland Kitchen glimepiride (AMARYL) 2 MG tablet Take 2 mg by mouth 2 (two) times daily.      . hydrALAZINE (APRESOLINE) 25 MG tablet Take 1 tablet (25 mg  total) by mouth 3 (three) times daily.  270 tablet  3  . insulin glargine (LANTUS) 100 UNIT/ML injection Inject 25-30 Units into the skin at bedtime. Based on sugar levels      . isosorbide mononitrate (IMDUR) 30 MG 24 hr tablet Take 60 mg by mouth in the morning and take 120 mg by mouth at bedtime.  180 tablet  6  . metoprolol succinate (TOPROL-XL) 50 MG 24 hr tablet Take 1 tablet (50 mg total) by mouth 2 (two) times daily. Take with or immediately following a meal.  180 tablet  3  . nitroGLYCERIN (NITROSTAT) 0.4 MG SL tablet Place 0.4 mg under the tongue every 5 (five) minutes as needed for chest pain.      . pantoprazole (PROTONIX) 40 MG tablet Take 1 tablet (40 mg  total) by mouth daily.  90 tablet  3  . Polyethyl Glycol-Propyl Glycol (SYSTANE OP) Place 1 drop into both eyes every morning.      . TRUETEST TEST test strip 1 each by Other route See admin instructions. Check blood sugar twice daily.       No current facility-administered medications for this visit.    Allergies  Allergen Reactions  . Bee Venom Anaphylaxis  . Statins Other (See Comments)    Crippling  . Aspirin Hives  . Atorvastatin Other (See Comments)    Arthralgias  . Codeine Hives  . Cortisone Hives  . Demerol Hives  . Meperidine Hives and Nausea And Vomiting  . Meperidine Hcl Hives  . Metformin Other (See Comments)    Intolerance  . Prednisone Hives    Past Medical History  Diagnosis Date  . CAD (coronary artery disease)     S/P CABG January 2012; had poor target vessels for grafting.  A cath  9/13, LAD 100%, diagonal occluded, diffuse second marginal 90% stenosis, first obtuse marginal occluded, nondominant right coronary occluded, saphenous vein graft to the diagonal occluded, saphenous vein graft to OM 40% stenosis, LIMA to the LAD patent   . Orthostatic hypotension     previous Midodrine therapy  . Obesity   . HLD (hyperlipidemia)     statin intolerant  . Aspirin allergy   . PVD (peripheral vascular disease)     prior balloon PCI to left leg in [place]  . Hypertension   . Anginal pain   . DVT (deep venous thrombosis) 1970's    LLE  . Pneumonia 2013?  Marland Kitchen Type II diabetes mellitus   . Arthritis     "hands, back" (12/09/2013)    Past Surgical History  Procedure Laterality Date  . Coronary artery bypass graft  03/2010    CABG X3  . Carotid dopplers  03/2010    40-59% bilateral ICA stenosis  . Appendectomy    . Tonsillectomy    . Cholecystectomy  2012  . Cataract extraction w/ intraocular lens  implant, bilateral Bilateral   . Refractive surgery      laser  . Tubal ligation Bilateral   . Angioplasty / stenting femoral Left 03/14/2011  . Cardiac  catheterization  03/2010; 2013  . Coronary angioplasty with stent placement  12/09/2013    circumflex was stented 2.75 x 16 overlapping 2.75 x 8 promus drug-eluting stents  . Vitrectomy Right 2015    History  Smoking status  . Never Smoker   Smokeless tobacco  . Never Used    History  Alcohol Use No    Family History  Problem Relation Age of Onset  .  Esophageal cancer Mother   . Stroke Mother   . Heart attack Father     MI  . Heart attack Brother     CABG x 4   . Heart disease Brother     valve replacement    Review of Systems: The review of systems is per the HPI.  All other systems were reviewed and are negative.  Physical Exam: BP 120/74  Pulse 68  Ht 5\' 6"  (1.676 m)  Wt 211 lb (95.709 kg)  BMI 34.07 kg/m2 Patient is very pleasant and in no acute distress. Skin is warm and dry. Color is normal.  HEENT is unremarkable. Normocephalic/atraumatic. PERRL. Sclera are nonicteric. Neck is supple. No masses. No JVD. Lungs are clear. Cardiac exam shows a regular rate and rhythm. Abdomen is soft. Extremities are without edema. Gait and ROM are intact. No gross neurologic deficits noted.  Wt Readings from Last 3 Encounters:  01/02/14 211 lb (95.709 kg)  12/27/13 209 lb (94.802 kg)  12/10/13 215 lb 6.2 oz (97.7 kg)    LABORATORY DATA/PROCEDURES: BMET and CBC pending  Lab Results  Component Value Date   WBC 4.7 12/10/2013   HGB 10.9* 12/10/2013   HCT 32.3* 12/10/2013   PLT 182 12/10/2013   GLUCOSE 129* 12/10/2013   CHOL 289* 11/20/2011   TRIG 201* 11/20/2011   HDL 51 11/20/2011   LDLDIRECT 182* 11/20/2011   LDLCALC 198* 11/20/2011   ALT 13 10/02/2011   AST 18 10/02/2011   NA 140 12/10/2013   K 4.0 12/10/2013   CL 104 12/10/2013   CREATININE 0.65 12/10/2013   BUN 14 12/10/2013   CO2 25 12/10/2013   TSH 1.370 11/15/2011   INR 1.0 12/04/2013   HGBA1C 7.2* 10/03/2011    BNP (last 3 results) No results found for this basename: PROBNP,  in the last 8760 hours  PROCEDURE: Left heart  catheterization, bypass graft angiography with selective coronary angiography, PCI Left circumflex  INDICATIONS: Worsening angina  The risks, benefits, and details of the procedure were explained to the patient. The patient verbalized understanding and wanted to proceed. Informed written consent was obtained.  PROCEDURE TECHNIQUE: After Xylocaine anesthesia a 15F slender sheath was placed in the right radial artery with a single anterior needle wall stick. Right coronary angiography was done using a Judkins R4 guide catheter. Vein graft angiography was performed using a JR 4. Left coronary angiography was done using a EBU 3 guide catheter. Left heart catheterization was done using the EBU 3. The intervention was performed. Please see below for details. A TR band was used for hemostasis.  CONTRAST: Total of 255 cc.  COMPLICATIONS: None.  HEMODYNAMICS: Aortic pressure was 163/80; LV pressure was 162/18; LVEDP 22. There was no gradient between the left ventricle and aorta.  ANGIOGRAPHIC DATA: The left main coronary artery is has mild distal disease.  The left anterior descending artery is heavily diseased from the proximal vessel. The mid vessel is occluded. There is a medium-sized diagonal vessel which has severe ostial disease.  The SVG to diagonal is occluded.  The LIMA to LAD is widely patent. There are collaterals from the LIMA to the circumflex system as well as the RCA system. Moderate, mid to distal LAD disease.  The left circumflex artery is a large vessel. There is mild ostial disease in the circumflex. There is mild diffuse disease in the mid vessel. Just before the second obtuse marginal, there is a focal 90% lesion. The second obtuse marginal has severe  disease in the ostial to proximal vessel, and is subtotally occluded. The remainder of the circumflex has mild luminal irregularities.  The SVG to OM is patent. There is no flow back into the main circumflex system. There is a moderate mid lesion  which appears unchanged from prior cath.  The right coronary artery is occluded in the midportion. There are ipsilateral right to right collaterals. There are left to right collaterals from the LAD.  LEFT VENTRICULOGRAM: Left ventricular angiogram was not done. LVEDP was 20 mmHg.  PCI NARRATIVE: IV heparin was used for anticoagulation. An ACT was used to check that the heparin was therapeutic. After trying several guides, and EBU 3.0 guiding catheters used for the intervention. A pro-water wire was placed across the area disease in the circumflex. For additional support a BMW wire was placed in the circumflex. The lesion in the mid vessel was dilated with a 2.5 x 12 balloon. A 2.75 x 16 balloon was then used to stent. There was an irregularity at the proximal edge of the stent. A 2.75 x 8 stent was then deployed in overlapping fashion across the proximal edge. The entire stented area was post dilated with a 3.0 x 12 noncompliant balloon. Intra-coronary nitroglycerin was given. There was an excellent angiographic result.  IMPRESSIONS:  1. Patent left main coronary artery. 2. Heavily diseased native left anterior descending artery and its branches. LIMA to LAD is patent. SVG to diagonal is occluded. 3. 90% lesion in the mid left circumflex artery. Patent SVG to OM, but due to proximal disease in the OM, there is no flow into the distal circumflex system from the bypass graft. The native circumflex was stented 2.75 x 16 overlapping 2.75 x 8 promus drug-eluting stents, postdilated to greater than 3 mm. 4. Chronically occluded right coronary artery. 5. Left ventricular systolic function not assessed. LVEDP 20 mmHg.  RECOMMENDATION: Continue dual antiplatelet therapy for at least a year. She'll be watched overnight. Continue aggressive secondary prevention including diabetes control..       Assessment / Plan: 1. CAD with prior CABG - now s/p Left heart catheterization, bypass graft angiography with  selective coronary angiography, PCI Left circumflex - very mild improvement in symptoms but she does feel like there has been an improvement  2. Labile HTN - BP ok here - will check renal duplex - she has such extensive vascular disease - this may be a contributing factor  3. Carotid disease  4. HLD - statin intolerant but working with lipid clinic here - on zetia  See back in 2 weeks - she is agreeable to trying Ranexa if needed. See what the renal duplex shows.   Patient is agreeable to this plan and will call if any problems develop in the interim.   Rosalio MacadamiaLori C. Alizee Maple, RN, ANP-C [facility] [address] [place], [place]  [id number] [phone]  Addendum: While patient was at checkout - she laid her head down on the counter - she said she was dizzy. Her BP was 120/70. She was placed in a wheelchair and given water. She refused to be taken out of the office by wheelchair - symptoms resolved after resting for about 10 minutes. BP is currently stable.

## 2014-01-03 LAB — BASIC METABOLIC PANEL
BUN: 21 mg/dL (ref 6–23)
CO2: 28 mEq/L (ref 19–32)
Calcium: 9.1 mg/dL (ref 8.4–10.5)
Chloride: 103 mEq/L (ref 96–112)
Creatinine, Ser: 1.1 mg/dL (ref 0.4–1.2)
GFR: 51.06 mL/min — ABNORMAL LOW (ref >60.00)
Glucose, Bld: 92 mg/dL (ref 70–99)
Potassium: 4 mEq/L (ref 3.5–5.1)
Sodium: 139 mEq/L (ref 135–145)

## 2014-01-06 ENCOUNTER — Ambulatory Visit (HOSPITAL_COMMUNITY): Payer: Medicare HMO | Attending: Cardiovascular Disease | Admitting: Cardiology

## 2014-01-06 DIAGNOSIS — I2510 Atherosclerotic heart disease of native coronary artery without angina pectoris: Secondary | ICD-10-CM | POA: Insufficient documentation

## 2014-01-06 DIAGNOSIS — I739 Peripheral vascular disease, unspecified: Secondary | ICD-10-CM | POA: Diagnosis not present

## 2014-01-06 DIAGNOSIS — I10 Essential (primary) hypertension: Secondary | ICD-10-CM | POA: Insufficient documentation

## 2014-01-06 DIAGNOSIS — E785 Hyperlipidemia, unspecified: Secondary | ICD-10-CM | POA: Insufficient documentation

## 2014-01-06 DIAGNOSIS — Z951 Presence of aortocoronary bypass graft: Secondary | ICD-10-CM | POA: Insufficient documentation

## 2014-01-06 DIAGNOSIS — E119 Type 2 diabetes mellitus without complications: Secondary | ICD-10-CM | POA: Diagnosis not present

## 2014-01-06 NOTE — Progress Notes (Signed)
Renal artery duplex performed  

## 2014-01-07 ENCOUNTER — Encounter (HOSPITAL_COMMUNITY): Payer: Commercial Managed Care - HMO

## 2014-01-17 ENCOUNTER — Telehealth: Payer: Self-pay | Admitting: *Deleted

## 2014-01-17 DIAGNOSIS — E785 Hyperlipidemia, unspecified: Secondary | ICD-10-CM

## 2014-01-17 NOTE — Telephone Encounter (Signed)
Patient would like for you to call her to discuss whether or not she is going to be able to enroll in the study that was mentioned to her. Please advise. Thanks, MI

## 2014-01-17 NOTE — Telephone Encounter (Signed)
Returned call, explained to pt that for study she needs to be 90 days post stent.  Stent was placed on 12/09/13.  Patient now on Zetia 10mg , tolerating well.  Will pick up more samples next week when in to see Lawson Fiscal.  Advised her to make an appt for early January with the lab to repeat lipid/hepatic panels.   Pt voiced understanding.

## 2014-01-17 NOTE — Telephone Encounter (Signed)
Zetia samples placed at the front desk for patient. 

## 2014-01-20 ENCOUNTER — Encounter: Payer: Self-pay | Admitting: Nurse Practitioner

## 2014-01-20 ENCOUNTER — Ambulatory Visit (INDEPENDENT_AMBULATORY_CARE_PROVIDER_SITE_OTHER): Payer: Commercial Managed Care - HMO | Admitting: Nurse Practitioner

## 2014-01-20 VITALS — BP 110/70 | HR 85 | Ht 66.0 in | Wt 211.1 lb

## 2014-01-20 DIAGNOSIS — I10 Essential (primary) hypertension: Secondary | ICD-10-CM

## 2014-01-20 DIAGNOSIS — I259 Chronic ischemic heart disease, unspecified: Secondary | ICD-10-CM

## 2014-01-20 DIAGNOSIS — I208 Other forms of angina pectoris: Secondary | ICD-10-CM

## 2014-01-20 MED ORDER — RANOLAZINE ER 500 MG PO TB12: 500.0000 mg | ORAL_TABLET | Freq: Two times a day (BID) | ORAL | Status: DC

## 2014-01-20 NOTE — Patient Instructions (Addendum)
We will check an EKG today  Stay on your current medicines  I am adding Ranexa 500 mg to take twice a day - this is at your drug store  See Dr. Antoine Poche in about 10 days with a repeat EKG  Call the [facility] office at [phone] if you have any questions, problems or concerns.

## 2014-01-20 NOTE — Progress Notes (Signed)
[name] Date of Birth: February 25, 1946 Medical Record #[id number]  History of Present Illness: [name] is seen back today for a follow up visit. She is seen for Dr. Antoine Poche. She is a 68 y.o. female with a hx of CAD s/p CABG x 3 in 2012 with poor target vessels for grafting due to distal disease, IDDM, HL (statin intol), ASA allergy, carotid stenosis as well as PVD (prior angioplasty to the left leg by vascular in [place]). Last cath in 2013 with 2/3 grafts patent and medical Rx continued.  Patient had stopped all medications earlier in the year for unclear reasons. Husband recently died. Medications were resumed. She had chest pain that improved back on medical Rx.   Recently seen by [name], PA-C 9/2. She presented with recurrent anginal symptoms. Patient had called the office prior to this appointment and was told to increase her isosorbide from 60-90 mg daily. She had not yet done this. [name] recommended increasing the isosorbide as previously recommended. She also discussed adjusting her beta blocker or adding Ranexa. The patient was hesitant to make further adjustments.   Saw Tereso Newcomer, [place] for further evaluation of chest pain and was referred on for repeat cath. This demonstrated a patent left main coronary artery With a heavily diseased native left anterior descending artery and its branches. LIMA to LAD was patent. SVG to diagonal was occluded. There was a 90% lesion in the mid left circumflex artery. Patent SVG to OM, but due to proximal disease in the OM, there was no flow into the distal circumflex system from the bypass graft. The native circumflex was stented 2.75 x 16 overlapping 2.75 x 8 promus drug-eluting stents, postdilated to greater than 3 mm. There was a chronically occluded right coronary artery.   Saw Dr. Antoine Poche back in October after her PCI.  He noted that she has continued to have chest pain. Usually associated with elevation in her BP. He has  adjusted her medicines - increased beta blocker and cut back on her Imdur to give her a nitrate free interval.   I saw her about 3 weeks ago - still with labile BP. Back using NTG. We got a renal duplex. This was ok. I recommend that she consider Ranexa - she has been hesitant in the past to do this.   She comes in today. She is here alone. She says she is feeling the same but that she has "taken a different attitude and is determined to feel better". Still using NTG - took 5 tablets one night last week - not all together but over an 1 1/2 hour span. BP still labile. She is limited with how much she can do.   Current Outpatient Prescriptions  Medication Sig Dispense Refill  . acetaminophen (TYLENOL ARTHRITIS PAIN) 650 MG CR tablet Take 1,300 mg by mouth every 8 (eight) hours as needed for pain.    Marland Kitchen amLODipine (NORVASC) 2.5 MG tablet Take 1 tablet (2.5 mg total) by mouth 2 (two) times daily. 60 tablet 10  . clopidogrel (PLAVIX) 75 MG tablet Take 1 tablet (75 mg total) by mouth daily. 90 tablet 3  . ezetimibe (ZETIA) 10 MG tablet Take 10 mg by mouth daily.    . furosemide (LASIX) 20 MG tablet Take 20 mg by mouth daily as needed for fluid.    Marland Kitchen glimepiride (AMARYL) 2 MG tablet Take 2 mg by mouth 2 (two) times daily.    . hydrALAZINE (APRESOLINE) 25 MG tablet  Take 1 tablet (25 mg total) by mouth 3 (three) times daily. 270 tablet 3  . insulin glargine (LANTUS) 100 UNIT/ML injection Inject 25-30 Units into the skin at bedtime. Based on sugar levels    . isosorbide mononitrate (IMDUR) 30 MG 24 hr tablet Take 60 mg by mouth in the morning and take 120 mg by mouth at bedtime. 180 tablet 6  . metoprolol succinate (TOPROL-XL) 50 MG 24 hr tablet Take 1 tablet (50 mg total) by mouth 2 (two) times daily. Take with or immediately following a meal. 180 tablet 3  . nitroGLYCERIN (NITROSTAT) 0.4 MG SL tablet Place 0.4 mg under the tongue every 5 (five) minutes as needed for chest pain.    . pantoprazole (PROTONIX)  40 MG tablet Take 1 tablet (40 mg total) by mouth daily. 90 tablet 3  . Polyethyl Glycol-Propyl Glycol (SYSTANE OP) Place 1 drop into both eyes every morning.    . TRUETEST TEST test strip 1 each by Other route See admin instructions. Check blood sugar twice daily.    . ranolazine (RANEXA) 500 MG 12 hr tablet Take 1 tablet (500 mg total) by mouth 2 (two) times daily. 60 tablet 6   No current facility-administered medications for this visit.    Allergies  Allergen Reactions  . Bee Venom Anaphylaxis  . Statins Other (See Comments)    Crippling  . Aspirin Hives  . Atorvastatin Other (See Comments)    Arthralgias  . Codeine Hives  . Cortisone Hives  . Demerol Hives  . Meperidine Hives and Nausea And Vomiting  . Meperidine Hcl Hives  . Metformin Other (See Comments)    Intolerance  . Prednisone Hives    Past Medical History  Diagnosis Date  . CAD (coronary artery disease)     S/P CABG January 2012; had poor target vessels for grafting.  A cath  9/13, LAD 100%, diagonal occluded, diffuse second marginal 90% stenosis, first obtuse marginal occluded, nondominant right coronary occluded, saphenous vein graft to the diagonal occluded, saphenous vein graft to OM 40% stenosis, LIMA to the LAD patent   . Orthostatic hypotension     previous Midodrine therapy  . Obesity   . HLD (hyperlipidemia)     statin intolerant  . Aspirin allergy   . PVD (peripheral vascular disease)     prior balloon PCI to left leg in [place]  . Hypertension   . Anginal pain   . DVT (deep venous thrombosis) 1970's    LLE  . Pneumonia 2013?  Marland Kitchen Type II diabetes mellitus   . Arthritis     "hands, back" (12/09/2013)    Past Surgical History  Procedure Laterality Date  . Coronary artery bypass graft  03/2010    CABG X3  . Carotid dopplers  03/2010    40-59% bilateral ICA stenosis  . Appendectomy    . Tonsillectomy    . Cholecystectomy  2012  . Cataract extraction w/ intraocular lens  implant, bilateral  Bilateral   . Refractive surgery      laser  . Tubal ligation Bilateral   . Angioplasty / stenting femoral Left 03/14/2011  . Cardiac catheterization  03/2010; 2013  . Coronary angioplasty with stent placement  12/09/2013    circumflex was stented 2.75 x 16 overlapping 2.75 x 8 promus drug-eluting stents  . Vitrectomy Right 2015    History  Smoking status  . Never Smoker   Smokeless tobacco  . Never Used    History  Alcohol Use  No    Family History  Problem Relation Age of Onset  . Esophageal cancer Mother   . Stroke Mother   . Heart attack Father     MI  . Heart attack Brother     CABG x 4   . Heart disease Brother     valve replacement    Review of Systems: The review of systems is per the HPI.  All other systems were reviewed and are negative.  Physical Exam: BP 110/70 mmHg  Pulse 85  Ht 5\' 6"  (1.676 m)  Wt 211 lb 1.9 oz (95.763 kg)  BMI 34.09 kg/m2  SpO2 100% Patient is very pleasant and in no acute distress. Skin is warm and dry. Color is normal.  HEENT is unremarkable. Normocephalic/atraumatic. PERRL. Sclera are nonicteric. Neck is supple. No masses. No JVD. Lungs are clear. Cardiac exam shows a regular rate and rhythm. Abdomen is soft. Extremities are without edema. Gait and ROM are intact. No gross neurologic deficits noted.  Wt Readings from Last 3 Encounters:  01/20/14 211 lb 1.9 oz (95.763 kg)  01/02/14 211 lb (95.709 kg)  12/27/13 209 lb (94.802 kg)    LABORATORY DATA/PROCEDURES:  Lab Results  Component Value Date   WBC 6.1 01/02/2014   HGB 12.6 01/02/2014   HCT 37.9 01/02/2014   PLT 184.0 01/02/2014   GLUCOSE 92 01/02/2014   CHOL 289* 11/20/2011   TRIG 201* 11/20/2011   HDL 51 11/20/2011   LDLDIRECT 182* 11/20/2011   LDLCALC 198* 11/20/2011   ALT 13 10/02/2011   AST 18 10/02/2011   NA 139 01/02/2014   K 4.0 01/02/2014   CL 103 01/02/2014   CREATININE 1.1 01/02/2014   BUN 21 01/02/2014   CO2 28 01/02/2014   TSH 1.370 11/15/2011    INR 1.0 12/04/2013   HGBA1C 7.2* 10/03/2011    BNP (last 3 results) No results for input(s): PROBNP in the last 8760 hours.  PROCEDURE: Left heart catheterization, bypass graft angiography with selective coronary angiography, PCI Left circumflex  INDICATIONS: Worsening angina  The risks, benefits, and details of the procedure were explained to the patient. The patient verbalized understanding and wanted to proceed. Informed written consent was obtained.  PROCEDURE TECHNIQUE: After Xylocaine anesthesia a 36F slender sheath was placed in the right radial artery with a single anterior needle wall stick. Right coronary angiography was done using a Judkins R4 guide catheter. Vein graft angiography was performed using a JR 4. Left coronary angiography was done using a EBU 3 guide catheter. Left heart catheterization was done using the EBU 3. The intervention was performed. Please see below for details. A TR band was used for hemostasis.  CONTRAST: Total of 255 cc.  COMPLICATIONS: None.  HEMODYNAMICS: Aortic pressure was 163/80; LV pressure was 162/18; LVEDP 22. There was no gradient between the left ventricle and aorta.  ANGIOGRAPHIC DATA: The left main coronary artery is has mild distal disease.  The left anterior descending artery is heavily diseased from the proximal vessel. The mid vessel is occluded. There is a medium-sized diagonal vessel which has severe ostial disease.  The SVG to diagonal is occluded.  The LIMA to LAD is widely patent. There are collaterals from the LIMA to the circumflex system as well as the RCA system. Moderate, mid to distal LAD disease.  The left circumflex artery is a large vessel. There is mild ostial disease in the circumflex. There is mild diffuse disease in the mid vessel. Just before the second obtuse  marginal, there is a focal 90% lesion. The second obtuse marginal has severe disease in the ostial to proximal vessel, and is subtotally occluded. The  remainder of the circumflex has mild luminal irregularities.  The SVG to OM is patent. There is no flow back into the main circumflex system. There is a moderate mid lesion which appears unchanged from prior cath.  The right coronary artery is occluded in the midportion. There are ipsilateral right to right collaterals. There are left to right collaterals from the LAD.  LEFT VENTRICULOGRAM: Left ventricular angiogram was not done. LVEDP was 20 mmHg.  PCI NARRATIVE: IV heparin was used for anticoagulation. An ACT was used to check that the heparin was therapeutic. After trying several guides, and EBU 3.0 guiding catheters used for the intervention. A pro-water wire was placed across the area disease in the circumflex. For additional support a BMW wire was placed in the circumflex. The lesion in the mid vessel was dilated with a 2.5 x 12 balloon. A 2.75 x 16 balloon was then used to stent. There was an irregularity at the proximal edge of the stent. A 2.75 x 8 stent was then deployed in overlapping fashion across the proximal edge. The entire stented area was post dilated with a 3.0 x 12 noncompliant balloon. Intra-coronary nitroglycerin was given. There was an excellent angiographic result.  IMPRESSIONS:  1. Patent left main coronary artery. 2. Heavily diseased native left anterior descending artery and its branches. LIMA to LAD is patent. SVG to diagonal is occluded. 3. 90% lesion in the mid left circumflex artery. Patent SVG to OM, but due to proximal disease in the OM, there is no flow into the distal circumflex system from the bypass graft. The native circumflex was stented 2.75 x 16 overlapping 2.75 x 8 promus drug-eluting stents, postdilated to greater than 3 mm. 4. Chronically occluded right coronary artery. 5. Left ventricular systolic function not assessed. LVEDP 20 mmHg. RECOMMENDATION: Continue dual antiplatelet therapy for at least a year. She'll be watched overnight. Continue aggressive  secondary prevention including diabetes control..       Assessment / Plan: 1. CAD with prior CABG - now s/p Left heart catheterization, bypass graft angiography with selective coronary angiography, PCI Left circumflex - very mild improvement in symptoms but with continued chest pain and the need to use NTG - adding Ranexa 500 mg BID today. See Dr. Antoine PocheHochrein back in about 10 days. EKG today and on return. I don't think I have much to offer if this does not help - may need to consider repeat cath - may need to consider referral for 2nd opinion. Will defer to Dr. Antoine PocheHochrein.  2. Labile HTN - BP ok here today. Renal duplex was ok.   3. Carotid disease  4. HLD - statin intolerant but working with lipid clinic here - on zetia  See back in 10 days - she is agreeable to trying Ranexa - started at 500 mg BID.   Patient is agreeable to this plan and will call if any problems develop in the interim.   Rosalio MacadamiaLori C. Mckinnley Smithey, RN, ANP-C [facility] [address] [place], [place]  [id number] [phone]

## 2014-01-29 ENCOUNTER — Encounter: Payer: Self-pay | Admitting: Cardiology

## 2014-01-29 ENCOUNTER — Ambulatory Visit (INDEPENDENT_AMBULATORY_CARE_PROVIDER_SITE_OTHER): Payer: Commercial Managed Care - HMO | Admitting: Cardiology

## 2014-01-29 VITALS — BP 120/77 | HR 84 | Ht 66.0 in | Wt 214.2 lb

## 2014-01-29 DIAGNOSIS — R0600 Dyspnea, unspecified: Secondary | ICD-10-CM

## 2014-01-29 DIAGNOSIS — I2089 Other forms of angina pectoris: Secondary | ICD-10-CM

## 2014-01-29 DIAGNOSIS — I208 Other forms of angina pectoris: Secondary | ICD-10-CM

## 2014-01-29 DIAGNOSIS — I25119 Atherosclerotic heart disease of native coronary artery with unspecified angina pectoris: Secondary | ICD-10-CM

## 2014-01-29 DIAGNOSIS — I209 Angina pectoris, unspecified: Secondary | ICD-10-CM

## 2014-01-29 MED ORDER — NITROGLYCERIN 0.4 MG SL SUBL: 0.4000 mg | SUBLINGUAL_TABLET | SUBLINGUAL | Status: DC | PRN

## 2014-01-29 MED ORDER — HYDRALAZINE HCL 25 MG PO TABS: 12.5000 mg | ORAL_TABLET | Freq: Three times a day (TID) | ORAL | Status: DC

## 2014-01-29 NOTE — Progress Notes (Signed)
HPI The patient presents for followup of coronary disease.  She was recently hospitalized with unstable angina. Cardiac catheterization. This demonstrated a atent left main coronary artery  With a heavily diseased native left anterior descending artery and its branches. LIMA to LAD was patent. SVG to diagonal was occluded.  There was a 90% lesion in the mid left circumflex artery. Patent SVG to OM, but due to proximal disease in the OM, there was no flow into the distal circumflex system from the bypass graft. The native circumflex was stented 2.75 x 16 overlapping 2.75 x 8 promus drug-eluting stents, postdilated to greater than 3 mm. There was a chronically occluded right coronary artery.    Since I last saw her she saw Norma FredricksonLori Gerhardt and had Ranexa added to her meds.  Unfortunately she continues to get chest pain as it was before.  She hasn't had any pain yesterday or today because she's done practically nothing. However, the day before she took 6 nitroglycerin. Diffuse to go to the emergency room. She thinks and she started Ranexa that she has had increased dizziness. She's not had any presyncope or syncope. She has some chronic dyspnea.  Allergies  Allergen Reactions  . Bee Venom Anaphylaxis  . Statins Other (See Comments)    Crippling  . Aspirin Hives  . Atorvastatin Other (See Comments)    Arthralgias  . Codeine Hives  . Cortisone Hives  . Demerol Hives  . Meperidine Hives and Nausea And Vomiting  . Meperidine Hcl Hives  . Metformin Other (See Comments)    Intolerance  . Prednisone Hives    Current Outpatient Prescriptions  Medication Sig Dispense Refill  . acetaminophen (TYLENOL ARTHRITIS PAIN) 650 MG CR tablet Take 1,300 mg by mouth every 8 (eight) hours as needed for pain.    Marland Kitchen. amLODipine (NORVASC) 2.5 MG tablet Take 1 tablet (2.5 mg total) by mouth 2 (two) times daily. 60 tablet 10  . clopidogrel (PLAVIX) 75 MG tablet Take 1 tablet (75 mg total) by mouth daily. 90 tablet 3   . ezetimibe (ZETIA) 10 MG tablet Take 10 mg by mouth daily.    . furosemide (LASIX) 20 MG tablet Take 20 mg by mouth daily as needed for fluid.    Marland Kitchen. glimepiride (AMARYL) 2 MG tablet Take 2 mg by mouth 2 (two) times daily.    . hydrALAZINE (APRESOLINE) 25 MG tablet Take 1 tablet (25 mg total) by mouth 3 (three) times daily. 270 tablet 3  . insulin glargine (LANTUS) 100 UNIT/ML injection Inject 25-30 Units into the skin at bedtime. Based on sugar levels    . isosorbide mononitrate (IMDUR) 30 MG 24 hr tablet Take 60 mg by mouth in the morning and take 120 mg by mouth at bedtime. 180 tablet 6  . metoprolol succinate (TOPROL-XL) 50 MG 24 hr tablet Take 1 tablet (50 mg total) by mouth 2 (two) times daily. Take with or immediately following a meal. 180 tablet 3  . nitroGLYCERIN (NITROSTAT) 0.4 MG SL tablet Place 0.4 mg under the tongue every 5 (five) minutes as needed for chest pain.    . pantoprazole (PROTONIX) 40 MG tablet Take 1 tablet (40 mg total) by mouth daily. 90 tablet 3  . Polyethyl Glycol-Propyl Glycol (SYSTANE OP) Place 1 drop into both eyes every morning.    . ranolazine (RANEXA) 500 MG 12 hr tablet Take 1 tablet (500 mg total) by mouth 2 (two) times daily. 60 tablet 6  . TRUETEST TEST  test strip 1 each by Other route See admin instructions. Check blood sugar twice daily.     No current facility-administered medications for this visit.    Past Medical History  Diagnosis Date  . CAD (coronary artery disease)     S/P CABG January 2012; had poor target vessels for grafting.  A cath  9/13, LAD 100%, diagonal occluded, diffuse second marginal 90% stenosis, first obtuse marginal occluded, nondominant right coronary occluded, saphenous vein graft to the diagonal occluded, saphenous vein graft to OM 40% stenosis, LIMA to the LAD patent   . Orthostatic hypotension     previous Midodrine therapy  . Obesity   . HLD (hyperlipidemia)     statin intolerant  . Aspirin allergy   . PVD (peripheral  vascular disease)     prior balloon PCI to left leg in [place]  . Hypertension   . Anginal pain   . DVT (deep venous thrombosis) 1970's    LLE  . Pneumonia 2013?  Marland Kitchen Type II diabetes mellitus   . Arthritis     "hands, back" (12/09/2013)    Past Surgical History  Procedure Laterality Date  . Coronary artery bypass graft  03/2010    CABG X3  . Carotid dopplers  03/2010    40-59% bilateral ICA stenosis  . Appendectomy    . Tonsillectomy    . Cholecystectomy  2012  . Cataract extraction w/ intraocular lens  implant, bilateral Bilateral   . Refractive surgery      laser  . Tubal ligation Bilateral   . Angioplasty / stenting femoral Left 03/14/2011  . Cardiac catheterization  03/2010; 2013  . Coronary angioplasty with stent placement  12/09/2013    circumflex was stented 2.75 x 16 overlapping 2.75 x 8 promus drug-eluting stents  . Vitrectomy Right 2015    ROS:  As stated in the HPI and negative for all other systems.  PHYSICAL EXAM BP 120/77 mmHg  Pulse 84  Ht 5\' 6"  (1.676 m)  Wt 214 lb 3.2 oz (97.16 kg)  BMI 34.59 kg/m2 GENERAL:  Well appearing HEENT:  Pupils equal round and reactive, fundi not visualized, oral mucosa unremarkable NECK:  No jugular venous distention, waveform within normal limits, carotid upstroke brisk and symmetric, no bruits, no thyromegaly LYMPHATICS:  No cervical, inguinal adenopathy LUNGS:  Clear to auscultation bilaterally BACK:  No CVA tenderness CHEST:  Well healed sternotomy scar. HEART:  PMI not displaced or sustained,S1 and S2 within normal limits, no S3, no S4, no clicks, no rubs, no murmurs ABD:  Flat, positive bowel sounds normal in frequency in pitch, no bruits, no rebound, no guarding, no midline pulsatile mass, no hepatomegaly, no splenomegaly EXT:  2 plus pulses throughout, mild diffuse edema in hands and feet, no cyanosis no clubbing   ASSESSMENT AND PLAN  CAD - She has severe small vessel disease.  Unfortunately she continues to be  symptomatic. There is a possibility that she has had recurrent stenosis in the recently placed stents. However, there is also a very strong possibility that this is related to her diffuse small vessel disease. She did not stop the twice daily Imdur as I had suggested and I reiterated the need to do this. I'm going to review her cath with Dr. [place] to consider whether there's any possibility of possible revascularization of her chronically occluded right coronary. She is likely to need another catheterization. I will check an echocardiogram as well as I have not done this in several years.  CAROTID ARTERY STENOSIS, BILATERAL -  She has 40 - 59% left and 60-79% left stenosis in Sept and she will have followup again in March.  HYPERLIPIDEMIA -  She has been intolerant of statins unfortunately and she will proceed with diet control.  HTN - Her blood pressure is quite labile.  He complains of blurred vision. I will reduce the hydralazine to half the current dose.

## 2014-01-29 NOTE — Addendum Note (Signed)
Addended by: Vincenza Hews. on: 01/29/2014 01:45 PM   Modules accepted: Orders

## 2014-01-29 NOTE — Patient Instructions (Signed)
Decrease your current dose of hydralazine to 12.5 Mg three times a day  Start taking Imdur 120 mg daily in the AM  Your physician recommends that you schedule a follow-up appointment in: one month with Dr. Antoine Poche  We are ordering an Echo

## 2014-01-30 ENCOUNTER — Ambulatory Visit: Payer: Commercial Managed Care - HMO | Admitting: Cardiology

## 2014-02-03 ENCOUNTER — Telehealth: Payer: Self-pay | Admitting: *Deleted

## 2014-02-03 ENCOUNTER — Ambulatory Visit (HOSPITAL_COMMUNITY)
Admission: RE | Admit: 2014-02-03 | Discharge: 2014-02-03 | Disposition: A | Discharge status: Home / Self Care | Payer: Medicare HMO | Source: Ambulatory Visit | Attending: Cardiology | Admitting: Cardiology

## 2014-02-03 DIAGNOSIS — I2510 Atherosclerotic heart disease of native coronary artery without angina pectoris: Secondary | ICD-10-CM | POA: Insufficient documentation

## 2014-02-03 DIAGNOSIS — I517 Cardiomegaly: Secondary | ICD-10-CM

## 2014-02-03 DIAGNOSIS — I2089 Other forms of angina pectoris: Secondary | ICD-10-CM

## 2014-02-03 DIAGNOSIS — E785 Hyperlipidemia, unspecified: Secondary | ICD-10-CM | POA: Insufficient documentation

## 2014-02-03 DIAGNOSIS — I10 Essential (primary) hypertension: Secondary | ICD-10-CM | POA: Insufficient documentation

## 2014-02-03 DIAGNOSIS — E119 Type 2 diabetes mellitus without complications: Secondary | ICD-10-CM | POA: Diagnosis not present

## 2014-02-03 DIAGNOSIS — I25119 Atherosclerotic heart disease of native coronary artery with unspecified angina pectoris: Secondary | ICD-10-CM

## 2014-02-03 DIAGNOSIS — I208 Other forms of angina pectoris: Secondary | ICD-10-CM

## 2014-02-03 NOTE — Progress Notes (Signed)
2D Echocardiogram Complete.  02/03/2014   [name], RDCS  

## 2014-02-03 NOTE — Telephone Encounter (Signed)
Pt. Was in the office today and wanted to know if she could have a lymes test and a test rocky [place] spotted fever test. It's been two years since her tick bite, she asked her PCP about having the test done and they told her that it should be done by her Cardiologist

## 2014-02-18 ENCOUNTER — Ambulatory Visit: Payer: Medicare HMO | Admitting: Cardiology

## 2014-02-20 ENCOUNTER — Telehealth: Payer: Self-pay | Admitting: Cardiology

## 2014-02-20 ENCOUNTER — Encounter (HOSPITAL_COMMUNITY): Payer: Self-pay | Admitting: Cardiovascular Disease

## 2014-02-20 NOTE — Telephone Encounter (Signed)
Returned call to patient she stated for the past couple of weeks she has been having chest pain off and on.Stated she had episode of chest pain and elevated B/P this morning.Stated B/P was 211/110 pulse 110 took NTG x2 with some relief.B/P at present 164/90 pulse 96.No chest pain at present.Appointment moved up with Dr.Hochrein to 02/24/14 at 10:30 am.Advised to go to ER if needed.Stated she was suppose to have lab work for lyme's disease at PCP.Stated they will need a order.Dr.Kanhkalin [name]'s office called they will call patient to have lab work.

## 2014-02-20 NOTE — Telephone Encounter (Signed)
Pt would like to have a lime disease blood work, She wants this with Dr L.Pt says she have been having a lot of chest pains lately,2 nitro can hardly stop it. Her blood pressure this morning at 4 was 211/110 and pulse rate was 110.[name]

## 2014-02-20 NOTE — Telephone Encounter (Signed)
Received a call from Lurena Joiner with Dr.[name]'s office she stated needed Dr.Hochrein's last office note.Stated they will contact pt to have lab done for lyme's disease.Dr.Hochrein's 01/29/14  Office note faxed to her at fax# [phone].

## 2014-02-21 ENCOUNTER — Ambulatory Visit: Payer: Commercial Managed Care - HMO | Admitting: Cardiology

## 2014-02-24 ENCOUNTER — Telehealth: Payer: Self-pay | Admitting: Cardiology

## 2014-02-24 ENCOUNTER — Encounter: Payer: Self-pay | Admitting: Cardiology

## 2014-02-24 ENCOUNTER — Ambulatory Visit (INDEPENDENT_AMBULATORY_CARE_PROVIDER_SITE_OTHER): Payer: Commercial Managed Care - HMO | Admitting: Cardiology

## 2014-02-24 ENCOUNTER — Encounter: Payer: Self-pay | Admitting: Interventional Cardiology

## 2014-02-24 VITALS — BP 150/74 | HR 84 | Ht 66.0 in | Wt 211.0 lb

## 2014-02-24 DIAGNOSIS — R079 Chest pain, unspecified: Secondary | ICD-10-CM

## 2014-02-24 DIAGNOSIS — R0789 Other chest pain: Secondary | ICD-10-CM

## 2014-02-24 DIAGNOSIS — Z01812 Encounter for preprocedural laboratory examination: Secondary | ICD-10-CM

## 2014-02-24 NOTE — Patient Instructions (Signed)
Your physician recommends that you schedule a follow-up appointment in: About 10 days after your cath with Dr. Antoine Poche  We are scheduling a left HEART CATH  Stop taking your Toprol   Start taking METOPROLOL  Tart 50 mg Three times a day

## 2014-02-24 NOTE — Progress Notes (Signed)
HPI The patient presents for followup of coronary disease.  She was recently hospitalized with unstable angina. Cardiac catheterization. This demonstrated a atent left main coronary artery  With a heavily diseased native left anterior descending artery and its branches. LIMA to LAD was patent. SVG to diagonal was occluded.  There was a 90% lesion in the mid left circumflex artery. Patent SVG to OM, but due to proximal disease in the OM, there was no flow into the distal circumflex system from the bypass graft. The native circumflex was stented 2.75 x 16 overlapping 2.75 x 8 promus drug-eluting stents, postdilated to greater than 3 mm. There was a chronically occluded right coronary artery.  She continues to have symptoms.  She was not taking the meds as I prescribed and in particular she has had no nitrate free interval.  Dr. Swaziland kindly reviewed her films for me and agreed that treatment of the small RCA that was occluded is not an option.      Unfortunately, she continues to have severe chest pain at rest (unstable angina) and she is waking up with chest pain and hypertension. She has had to take NTG very frequently since our last visit.    Allergies  Allergen Reactions  . Bee Venom Anaphylaxis  . Statins Other (See Comments)    Crippling  . Aspirin Hives  . Atorvastatin Other (See Comments)    Arthralgias  . Codeine Hives  . Cortisone Hives  . Demerol Hives  . Meperidine Hives and Nausea And Vomiting  . Meperidine Hcl Hives  . Metformin Other (See Comments)    Intolerance  . Prednisone Hives    Current Outpatient Prescriptions  Medication Sig Dispense Refill  . acetaminophen (TYLENOL ARTHRITIS PAIN) 650 MG CR tablet Take 1,300 mg by mouth every 8 (eight) hours as needed for pain.    Marland Kitchen amLODipine (NORVASC) 2.5 MG tablet Take 1 tablet (2.5 mg total) by mouth 2 (two) times daily. 60 tablet 10  . clopidogrel (PLAVIX) 75 MG tablet Take 1 tablet (75 mg total) by mouth daily. 90  tablet 3  . ezetimibe (ZETIA) 10 MG tablet Take 10 mg by mouth daily.    . furosemide (LASIX) 20 MG tablet Take 20 mg by mouth daily as needed for fluid.    Marland Kitchen glimepiride (AMARYL) 2 MG tablet Take 2 mg by mouth 2 (two) times daily.    . hydrALAZINE (APRESOLINE) 25 MG tablet Take 0.5 tablets (12.5 mg total) by mouth 3 (three) times daily. 270 tablet 3  . insulin glargine (LANTUS) 100 UNIT/ML injection Inject 25-30 Units into the skin at bedtime. Based on sugar levels    . isosorbide mononitrate (IMDUR) 30 MG 24 hr tablet Take 60 mg by mouth in the morning and take 120 mg by mouth at bedtime. 180 tablet 6  . metoprolol succinate (TOPROL-XL) 50 MG 24 hr tablet Take 1 tablet (50 mg total) by mouth 2 (two) times daily. Take with or immediately following a meal. 180 tablet 3  . nitroGLYCERIN (NITROSTAT) 0.4 MG SL tablet Place 1 tablet (0.4 mg total) under the tongue every 5 (five) minutes as needed for chest pain. 25 tablet 1  . pantoprazole (PROTONIX) 40 MG tablet Take 1 tablet (40 mg total) by mouth daily. 90 tablet 3  . Polyethyl Glycol-Propyl Glycol (SYSTANE OP) Place 1 drop into both eyes every morning.    . ranolazine (RANEXA) 500 MG 12 hr tablet Take 1 tablet (500 mg total) by mouth  2 (two) times daily. 60 tablet 6  . TRUETEST TEST test strip 1 each by Other route See admin instructions. Check blood sugar twice daily.     No current facility-administered medications for this visit.    Past Medical History  Diagnosis Date  . CAD (coronary artery disease)     S/P CABG January 2012; had poor target vessels for grafting.  A cath  9/13, LAD 100%, diagonal occluded, diffuse second marginal 90% stenosis, first obtuse marginal occluded, nondominant right coronary occluded, saphenous vein graft to the diagonal occluded, saphenous vein graft to OM 40% stenosis, LIMA to the LAD patent   . Orthostatic hypotension     previous Midodrine therapy  . Obesity   . HLD (hyperlipidemia)     statin intolerant    . Aspirin allergy   . PVD (peripheral vascular disease)     prior balloon PCI to left leg in [place]  . Hypertension   . Anginal pain   . DVT (deep venous thrombosis) 1970's    LLE  . Pneumonia 2013?  Marland Kitchen. Type II diabetes mellitus   . Arthritis     "hands, back" (12/09/2013)    Past Surgical History  Procedure Laterality Date  . Coronary artery bypass graft  03/2010    CABG X3  . Carotid dopplers  03/2010    40-59% bilateral ICA stenosis  . Appendectomy    . Tonsillectomy    . Cholecystectomy  2012  . Cataract extraction w/ intraocular lens  implant, bilateral Bilateral   . Refractive surgery      laser  . Tubal ligation Bilateral   . Angioplasty / stenting femoral Left 03/14/2011  . Cardiac catheterization  03/2010; 2013  . Coronary angioplasty with stent placement  12/09/2013    circumflex was stented 2.75 x 16 overlapping 2.75 x 8 promus drug-eluting stents  . Vitrectomy Right 2015  . Left heart catheterization with coronary/graft angiogram N/A 11/21/2011    Procedure: LEFT HEART CATHETERIZATION WITH Isabel CapriceORONARY/GRAFT ANGIOGRAM;  Surgeon: Kathleene Hazelhristopher D McAlhany, MD;  [facility];  Service: Cardiovascular;  Laterality: N/A;  . Right heart catheterization  11/21/2011    Procedure: RIGHT HEART CATH;  Surgeon: Kathleene Hazelhristopher D McAlhany, MD;  [facility];  Service: Cardiovascular;;  . Left heart catheterization with coronary/graft angiogram N/A 12/09/2013    Procedure: LEFT HEART CATHETERIZATION WITH Isabel CapriceORONARY/GRAFT ANGIOGRAM;  Surgeon: Corky CraftsJayadeep S Varanasi, MD;  [facility];  Service: Cardiovascular;  Laterality: N/A;  . Percutaneous coronary stent intervention (pci-s)  12/09/2013    Procedure: PERCUTANEOUS CORONARY STENT INTERVENTION (PCI-S);  Surgeon: Corky CraftsJayadeep S Varanasi, MD;  [facility];  Service: Cardiovascular;;    ROS:  As stated in the HPI and negative for all other systems.  PHYSICAL EXAM BP 150/74 mmHg  Pulse 84  Ht 5\' 6"  (1.676 m)  Wt  211 lb (95.709 kg)  BMI 34.07 kg/m2 GENERAL:  Well appearing HEENT:  Pupils equal round and reactive, fundi not visualized, oral mucosa unremarkable NECK:  No jugular venous distention, waveform within normal limits, carotid upstroke brisk and symmetric, no bruits, no thyromegaly LYMPHATICS:  No cervical, inguinal adenopathy LUNGS:  Clear to auscultation bilaterally BACK:  No CVA tenderness CHEST:  Well healed sternotomy scar. HEART:  PMI not displaced or sustained,S1 and S2 within normal limits, no S3, no S4, no clicks, no rubs, no murmurs ABD:  Flat, positive bowel sounds normal in frequency in pitch, no bruits, no rebound, no guarding, no midline pulsatile  mass, no hepatomegaly, no splenomegaly EXT:  2 plus pulses throughout, mild diffuse edema in hands and feet, no cyanosis no clubbing  EKG:  Sinus rhythm, rate 83, axis within normal limits, QT slightly increased , no acute ST-T wave changes. Poor anterior R wave progression.   02/24/2014  ASSESSMENT AND PLAN  CAD - She has severe small vessel disease.  Unfortunately she continues to be symptomatic. Repeat cardiac cath is indicated to make sure that the recently placed stents are patent.  I will increase her beta blocker.    The patient understands that risks included but are not limited to stroke (1 in 1000), death (1 in 1000), kidney failure [usually temporary] (1 in 500), bleeding (1 in 200), allergic reaction [possibly serious] (1 in 200).  The patient understands and agrees to proceed.   CAROTID ARTERY STENOSIS, BILATERAL -  She has 40 - 59% left and 60-79% left stenosis in Sept and she will have followup again in March.  HYPERLIPIDEMIA -  She has been intolerant of statins unfortunately and she will proceed with diet control.  HTN - Her blood pressure is quite labile.  I will change the meds as above increasing the beta blocker.

## 2014-02-24 NOTE — Telephone Encounter (Signed)
[name] called in stating that the pt has been waiting at the pharmacy for an hour to receive her Metoprolol prescription and it has not been called in yet. Please call  Thanks

## 2014-02-24 NOTE — Telephone Encounter (Signed)
Script called in

## 2014-03-05 LAB — COMPLETE METABOLIC PANEL WITH GFR
ALT: 12 U/L (ref 0–35)
AST: 14 U/L (ref 0–37)
Albumin: 4.2 g/dL (ref 3.5–5.2)
Alkaline Phosphatase: 40 U/L (ref 39–117)
BUN: 19 mg/dL (ref 6–23)
CO2: 28 mEq/L (ref 19–32)
Calcium: 9.7 mg/dL (ref 8.4–10.5)
Chloride: 104 mEq/L (ref 96–112)
Creat: 0.78 mg/dL (ref 0.50–1.10)
GFR, Est African American: 89 mL/min
GFR, Est Non African American: 77 mL/min
Glucose, Bld: 104 mg/dL — ABNORMAL HIGH (ref 70–99)
Potassium: 4.6 mEq/L (ref 3.5–5.3)
Sodium: 141 mEq/L (ref 135–145)
Total Bilirubin: 0.4 mg/dL (ref 0.2–1.2)
Total Protein: 6.9 g/dL (ref 6.0–8.3)

## 2014-03-05 LAB — URINALYSIS, MICROSCOPIC ONLY
Casts: NONE SEEN
Crystals: NONE SEEN

## 2014-03-05 LAB — CBC
HCT: 40 % (ref 36.0–46.0)
Hemoglobin: 13.3 g/dL (ref 12.0–15.0)
MCH: 29 pg (ref 26.0–34.0)
MCHC: 33.3 g/dL (ref 30.0–36.0)
MCV: 87.3 fL (ref 78.0–100.0)
MPV: 11.5 fL (ref 9.4–12.4)
Platelets: 215 10*3/uL (ref 150–400)
RBC: 4.58 MIL/uL (ref 3.87–5.11)
RDW: 14.6 % (ref 11.5–15.5)
WBC: 5.4 10*3/uL (ref 4.0–10.5)

## 2014-03-05 LAB — APTT: aPTT: 32 seconds (ref 24–37)

## 2014-03-05 LAB — PROTIME-INR
INR: 0.97 (ref <1.50)
Prothrombin Time: 12.9 seconds (ref 11.6–15.2)

## 2014-03-06 ENCOUNTER — Other Ambulatory Visit: Payer: Self-pay | Admitting: Cardiology

## 2014-03-06 ENCOUNTER — Ambulatory Visit: Payer: Commercial Managed Care - HMO | Admitting: Cardiology

## 2014-03-06 MED ORDER — CLOPIDOGREL BISULFATE 75 MG PO TABS
75.0000 mg | ORAL_TABLET | Freq: Every day | ORAL | Status: DC
Start: 2014-03-06 — End: 2015-01-07

## 2014-03-10 NOTE — Progress Notes (Signed)
Pt. Called and stated she has a bad cold and is coughing and feels terrible , and wants to know if we should put off her procedure to another day, her procedure is tomorrow

## 2014-03-11 ENCOUNTER — Ambulatory Visit (HOSPITAL_COMMUNITY)
Admission: RE | Admit: 2014-03-11 | Payer: Medicare HMO | Source: Ambulatory Visit | Admitting: Interventional Cardiology

## 2014-03-11 ENCOUNTER — Encounter (HOSPITAL_COMMUNITY): Admission: RE | Payer: Self-pay | Source: Ambulatory Visit

## 2014-03-11 SURGERY — LEFT HEART CATHETERIZATION WITH CORONARY/GRAFT ANGIOGRAM
Anesthesia: LOCAL

## 2014-03-13 ENCOUNTER — Ambulatory Visit (HOSPITAL_COMMUNITY)
Admission: RE | Admit: 2014-03-13 | Discharge: 2014-03-13 | Disposition: A | Discharge status: Home / Self Care | Payer: Commercial Managed Care - HMO | Source: Ambulatory Visit | Attending: Interventional Cardiology | Admitting: Interventional Cardiology

## 2014-03-13 ENCOUNTER — Encounter (HOSPITAL_COMMUNITY)
Admission: RE | Disposition: A | Discharge status: Home / Self Care | Payer: Self-pay | Source: Ambulatory Visit | Attending: Interventional Cardiology

## 2014-03-13 ENCOUNTER — Encounter (HOSPITAL_COMMUNITY): Payer: Self-pay | Admitting: Cardiology

## 2014-03-13 DIAGNOSIS — Z8673 Personal history of transient ischemic attack (TIA), and cerebral infarction without residual deficits: Secondary | ICD-10-CM | POA: Diagnosis not present

## 2014-03-13 DIAGNOSIS — Z951 Presence of aortocoronary bypass graft: Secondary | ICD-10-CM | POA: Diagnosis not present

## 2014-03-13 DIAGNOSIS — Z794 Long term (current) use of insulin: Secondary | ICD-10-CM | POA: Diagnosis not present

## 2014-03-13 DIAGNOSIS — E119 Type 2 diabetes mellitus without complications: Secondary | ICD-10-CM | POA: Diagnosis not present

## 2014-03-13 DIAGNOSIS — I10 Essential (primary) hypertension: Secondary | ICD-10-CM | POA: Diagnosis not present

## 2014-03-13 DIAGNOSIS — I739 Peripheral vascular disease, unspecified: Secondary | ICD-10-CM | POA: Diagnosis not present

## 2014-03-13 DIAGNOSIS — I2510 Atherosclerotic heart disease of native coronary artery without angina pectoris: Secondary | ICD-10-CM | POA: Diagnosis not present

## 2014-03-13 DIAGNOSIS — E785 Hyperlipidemia, unspecified: Secondary | ICD-10-CM | POA: Diagnosis not present

## 2014-03-13 DIAGNOSIS — E669 Obesity, unspecified: Secondary | ICD-10-CM | POA: Insufficient documentation

## 2014-03-13 DIAGNOSIS — I25118 Atherosclerotic heart disease of native coronary artery with other forms of angina pectoris: Secondary | ICD-10-CM

## 2014-03-13 DIAGNOSIS — I200 Unstable angina: Secondary | ICD-10-CM | POA: Diagnosis present

## 2014-03-13 DIAGNOSIS — I209 Angina pectoris, unspecified: Secondary | ICD-10-CM | POA: Diagnosis not present

## 2014-03-13 HISTORY — PX: LEFT HEART CATHETERIZATION WITH CORONARY/GRAFT ANGIOGRAM: SHX5450

## 2014-03-13 LAB — GLUCOSE, CAPILLARY
Glucose-Capillary: 107 mg/dL — ABNORMAL HIGH (ref 70–99)
Glucose-Capillary: 106 mg/dL — ABNORMAL HIGH (ref 70–99)
Glucose-Capillary: 66 mg/dL — ABNORMAL LOW (ref 70–99)

## 2014-03-13 SURGERY — LEFT HEART CATHETERIZATION WITH CORONARY/GRAFT ANGIOGRAM
Anesthesia: LOCAL

## 2014-03-13 MED ORDER — VERAPAMIL HCL 2.5 MG/ML IV SOLN
INTRAVENOUS | Status: AC
  Filled 2014-03-13: qty 2

## 2014-03-13 MED ORDER — NITROGLYCERIN 1 MG/10 ML FOR IR/CATH LAB
INTRA_ARTERIAL | Status: AC
  Filled 2014-03-13: qty 10

## 2014-03-13 MED ORDER — AMLODIPINE BESYLATE 2.5 MG PO TABS: 5.0000 mg | ORAL_TABLET | Freq: Two times a day (BID) | ORAL | Status: DC

## 2014-03-13 MED ORDER — SODIUM CHLORIDE 0.9 % IJ SOLN: 3.0000 mL | INTRAMUSCULAR | Status: DC | PRN

## 2014-03-13 MED ORDER — LIDOCAINE HCL (PF) 1 % IJ SOLN
INTRAMUSCULAR | Status: AC
  Filled 2014-03-13: qty 30

## 2014-03-13 MED ORDER — SODIUM CHLORIDE 0.9 % IV SOLN: 1.0000 mL/kg/h | INTRAVENOUS | Status: DC

## 2014-03-13 MED ORDER — MIDAZOLAM HCL 2 MG/2ML IJ SOLN
INTRAMUSCULAR | Status: AC
  Filled 2014-03-13: qty 2

## 2014-03-13 MED ORDER — SODIUM CHLORIDE 0.9 % IJ SOLN: 3.0000 mL | Freq: Two times a day (BID) | INTRAMUSCULAR | Status: DC

## 2014-03-13 MED ORDER — HEPARIN (PORCINE) IN NACL 2-0.9 UNIT/ML-% IJ SOLN
INTRAMUSCULAR | Status: AC
  Filled 2014-03-13: qty 1500

## 2014-03-13 MED ORDER — HYDRALAZINE HCL 20 MG/ML IJ SOLN: 10.0000 mg | Freq: Four times a day (QID) | INTRAMUSCULAR | Status: DC | PRN

## 2014-03-13 MED ORDER — HEPARIN SODIUM (PORCINE) 1000 UNIT/ML IJ SOLN
INTRAMUSCULAR | Status: AC
  Filled 2014-03-13: qty 1

## 2014-03-13 MED ORDER — FENTANYL CITRATE 0.05 MG/ML IJ SOLN
INTRAMUSCULAR | Status: AC
  Filled 2014-03-13: qty 2

## 2014-03-13 MED ORDER — SODIUM CHLORIDE 0.9 % IV SOLN: INTRAVENOUS | Status: DC

## 2014-03-13 MED ORDER — SODIUM CHLORIDE 0.9 % IV SOLN: 250.0000 mL | INTRAVENOUS | Status: DC | PRN

## 2014-03-13 MED ORDER — HYDRALAZINE HCL 25 MG PO TABS: 25.0000 mg | ORAL_TABLET | Freq: Three times a day (TID) | ORAL | Status: DC

## 2014-03-13 NOTE — Discharge Instructions (Addendum)
Increase amlodipine to 5 mg twice a day.  Make sure you are taking hydralazine 25 mg three times a day.   Radial Site Care Refer to this sheet in the next few weeks. These instructions provide you with information on caring for yourself after your procedure. Your caregiver may also give you more specific instructions. Your treatment has been planned according to current medical practices, but problems sometimes occur. Call your caregiver if you have any problems or questions after your procedure. HOME CARE INSTRUCTIONS  You may shower the day after the procedure.Remove the bandage (dressing) and gently wash the site with plain soap and water.Gently pat the site dry.  Do not apply powder or lotion to the site.  Do not submerge the affected site in water for 3 to 5 days.  Inspect the site at least twice daily.  Do not flex or bend the affected arm for 24 hours.  No lifting over 5 pounds (2.3 kg) for 5 days after your procedure.  Do not drive home if you are discharged the same day of the procedure. Have someone else drive you.  You may drive 24 hours after the procedure unless otherwise instructed by your caregiver.  Do not operate machinery or power tools for 24 hours.  A responsible adult should be with you for the first 24 hours after you arrive home. What to expect:  Any bruising will usually fade within 1 to 2 weeks.  Blood that collects in the tissue (hematoma) may be painful to the touch. It should usually decrease in size and tenderness within 1 to 2 weeks. SEEK IMMEDIATE MEDICAL CARE IF:  You have unusual pain at the radial site.  You have redness, warmth, swelling, or pain at the radial site.  You have drainage (other than a small amount of blood on the dressing).  You have chills.  You have a fever or persistent symptoms for more than 72 hours.  You have a fever and your symptoms suddenly get worse.  Your arm becomes pale, cool, tingly, or numb.  You have  heavy bleeding from the site. Hold pressure on the site and call 911. Document Released: 04/02/2010 Document Revised: 05/23/2011 Document Reviewed: 04/02/2010 [facility] Patient Information 2015 [place], [place]. This information is not intended to replace advice given to you by your health care provider. Make sure you discuss any questions you have with your health care provider.

## 2014-03-13 NOTE — Progress Notes (Signed)
Ambulated to bathroom without incident,

## 2014-03-13 NOTE — H&P (View-Only) (Signed)
HPI The patient presents for followup of coronary disease.  She was recently hospitalized with unstable angina. Cardiac catheterization. This demonstrated a atent left main coronary artery  With a heavily diseased native left anterior descending artery and its branches. LIMA to LAD was patent. SVG to diagonal was occluded.  There was a 90% lesion in the mid left circumflex artery. Patent SVG to OM, but due to proximal disease in the OM, there was no flow into the distal circumflex system from the bypass graft. The native circumflex was stented 2.75 x 16 overlapping 2.75 x 8 promus drug-eluting stents, postdilated to greater than 3 mm. There was a chronically occluded right coronary artery.  She continues to have symptoms.  She was not taking the meds as I prescribed and in particular she has had no nitrate free interval.  Dr. Swaziland kindly reviewed her films for me and agreed that treatment of the small RCA that was occluded is not an option.      Unfortunately, she continues to have severe chest pain at rest (unstable angina) and she is waking up with chest pain and hypertension. She has had to take NTG very frequently since our last visit.    Allergies  Allergen Reactions  . Bee Venom Anaphylaxis  . Statins Other (See Comments)    Crippling  . Aspirin Hives  . Atorvastatin Other (See Comments)    Arthralgias  . Codeine Hives  . Cortisone Hives  . Demerol Hives  . Meperidine Hives and Nausea And Vomiting  . Meperidine Hcl Hives  . Metformin Other (See Comments)    Intolerance  . Prednisone Hives    Current Outpatient Prescriptions  Medication Sig Dispense Refill  . acetaminophen (TYLENOL ARTHRITIS PAIN) 650 MG CR tablet Take 1,300 mg by mouth every 8 (eight) hours as needed for pain.    Marland Kitchen amLODipine (NORVASC) 2.5 MG tablet Take 1 tablet (2.5 mg total) by mouth 2 (two) times daily. 60 tablet 10  . clopidogrel (PLAVIX) 75 MG tablet Take 1 tablet (75 mg total) by mouth daily. 90  tablet 3  . ezetimibe (ZETIA) 10 MG tablet Take 10 mg by mouth daily.    . furosemide (LASIX) 20 MG tablet Take 20 mg by mouth daily as needed for fluid.    Marland Kitchen glimepiride (AMARYL) 2 MG tablet Take 2 mg by mouth 2 (two) times daily.    . hydrALAZINE (APRESOLINE) 25 MG tablet Take 0.5 tablets (12.5 mg total) by mouth 3 (three) times daily. 270 tablet 3  . insulin glargine (LANTUS) 100 UNIT/ML injection Inject 25-30 Units into the skin at bedtime. Based on sugar levels    . isosorbide mononitrate (IMDUR) 30 MG 24 hr tablet Take 60 mg by mouth in the morning and take 120 mg by mouth at bedtime. 180 tablet 6  . metoprolol succinate (TOPROL-XL) 50 MG 24 hr tablet Take 1 tablet (50 mg total) by mouth 2 (two) times daily. Take with or immediately following a meal. 180 tablet 3  . nitroGLYCERIN (NITROSTAT) 0.4 MG SL tablet Place 1 tablet (0.4 mg total) under the tongue every 5 (five) minutes as needed for chest pain. 25 tablet 1  . pantoprazole (PROTONIX) 40 MG tablet Take 1 tablet (40 mg total) by mouth daily. 90 tablet 3  . Polyethyl Glycol-Propyl Glycol (SYSTANE OP) Place 1 drop into both eyes every morning.    . ranolazine (RANEXA) 500 MG 12 hr tablet Take 1 tablet (500 mg total) by mouth  2 (two) times daily. 60 tablet 6  . TRUETEST TEST test strip 1 each by Other route See admin instructions. Check blood sugar twice daily.     No current facility-administered medications for this visit.    Past Medical History  Diagnosis Date  . CAD (coronary artery disease)     S/P CABG January 2012; had poor target vessels for grafting.  A cath  9/13, LAD 100%, diagonal occluded, diffuse second marginal 90% stenosis, first obtuse marginal occluded, nondominant right coronary occluded, saphenous vein graft to the diagonal occluded, saphenous vein graft to OM 40% stenosis, LIMA to the LAD patent   . Orthostatic hypotension     previous Midodrine therapy  . Obesity   . HLD (hyperlipidemia)     statin intolerant    . Aspirin allergy   . PVD (peripheral vascular disease)     prior balloon PCI to left leg in [place]  . Hypertension   . Anginal pain   . DVT (deep venous thrombosis) 1970's    LLE  . Pneumonia 2013?  Marland Kitchen. Type II diabetes mellitus   . Arthritis     "hands, back" (12/09/2013)    Past Surgical History  Procedure Laterality Date  . Coronary artery bypass graft  03/2010    CABG X3  . Carotid dopplers  03/2010    40-59% bilateral ICA stenosis  . Appendectomy    . Tonsillectomy    . Cholecystectomy  2012  . Cataract extraction w/ intraocular lens  implant, bilateral Bilateral   . Refractive surgery      laser  . Tubal ligation Bilateral   . Angioplasty / stenting femoral Left 03/14/2011  . Cardiac catheterization  03/2010; 2013  . Coronary angioplasty with stent placement  12/09/2013    circumflex was stented 2.75 x 16 overlapping 2.75 x 8 promus drug-eluting stents  . Vitrectomy Right 2015  . Left heart catheterization with coronary/graft angiogram N/A 11/21/2011    Procedure: LEFT HEART CATHETERIZATION WITH Isabel CapriceORONARY/GRAFT ANGIOGRAM;  Surgeon: Kathleene Hazelhristopher D McAlhany, MD;  [facility];  Service: Cardiovascular;  Laterality: N/A;  . Right heart catheterization  11/21/2011    Procedure: RIGHT HEART CATH;  Surgeon: Kathleene Hazelhristopher D McAlhany, MD;  [facility];  Service: Cardiovascular;;  . Left heart catheterization with coronary/graft angiogram N/A 12/09/2013    Procedure: LEFT HEART CATHETERIZATION WITH Isabel CapriceORONARY/GRAFT ANGIOGRAM;  Surgeon: Corky CraftsJayadeep S Varanasi, MD;  [facility];  Service: Cardiovascular;  Laterality: N/A;  . Percutaneous coronary stent intervention (pci-s)  12/09/2013    Procedure: PERCUTANEOUS CORONARY STENT INTERVENTION (PCI-S);  Surgeon: Corky CraftsJayadeep S Varanasi, MD;  [facility];  Service: Cardiovascular;;    ROS:  As stated in the HPI and negative for all other systems.  PHYSICAL EXAM BP 150/74 mmHg  Pulse 84  Ht 5\' 6"  (1.676 m)  Wt  211 lb (95.709 kg)  BMI 34.07 kg/m2 GENERAL:  Well appearing HEENT:  Pupils equal round and reactive, fundi not visualized, oral mucosa unremarkable NECK:  No jugular venous distention, waveform within normal limits, carotid upstroke brisk and symmetric, no bruits, no thyromegaly LYMPHATICS:  No cervical, inguinal adenopathy LUNGS:  Clear to auscultation bilaterally BACK:  No CVA tenderness CHEST:  Well healed sternotomy scar. HEART:  PMI not displaced or sustained,S1 and S2 within normal limits, no S3, no S4, no clicks, no rubs, no murmurs ABD:  Flat, positive bowel sounds normal in frequency in pitch, no bruits, no rebound, no guarding, no midline pulsatile  mass, no hepatomegaly, no splenomegaly EXT:  2 plus pulses throughout, mild diffuse edema in hands and feet, no cyanosis no clubbing  EKG:  Sinus rhythm, rate 83, axis within normal limits, QT slightly increased , no acute ST-T wave changes. Poor anterior R wave progression.   02/24/2014  ASSESSMENT AND PLAN  CAD - She has severe small vessel disease.  Unfortunately she continues to be symptomatic. Repeat cardiac cath is indicated to make sure that the recently placed stents are patent.  I will increase her beta blocker.    The patient understands that risks included but are not limited to stroke (1 in 1000), death (1 in 1000), kidney failure [usually temporary] (1 in 500), bleeding (1 in 200), allergic reaction [possibly serious] (1 in 200).  The patient understands and agrees to proceed.   CAROTID ARTERY STENOSIS, BILATERAL -  She has 40 - 59% left and 60-79% left stenosis in Sept and she will have followup again in March.  HYPERLIPIDEMIA -  She has been intolerant of statins unfortunately and she will proceed with diet control.  HTN - Her blood pressure is quite labile.  I will change the meds as above increasing the beta blocker.

## 2014-03-13 NOTE — Progress Notes (Signed)
Pt resting comfortably post eating, daughter bedside.

## 2014-03-13 NOTE — Progress Notes (Signed)
CBG noted to be 66. Pt eating [place] sandwich tray and drinking gingerale without difficulty. Will monitor.

## 2014-03-13 NOTE — Interval H&P Note (Signed)
History and Physical Interval Note:  03/13/2014 1:02 PM  [name]  has presented today for surgery, with the diagnosis of angina  The various methods of treatment have been discussed with the patient and family. After consideration of risks, benefits and other options for treatment, the patient has consented to  Procedure(s): LEFT HEART CATHETERIZATION WITH CORONARY/GRAFT ANGIOGRAM (N/A) as a surgical intervention .  The patient's history has been reviewed, patient examined, no change in status, stable for surgery.  I have reviewed the patient's chart and labs.  Questions were answered to the patient's satisfaction.   Cath Lab Visit (complete for each Cath Lab visit)  Clinical Evaluation Leading to the Procedure:   ACS: Yes.    Non-ACS:    Anginal Classification: CCS IV  Anti-ischemic medical therapy: Maximal Therapy (2 or more classes of medications)  Non-Invasive Test Results: No non-invasive testing performed  Prior CABG: Previous CABG        Theron Arista [facility] 03/13/2014 1:03 PM

## 2014-03-13 NOTE — CV Procedure (Signed)
    Cardiac Catheterization Procedure Note  Name: [name] MRN: [id number] DOB: [birth year]  Procedure: Left Heart Cath, Selective Coronary Angiography, SVG and LIMA angiography, LV angiography, abdominal aortography  Indication: 68 yo WF with history of CAD s/p CABG and prior stent of the LCx in September 2015 presents with refractory angina associated with poorly controlled HTN.    Procedural Details: The left  wrist was prepped, draped, and anesthetized with 1% lidocaine. Using the modified Seldinger technique, a 6 French slender sheath was introduced into the left radial artery under ultrasound guidance. 3 mg of verapamil was administered through the sheath, weight-based unfractionated heparin was administered intravenously. Standard Judkins catheters were used for selective coronary angiography and left ventriculography. An LCB catheter was used for the SVG to OM.Catheter exchanges were performed over an exchange length guidewire. There were no immediate procedural complications. A TR band was used for radial hemostasis at the completion of the procedure.  The patient was transferred to the post catheterization recovery area for further monitoring.  Procedural Findings: Hemodynamics: AO 143/80 mean 110 mm Hg LV 155/16 mm Hg  Coronary angiography: Coronary dominance: right  Left mainstem: Normal  Left anterior descending (LAD): There is 80-90% proximal stenosis followed by 100% occlusion after the first septal perforator.  Left circumflex (LCx): The stents in the mid LCx are widely patent. The first OM is occluded. The second OM is a small vessel and is subtotally occluded at the origin. It is small and diffusely diseased. The terminal PL branch is without significant disease.  Right coronary artery (RCA): The RCA is occluded proximally. It is very small and diffusely diseased.  The SVG to the diagonal is occluded.  The SVG to the first OM is patent.   The LIMA to the LAD is  widely patent.   Left ventriculography: Left ventricular systolic function is good, there is minimal inferior hypokinesis. LVEF is estimated at 55-65%, there is no significant mitral regurgitation   Abdominal aortic angiography demonstrates normal Aortic size. The renal arteries are normal bilaterally.  Final Conclusions:   1. 3 vessel obstructive CAD 2. Patent LIMA to the LAD 3. Patent SVG to the OM1 4. Occluded SVG to the diagonal. 5. Patent stents in the mid LCx 6. Good LV function 7. Normal renal arteries.   Recommendations: Continue medical therapy. I think her angina is related to poorly controlled HTN and small vessel CAD. There are no targets for PCI. Will increase amlodipine to 5 mg bid and follow up in the office.   Peter Swaziland, MDFACC  03/13/2014, 2:01 PM

## 2014-03-17 ENCOUNTER — Telehealth: Payer: Self-pay | Admitting: Cardiology

## 2014-03-17 NOTE — Telephone Encounter (Signed)
See if this pt. Can come in Friday the 8th

## 2014-03-17 NOTE — Telephone Encounter (Signed)
New problem   Pt was told to come in to see Dr Antoine Poche 7-10 days eph. Please advise pt there were no appts avail in that time frame.

## 2014-03-18 ENCOUNTER — Other Ambulatory Visit: Payer: Commercial Managed Care - HMO

## 2014-03-19 ENCOUNTER — Telehealth: Payer: Self-pay | Admitting: Cardiology

## 2014-03-19 ENCOUNTER — Other Ambulatory Visit: Payer: Self-pay | Admitting: Cardiology

## 2014-03-19 NOTE — Telephone Encounter (Signed)
Please call,pt she has blood in her urine,burnining when she urinates and urinating quite a bit. Please call to advise.

## 2014-03-19 NOTE — Telephone Encounter (Signed)
Spoke to patient. She states she had a cath on  the 03/14/15.  She states a couple days after leaving the hospital , she thinks she may have UTI. RN asked if she contacted her primary doctor, patient states no she called this office because Dr Antoine Poche did procedure prior to her condition. RN informed patient - for patient to contact primary , Dr Antoine Poche is not in office today. She has an appointment with him on 03/21/14. She verbalized understanding.

## 2014-03-21 ENCOUNTER — Encounter: Payer: Self-pay | Admitting: Cardiology

## 2014-03-21 ENCOUNTER — Ambulatory Visit (INDEPENDENT_AMBULATORY_CARE_PROVIDER_SITE_OTHER): Payer: Commercial Managed Care - HMO | Admitting: Cardiology

## 2014-03-21 VITALS — BP 148/78 | HR 67 | Ht 66.0 in | Wt 211.0 lb

## 2014-03-21 DIAGNOSIS — I25119 Atherosclerotic heart disease of native coronary artery with unspecified angina pectoris: Secondary | ICD-10-CM

## 2014-03-21 DIAGNOSIS — I200 Unstable angina: Secondary | ICD-10-CM

## 2014-03-21 DIAGNOSIS — I951 Orthostatic hypotension: Secondary | ICD-10-CM

## 2014-03-21 MED ORDER — HYDRALAZINE HCL 50 MG PO TABS: 50.0000 mg | ORAL_TABLET | Freq: Three times a day (TID) | ORAL | Status: DC

## 2014-03-21 MED ORDER — ISOSORBIDE MONONITRATE ER 60 MG PO TB24: 60.0000 mg | ORAL_TABLET | Freq: Every day | ORAL | Status: DC

## 2014-03-21 NOTE — Progress Notes (Signed)
HPI The patient presents for followup of coronary disease.  She was recently hospitalized with unstable angina. Cardiac catheterization. This demonstrated a atent left main coronary artery  With a heavily diseased native left anterior descending artery and its branches. LIMA to LAD was patent. SVG to diagonal was occluded.  There was a 90% lesion in the mid left circumflex artery. Patent SVG to OM, but due to proximal disease in the OM, there was no flow into the distal circumflex system from the bypass graft. The native circumflex was stented 2.75 x 16 overlapping 2.75 x 8 promus drug-eluting stents, postdilated to greater than 3 mm. There was a chronically occluded right coronary artery.  I had her have a repeat catheterization in New Year's Eve. She had patent stents and anatomy is otherwise unchanged area medical management was suggested.  Unfortunately, she continues to have severe chest pain at rest (unstable angina) and she is waking up with chest pain and hypertension. She has had to take NTG very frequently again since our last visit.    She reports that her BPs have been very elevated and she has not had any low BPs.    Allergies  Allergen Reactions  . Bee Venom Anaphylaxis  . Statins Other (See Comments)    Crippling  . Aspirin Hives  . Atorvastatin Other (See Comments)    Arthralgias  . Codeine Hives  . Cortisone Hives  . Demerol Hives  . Meperidine Hives and Nausea And Vomiting  . Meperidine Hcl Hives  . Metformin Other (See Comments)    Intolerance  . Prednisone Hives    Current Outpatient Prescriptions  Medication Sig Dispense Refill  . acetaminophen (TYLENOL ARTHRITIS PAIN) 650 MG CR tablet Take 1,300 mg by mouth every 8 (eight) hours as needed for pain.    Marland Kitchen amLODipine (NORVASC) 2.5 MG tablet Take 2 tablets (5 mg total) by mouth 2 (two) times daily. 60 tablet 10  . clopidogrel (PLAVIX) 75 MG tablet Take 1 tablet (75 mg total) by mouth daily. 90 tablet 3  .  furosemide (LASIX) 20 MG tablet Take 20 mg by mouth daily as needed for fluid.    . hydrALAZINE (APRESOLINE) 25 MG tablet Take 1 tablet (25 mg total) by mouth 3 (three) times daily. 270 tablet 3  . insulin glargine (LANTUS) 100 UNIT/ML injection Inject 25-30 Units into the skin at bedtime. Based on sugar levels    . isosorbide mononitrate (IMDUR) 30 MG 24 hr tablet Take 60 mg by mouth in the morning and take 120 mg by mouth at bedtime. 180 tablet 6  . metoprolol (LOPRESSOR) 50 MG tablet Take 50 mg by mouth 3 (three) times daily.    Marland Kitchen NITROSTAT 0.4 MG SL tablet PLACE 1 TABLET UNDER TONGUE EVERY 5 MIN AS NEEDED FOR CHEST PAIN IF NO RELIEF IN15 MIN CALL 911 (MAX 3 TABS) 25 tablet 3  . pantoprazole (PROTONIX) 40 MG tablet Take 1 tablet (40 mg total) by mouth daily. 90 tablet 3  . Polyethyl Glycol-Propyl Glycol (SYSTANE OP) Place 1 drop into both eyes every morning.    . TRUETEST TEST test strip 1 each by Other route See admin instructions. Check blood sugar twice daily.    . ciprofloxacin (CIPRO) 500 MG tablet      No current facility-administered medications for this visit.    Past Medical History  Diagnosis Date  . CAD (coronary artery disease)     S/P CABG January 2012; had poor target vessels  for grafting.  A cath  9/13, LAD 100%, diagonal occluded, diffuse second marginal 90% stenosis, first obtuse marginal occluded, nondominant right coronary occluded, saphenous vein graft to the diagonal occluded, saphenous vein graft to OM 40% stenosis, LIMA to the LAD patent   . Orthostatic hypotension     previous Midodrine therapy  . Obesity   . HLD (hyperlipidemia)     statin intolerant  . Aspirin allergy   . PVD (peripheral vascular disease)     prior balloon PCI to left leg in [place]  . Hypertension   . Anginal pain   . DVT (deep venous thrombosis) 1970's    LLE  . Pneumonia 2013?  Marland Kitchen Type II diabetes mellitus   . Arthritis     "hands, back" (12/09/2013)    Past Surgical History    Procedure Laterality Date  . Coronary artery bypass graft  03/2010    CABG X3  . Carotid dopplers  03/2010    40-59% bilateral ICA stenosis  . Appendectomy    . Tonsillectomy    . Cholecystectomy  2012  . Cataract extraction w/ intraocular lens  implant, bilateral Bilateral   . Refractive surgery      laser  . Tubal ligation Bilateral   . Angioplasty / stenting femoral Left 03/14/2011  . Cardiac catheterization  03/2010; 2013  . Coronary angioplasty with stent placement  12/09/2013    circumflex was stented 2.75 x 16 overlapping 2.75 x 8 promus drug-eluting stents  . Vitrectomy Right 2015  . Left heart catheterization with coronary/graft angiogram N/A 11/21/2011    Procedure: LEFT HEART CATHETERIZATION WITH Isabel Caprice;  Surgeon: Kathleene Hazel, MD;  Location: [facility] CATH LAB;  Service: Cardiovascular;  Laterality: N/A;  . Right heart catheterization  11/21/2011    Procedure: RIGHT HEART CATH;  Surgeon: Kathleene Hazel, MD;  Location: [facility] CATH LAB;  Service: Cardiovascular;;  . Left heart catheterization with coronary/graft angiogram N/A 12/09/2013    Procedure: LEFT HEART CATHETERIZATION WITH Isabel Caprice;  Surgeon: Corky Crafts, MD;  Location: [facility] CATH LAB;  Service: Cardiovascular;  Laterality: N/A;  . Percutaneous coronary stent intervention (pci-s)  12/09/2013    Procedure: PERCUTANEOUS CORONARY STENT INTERVENTION (PCI-S);  Surgeon: Corky Crafts, MD;  Location: [facility] CATH LAB;  Service: Cardiovascular;;  . Left heart catheterization with coronary/graft angiogram N/A 03/13/2014    Procedure: LEFT HEART CATHETERIZATION WITH Isabel Caprice;  Surgeon: Peter M Swaziland, MD;  Location: [facility] CATH LAB;  Service: Cardiovascular;  Laterality: N/A;    ROS:  As stated in the HPI and negative for all other systems.  PHYSICAL EXAM BP 148/78 mmHg  Pulse 67  Ht  (1.676 m)  Wt 211 lb (95.709 kg)  BMI 34.07 kg/m2 GENERAL:  Well appearing HEENT:   Pupils equal round and reactive, fundi not visualized, oral mucosa unremarkable NECK:  No jugular venous distention, waveform within normal limits, carotid upstroke brisk and symmetric, no bruits, no thyromegaly LYMPHATICS:  No cervical, inguinal adenopathy LUNGS:  Clear to auscultation bilaterally BACK:  No CVA tenderness CHEST:  Well healed sternotomy scar. HEART:  PMI not displaced or sustained,S1 and S2 within normal limits, no S3, no S4, no clicks, no rubs, no murmurs ABD:  Flat, positive bowel sounds normal in frequency in pitch, no bruits, no rebound, no guarding, no midline pulsatile mass, no hepatomegaly, no splenomegaly EXT:  2 plus pulses throughout, mild diffuse edema in hands and feet, no cyanosis no clubbing   ASSESSMENT AND PLAN  CAD - Her stents were patent on recent cath. She has diffuse native vessel disease with continued angina. Therefore, we will continue with aggressive medical management. I will increase her Imdur 180 mg daily. She will have her other meds as below.  CAROTID ARTERY STENOSIS, BILATERAL -  She has 40 - 59% left and 60-79% left stenosis in Sept and she will have followup again in March.  HYPERLIPIDEMIA -  She has been intolerant of statins unfortunately and she will proceed with diet control.  HTN - Her pain seems to correlate with increased pressures. She's not having as much orthostasis. Therefore, she will increase her hydralazine to 50 mg 3 times a day.

## 2014-03-21 NOTE — Patient Instructions (Signed)
Your physician recommends that you schedule a follow-up appointment in: 6 weeks with Dr. Antoine Poche  We have increased your Imdur to 180 mg  We have increased your hydralazine to 50 mg three times a day

## 2014-04-21 ENCOUNTER — Telehealth: Payer: Self-pay

## 2014-04-21 ENCOUNTER — Other Ambulatory Visit: Payer: Self-pay

## 2014-04-21 ENCOUNTER — Ambulatory Visit: Payer: Commercial Managed Care - HMO | Admitting: Cardiology

## 2014-04-21 MED ORDER — NITROGLYCERIN 0.4 MG SL SUBL: SUBLINGUAL_TABLET | SUBLINGUAL | Status: DC

## 2014-04-21 NOTE — Telephone Encounter (Signed)
Yes - thank you

## 2014-05-02 ENCOUNTER — Ambulatory Visit (INDEPENDENT_AMBULATORY_CARE_PROVIDER_SITE_OTHER): Payer: Commercial Managed Care - HMO | Admitting: Cardiology

## 2014-05-02 ENCOUNTER — Encounter: Payer: Self-pay | Admitting: Cardiology

## 2014-05-02 VITALS — BP 110/60 | HR 80 | Ht 66.0 in | Wt 209.6 lb

## 2014-05-02 DIAGNOSIS — I200 Unstable angina: Secondary | ICD-10-CM

## 2014-05-02 DIAGNOSIS — I25709 Atherosclerosis of coronary artery bypass graft(s), unspecified, with unspecified angina pectoris: Secondary | ICD-10-CM

## 2014-05-02 MED ORDER — HYDRALAZINE HCL 50 MG PO TABS: 50.0000 mg | ORAL_TABLET | Freq: Three times a day (TID) | ORAL | Status: DC

## 2014-05-02 MED ORDER — ISOSORBIDE MONONITRATE ER 120 MG PO TB24
240.0000 mg | ORAL_TABLET | Freq: Every day | ORAL | Status: DC
Start: 2014-05-02 — End: 2014-06-30

## 2014-05-02 MED ORDER — HYDRALAZINE HCL 50 MG PO TABS: 50.0000 mg | ORAL_TABLET | Freq: Four times a day (QID) | ORAL | Status: DC

## 2014-05-02 MED ORDER — FUROSEMIDE 20 MG PO TABS: 20.0000 mg | ORAL_TABLET | Freq: Every day | ORAL | Status: DC | PRN

## 2014-05-02 NOTE — Progress Notes (Signed)
HPI The patient presents for followup of coronary disease.  She was recently hospitalized with unstable angina. Cardiac catheterization. This demonstrated a atent left main coronary artery  With a heavily diseased native left anterior descending artery and its branches. LIMA to LAD was patent. SVG to diagonal was occluded.  There was a 90% lesion in the mid left circumflex artery. Patent SVG to OM, but due to proximal disease in the OM, there was no flow into the distal circumflex system from the bypass graft. The native circumflex was stented 2.75 x 16 overlapping 2.75 x 8 promus drug-eluting stents, postdilated to greater than 3 mm. There was a chronically occluded right coronary artery.  I had her have a repeat catheterization in New Year's Eve. She had patent stents and anatomy is otherwise unchanged area medical management was suggested.  At the last visit I increased her Imdur to 180 mg daily.  To better control her blood pressure which might be contributing I increased the hydralazine to 50 mg 3 times daily.  She unfortunately continues to have chest discomfort particularly when her blood pressure goes up. It went up last night and she had some severe pain. She at times feels okay and does have low blood pressures. Her blood pressure is quite labile.   Allergies  Allergen Reactions  . Bee Venom Anaphylaxis  . Statins Other (See Comments)    Crippling  . Aspirin Hives  . Atorvastatin Other (See Comments)    Arthralgias  . Codeine Hives  . Cortisone Hives  . Demerol Hives  . Meperidine Hives and Nausea And Vomiting  . Meperidine Hcl Hives  . Metformin Other (See Comments)    Intolerance  . Prednisone Hives    Current Outpatient Prescriptions  Medication Sig Dispense Refill  . acetaminophen (TYLENOL ARTHRITIS PAIN) 650 MG CR tablet Take 1,300 mg by mouth every 8 (eight) hours as needed for pain.    Marland Kitchen amLODipine (NORVASC) 2.5 MG tablet Take 2 tablets (5 mg total) by mouth 2  (two) times daily. 60 tablet 10  . clopidogrel (PLAVIX) 75 MG tablet Take 1 tablet (75 mg total) by mouth daily. 90 tablet 3  . furosemide (LASIX) 20 MG tablet Take 20 mg by mouth daily as needed for fluid.    . hydrALAZINE (APRESOLINE) 50 MG tablet Take 1 tablet (50 mg total) by mouth 3 (three) times daily. 270 tablet 3  . insulin glargine (LANTUS) 100 UNIT/ML injection Inject 25-30 Units into the skin at bedtime. Based on sugar levels    . isosorbide mononitrate (IMDUR) 60 MG 24 hr tablet Take 120 mg by mouth at bedtime.    . metoprolol (LOPRESSOR) 50 MG tablet Take 50 mg by mouth 3 (three) times daily.    . nitroGLYCERIN (NITROSTAT) 0.4 MG SL tablet PLACE 1 TABLET UNDER TONGUE EVERY 5 MIN AS NEEDED FOR CHEST PAIN IF NO RELIEF IN15 MIN CALL 911 (MAX 3 TABS) 100 tablet 1  . Polyethyl Glycol-Propyl Glycol (SYSTANE OP) Place 1 drop into both eyes every morning.    . TRUETEST TEST test strip 1 each by Other route See admin instructions. Check blood sugar twice daily.     No current facility-administered medications for this visit.    Past Medical History  Diagnosis Date  . CAD (coronary artery disease)     S/P CABG January 2012; had poor target vessels for grafting.  A cath  9/13, LAD 100%, diagonal occluded, diffuse second marginal 90% stenosis, first obtuse  marginal occluded, nondominant right coronary occluded, saphenous vein graft to the diagonal occluded, saphenous vein graft to OM 40% stenosis, LIMA to the LAD patent .  Patent circumflex stents and unchanged anatomy 03/13/14  . Orthostatic hypotension     previous Midodrine therapy  . Obesity   . HLD (hyperlipidemia)     statin intolerant  . Aspirin allergy   . PVD (peripheral vascular disease)     prior balloon PCI to left leg in [place]  . Hypertension   . Anginal pain   . DVT (deep venous thrombosis) 1970's    LLE  . Pneumonia 2013?  Marland Kitchen Type II diabetes mellitus   . Arthritis     "hands, back" (12/09/2013)    Past Surgical  History  Procedure Laterality Date  . Coronary artery bypass graft  03/2010    CABG X3  . Carotid dopplers  03/2010    40-59% bilateral ICA stenosis  . Appendectomy    . Tonsillectomy    . Cholecystectomy  2012  . Cataract extraction w/ intraocular lens  implant, bilateral Bilateral   . Refractive surgery      laser  . Tubal ligation Bilateral   . Angioplasty / stenting femoral Left 03/14/2011  . Cardiac catheterization  03/2010; 2013  . Coronary angioplasty with stent placement  12/09/2013    circumflex was stented 2.75 x 16 overlapping 2.75 x 8 promus drug-eluting stents  . Vitrectomy Right 2015  . Left heart catheterization with coronary/graft angiogram N/A 11/21/2011    Procedure: LEFT HEART CATHETERIZATION WITH Isabel Caprice;  Surgeon: Kathleene Hazel, MD;  Location: [facility] CATH LAB;  Service: Cardiovascular;  Laterality: N/A;  . Right heart catheterization  11/21/2011    Procedure: RIGHT HEART CATH;  Surgeon: Kathleene Hazel, MD;  Location: [facility] CATH LAB;  Service: Cardiovascular;;  . Left heart catheterization with coronary/graft angiogram N/A 12/09/2013    Procedure: LEFT HEART CATHETERIZATION WITH Isabel Caprice;  Surgeon: Corky Crafts, MD;  Location: [facility] CATH LAB;  Service: Cardiovascular;  Laterality: N/A;  . Percutaneous coronary stent intervention (pci-s)  12/09/2013    Procedure: PERCUTANEOUS CORONARY STENT INTERVENTION (PCI-S);  Surgeon: Corky Crafts, MD;  Location: [facility] CATH LAB;  Service: Cardiovascular;;  . Left heart catheterization with coronary/graft angiogram N/A 03/13/2014    Procedure: LEFT HEART CATHETERIZATION WITH Isabel Caprice;  Surgeon: Peter M Swaziland, MD;  Location: [facility] CATH LAB;  Service: Cardiovascular;  Laterality: N/A;    ROS:  As stated in the HPI and negative for all other systems.  PHYSICAL EXAM BP 110/60 mmHg  Pulse 80  Ht 5\' 6"  (1.676 m)  Wt 209 lb 9.6 oz (95.074 kg)  BMI 33.85 kg/m2 GENERAL:  Well  appearing HEENT:  Pupils equal round and reactive, fundi not visualized, oral mucosa unremarkable NECK:  No jugular venous distention, waveform within normal limits, carotid upstroke brisk and symmetric, no bruits, no thyromegaly LUNGS:  Clear to auscultation bilaterally BACK:  No CVA tenderness CHEST:  Well healed sternotomy scar. HEART:  PMI not displaced or sustained,S1 and S2 within normal limits, no S3, no S4, no clicks, no rubs, no murmurs ABD:  Flat, positive bowel sounds normal in frequency in pitch, no bruits, no rebound, no guarding, no midline pulsatile mass, no hepatomegaly, no splenomegaly EXT:  2 plus pulses throughout, mild diffuse edema in hands and feet, no cyanosis no clubbing   ASSESSMENT AND PLAN  CAD - Her stents were patent on recent cath. She has diffuse native vessel  disease with continued angina. Therefore, we will continue with aggressive medical management. I will increase her Imdur 240bmg daily. She will have her other meds as below.  CAROTID ARTERY STENOSIS, BILATERAL -  She has 40 - 59% left and 60-79% left stenosis in Sept and she will have followup again in March.  HYPERLIPIDEMIA -  She has been intolerant of statins unfortunately and she will proceed with diet control.  HTN - I'll will instruct her to take her hydralazine 25 mg when necessary for any hypertensive urgencies and she will otherwise continue the meds as listed.

## 2014-05-02 NOTE — Patient Instructions (Signed)
Your physician recommends that you schedule a follow-up appointment in: one month with Dr. Antoine Poche  We have increased your Hydralazine to 50 mg four times a day  We have increased your Isosorbide to 240 mg daily

## 2014-05-05 ENCOUNTER — Ambulatory Visit: Payer: Commercial Managed Care - HMO | Admitting: Cardiology

## 2014-05-15 ENCOUNTER — Telehealth: Payer: Self-pay | Admitting: Cardiology

## 2014-05-15 NOTE — Telephone Encounter (Signed)
Returned call to patient. She states she cannot take the increased dose of hydralazine. She has made the decision to stop it. She reports she has felt bad since her hydralazine and imdur doses were increased on 2/19. She states she has swings in her BP.Marland Kitchen Sometimes it will go up to 210/110 and she was told to take an extra 25mg  of hydralazine when this occurs, but it plummets her BP. She states she "feels immune to the nitrate in the imdur". She states she has constant chest pain despite the increase in imdur and takes nitro SL daily. She took 4 doses of NTG yesterday. She states she is "really tuned in to her body". She states she had some "blurry vision" yesterday but this is better. She tolerated the previous dose of hydralazine & imdur but she still had swings in her BP. She states after her stents in the fall she had a few good weeks but has not been the same for the past 4 months. She states Dr. Antoine Poche is aware of her situation regarding the anginal pain and continued SL NTG use.   Patient is scheduled to see Dr. Antoine Poche 05/26/14. Patient was informed that this update will be sent to Dr. Antoine Poche and his nurse for review.

## 2014-05-15 NOTE — Telephone Encounter (Signed)
3/14 is the soonest office visit.

## 2014-05-15 NOTE — Telephone Encounter (Signed)
Pt says she can not take the Hydralazone. She says her symptoms are chest pains,diarrhea and eyes are very blurry. Her blood pressure right now is 99/57 and hate rate is 94.Please call asap to advise.

## 2014-05-21 NOTE — Telephone Encounter (Signed)
Double book this pt. On monday 3-14 for Dr. Antoine Poche to see her per Dr. Antoine Poche

## 2014-05-26 ENCOUNTER — Encounter (HOSPITAL_COMMUNITY): Payer: Self-pay | Admitting: Cardiology

## 2014-05-26 ENCOUNTER — Ambulatory Visit: Payer: Medicare HMO | Admitting: Cardiology

## 2014-06-05 ENCOUNTER — Telehealth: Payer: Self-pay | Admitting: Cardiology

## 2014-06-09 ENCOUNTER — Ambulatory Visit: Payer: Commercial Managed Care - HMO | Admitting: Cardiology

## 2014-06-16 NOTE — Telephone Encounter (Signed)
Close encounter 

## 2014-06-18 ENCOUNTER — Other Ambulatory Visit: Payer: Self-pay | Admitting: Cardiology

## 2014-06-30 ENCOUNTER — Ambulatory Visit (INDEPENDENT_AMBULATORY_CARE_PROVIDER_SITE_OTHER): Payer: Commercial Managed Care - HMO | Admitting: Cardiology

## 2014-06-30 ENCOUNTER — Encounter: Payer: Self-pay | Admitting: Cardiology

## 2014-06-30 VITALS — BP 142/82 | HR 80 | Ht 66.0 in | Wt 207.2 lb

## 2014-06-30 DIAGNOSIS — I6523 Occlusion and stenosis of bilateral carotid arteries: Secondary | ICD-10-CM

## 2014-06-30 NOTE — Progress Notes (Signed)
HPI The patient presents for followup of coronary disease.  She was recently hospitalized with unstable angina. Cardiac catheterization. This demonstrated a atent left main coronary artery  With a heavily diseased native left anterior descending artery and its branches. LIMA to LAD was patent. SVG to diagonal was occluded.  There was a 90% lesion in the mid left circumflex artery. Patent SVG to OM, but due to proximal disease in the OM, there was no flow into the distal circumflex system from the bypass graft. The native circumflex was stented 2.75 x 16 overlapping 2.75 x 8 promus drug-eluting stents, postdilated to greater than 3 mm. There was a chronically occluded right coronary artery.  I had her have a repeat catheterization in New Year's Eve. She had patent stents and anatomy is otherwise unchanged area medical management was suggested.  At the last visit I increased her Imdur to 180 mg daily.  To better control her blood pressure which might be contributing I increased the hydralazine to 50 mg 3 times daily.  Since I last saw her she has been feeling much better.  She hit a low point a few weeks ago and she stopped her hydralazine, stopped her amlodipine and halved her Imdur.  She now reports that she has been feeling much better.  She is rarely taking NTG.  She has had increased endurance.  She reports that her BP is better and it only goes up with diet.     Allergies  Allergen Reactions  . Bee Venom Anaphylaxis  . Statins Other (See Comments)    Crippling  . Aspirin Hives  . Atorvastatin Other (See Comments)    Arthralgias  . Codeine Hives  . Cortisone Hives  . Demerol Hives  . Meperidine Hives and Nausea And Vomiting  . Meperidine Hcl Hives  . Metformin Other (See Comments)    Intolerance  . Prednisone Hives    Current Outpatient Prescriptions  Medication Sig Dispense Refill  . acetaminophen (TYLENOL ARTHRITIS PAIN) 650 MG CR tablet Take 1,300 mg by mouth every 8 (eight)  hours as needed for pain.    Marland Kitchen clopidogrel (PLAVIX) 75 MG tablet Take 1 tablet (75 mg total) by mouth daily. 90 tablet 3  . furosemide (LASIX) 20 MG tablet Take 1 tablet (20 mg total) by mouth daily as needed for fluid. 30 tablet 9  . insulin glargine (LANTUS) 100 UNIT/ML injection Inject 25-30 Units into the skin at bedtime. Based on sugar levels    . isosorbide mononitrate (IMDUR) 120 MG 24 hr tablet Take 2 tablets (240 mg total) by mouth daily. (Patient taking differently: Take 120 mg by mouth daily. ) 180 tablet 3  . NITROSTAT 0.4 MG SL tablet PLACE 1 TABLET UNDER TONGUE EVERY 5 MIN AS NEEDED FOR CHEST PAIN IF NO RELIEF IN15 MIN CALL 911 (MAX 3 TABS) 100 tablet 0  . Polyethyl Glycol-Propyl Glycol (SYSTANE OP) Place 1 drop into both eyes every morning.     No current facility-administered medications for this visit.    Past Medical History  Diagnosis Date  . CAD (coronary artery disease)     S/P CABG January 2012; had poor target vessels for grafting.  A cath  9/13, LAD 100%, diagonal occluded, diffuse second marginal 90% stenosis, first obtuse marginal occluded, nondominant right coronary occluded, saphenous vein graft to the diagonal occluded, saphenous vein graft to OM 40% stenosis, LIMA to the LAD patent .  Patent circumflex stents and unchanged anatomy 03/13/14  .  Orthostatic hypotension     previous Midodrine therapy  . Obesity   . HLD (hyperlipidemia)     statin intolerant  . Aspirin allergy   . PVD (peripheral vascular disease)     prior balloon PCI to left leg in [place]  . Hypertension   . Anginal pain   . DVT (deep venous thrombosis) 1970's    LLE  . Pneumonia 2013?  Marland Kitchen Type II diabetes mellitus   . Arthritis     "hands, back" (12/09/2013)    Past Surgical History  Procedure Laterality Date  . Coronary artery bypass graft  03/2010    CABG X3  . Carotid dopplers  03/2010    40-59% bilateral ICA stenosis  . Appendectomy    . Tonsillectomy    . Cholecystectomy  2012   . Cataract extraction w/ intraocular lens  implant, bilateral Bilateral   . Refractive surgery      laser  . Tubal ligation Bilateral   . Angioplasty / stenting femoral Left 03/14/2011  . Cardiac catheterization  03/2010; 2013  . Coronary angioplasty with stent placement  12/09/2013    circumflex was stented 2.75 x 16 overlapping 2.75 x 8 promus drug-eluting stents  . Vitrectomy Right 2015  . Left heart catheterization with coronary/graft angiogram N/A 11/21/2011    Procedure: LEFT HEART CATHETERIZATION WITH Isabel Caprice;  Surgeon: Kathleene Hazel, MD;  Location: [facility] CATH LAB;  Service: Cardiovascular;  Laterality: N/A;  . Right heart catheterization  11/21/2011    Procedure: RIGHT HEART CATH;  Surgeon: Kathleene Hazel, MD;  Location: [facility] CATH LAB;  Service: Cardiovascular;;  . Left heart catheterization with coronary/graft angiogram N/A 12/09/2013    Procedure: LEFT HEART CATHETERIZATION WITH Isabel Caprice;  Surgeon: Corky Crafts, MD;  Location: [facility] CATH LAB;  Service: Cardiovascular;  Laterality: N/A;  . Percutaneous coronary stent intervention (pci-s)  12/09/2013    Procedure: PERCUTANEOUS CORONARY STENT INTERVENTION (PCI-S);  Surgeon: Corky Crafts, MD;  Location: [facility] CATH LAB;  Service: Cardiovascular;;  . Left heart catheterization with coronary/graft angiogram N/A 03/13/2014    Procedure: LEFT HEART CATHETERIZATION WITH Isabel Caprice;  Surgeon: Peter M Swaziland, MD;  Location: [facility] CATH LAB;  Service: Cardiovascular;  Laterality: N/A;    ROS:  As stated in the HPI and negative for all other systems.  PHYSICAL EXAM BP 142/82 mmHg  Pulse 80  Ht  (1.676 m)  Wt 207 lb 3.2 oz (93.985 kg)  BMI 33.46 kg/m2 GENERAL:  Well appearing NECK:  No jugular venous distention, waveform within normal limits, carotid upstroke brisk and symmetric, no bruits, no thyromegaly LUNGS:  Clear to auscultation bilaterally CHEST:  Well healed sternotomy  scar. HEART:  PMI not displaced or sustained,S1 and S2 within normal limits, no S3, no S4, no clicks, no rubs, no murmurs ABD:  Flat, positive bowel sounds normal in frequency in pitch, no bruits, no rebound, no guarding, no midline pulsatile mass, no hepatomegaly, no splenomegaly EXT:  2 plus pulses throughout, mild diffuse edema in hands and feet, no cyanosis no clubbing   ASSESSMENT AND PLAN  CAD - She is having fewer stents.  No change in therapy is planned.    CAROTID ARTERY STENOSIS, BILATERAL -  She has 40 - 59% left and 60-79% left stenosis in Sept.  She is overdue for follow up.   HYPERLIPIDEMIA -  She has been intolerant of statins (Pravachol, Lipitor, Crestor and Red Yeast Rice).  She was unable to take Zetia.  I will refer her to [facility] to consider PCSK9 inhibitor.  HTN - She says that her BP is now improved.  No change in therapy is indicated.

## 2014-06-30 NOTE — Patient Instructions (Addendum)
Your physician has requested that you have a carotid duplex. This test is an ultrasound of the carotid arteries in your neck. It looks at blood flow through these arteries that supply the brain with blood. Allow one hour for this exam. There are no restrictions or special instructions.  Your physician recommends that you schedule a follow-up appointment in: 6 Months.    We will make you an appointment with our Lipid Clinic.

## 2014-07-05 NOTE — Op Note (Signed)
PATIENT NAME:  [name], [name] MR#:  [id number] DATE OF BIRTH:  1945-08-24  DATE OF PROCEDURE:  05/29/2013  PROCEDURES PERFORMED:  1.  Pars plana vitrectomy of the right eye.  2.  Panretinal photocoagulation of the right eye.  3.  Air exchange of the right eye.   PREOPERATIVE DIAGNOSES:  1.  Dense vitreous hemorrhage of the right eye.  2.  Diabetic retinopathy of the right eye.   POSTOPERATIVE DIAGNOSES:  1.  Dense Vitreous hemorrhage of the right eye. 2.  Probable proliferative diabetic retinopathy.  PRIMARY SURGEON: Cline Cools, M.D.   ANESTHESIA: Retrobulbar block of the right eye with monitored anesthesia care.   COMPLICATIONS: None.   INDICATIONS FOR PROCEDURE: The patient presented to my office yesterday with sudden onset of loss of vision in the right eye. Examination revealed a dense vitreous hemorrhage. There was an area of concentrated bleeding temporally with a question of a retinal tear in a patient who does not have a history of proliferative diabetic retinopathy. Risks, benefits, and alternatives of the above procedure were discussed and the patient wished to proceed. The patient's vision was hand motion in the right eye.  DETAILS OF PROCEDURE: After informed consent was obtained, the patient was brought into the operative suite at [facility]. The patient was placed in supine position and was prepped and draped in sterile manner. The patient was given a small dose of Alfenta and a transconjunctival wound was created inferonasally. A subtenons tract was created and lidocaine was given and a retrobulbar fashion via the subtenon tract. A 25-gauge trocar was placed inferotemporally through displaced conjunctiva in an oblique fashion 3 mm beyond the limbus. The infusion cannula was turned on and inserted through the trocar and secured in position with Steri-Strips. Two more trocars were placed in a similar fashion superotemporally and superonasally.  Vitreous cutter and light pipe were introduced in the eye and a core vitrectomy was performed. Vitreous face was confirmed as elevated and the peripheral vitreous was trimmed for 360 degrees. The blood was vacuumed off of the surface of the retina. Scleral depressed exam was performed and no signs of any breaks or tears could be identified. A small area of, what looked like, proliferative diabetic retinopathy was noted temporally. Endolaser was introduced and 1534 spots of panretinal photocoagulation was performed for 360 degrees with sparing of the long ciliary nerves. A partial air-fluid exchange was performed and the trocars were removed. The wounds were noted to be airtight. Pressure in the eye was confirmed to be approximately 15 mmHg. Dexamethasone 5 mg was given into the inferior fornix and the lid speculum was removed. The eye was cleaned and TobraDex was placed in the eye. A patch and shield were placed over the eye and the patient was taken to postanesthesia care with instructions to remain head up.    ____________________________ Ignacia Felling. Champ Mungo, MD [id number] D: 05/29/2013 08:15:27 ET T: 05/29/2013 11:31:30 ET JOB#: [id number]  cc: Ignacia Felling. Champ Mungo, MD, <Dictator> Cline Cools MD ELECTRONICALLY SIGNED 06/19/2013 8:53

## 2014-07-06 NOTE — Op Note (Signed)
PATIENT NAME:  [name], [name] MR#:  [id number] DATE OF BIRTH:  [birth year]  DATE OF PROCEDURE:  03/14/2011  PREOPERATIVE DIAGNOSES:  1. Peripheral arterial disease with ulceration left lower extremity.  2. Diabetes mellitus.   POSTOPERATIVE DIAGNOSES:  1. Peripheral arterial disease with ulceration left lower extremity.  2. Diabetes mellitus.   PROCEDURE: 1. Catheter placement into left peroneal and anterior tibial artery from right femoral approach.  2. Aortogram and selective left lower extremity angiogram.  3. Percutaneous transluminal angioplasty of left peroneal artery with 2- and 3-mm diameter angioplasty balloons.  4. Percutaneous transluminal angioplasty of tibioperoneal trunk with 3-mm diameter angioplasty balloon.  5. Percutaneous transluminal angioplasty of anterior tibial artery with 3-mm diameter angioplasty balloon.   SURGEON: Annice Needy, M.D.   ANESTHESIA: Local with moderate conscious sedation.   ESTIMATED BLOOD LOSS:  Approximately 25 mL.   INDICATION FOR PROCEDURE: This is a 69 year old white female with diabetes and a nonhealing ulcer of her left heel. She saw me in the office last week and was found to have arterial disease mostly in the tibial vessels on noninvasive studies. For improvement of wound healing opportunities, risks and benefits of angiography with possible revascularization were discussed. Informed consent was obtained.   DESCRIPTION OF PROCEDURE: The patient was brought to the vascular interventional radiology suite. Groins were shaved and prepped and a sterile surgical field was created. The right femoral head was localized with fluoroscopy. The right femoral artery was accessed without difficulty with a Seldinger needle.  A 3J wire and a 5 French sheath were placed over the wire. A pigtail catheter was placed in the aorta at the L1 level and AP aortogram was performed. This showed normal renal vessels bilaterally with no flow-limiting stenosis in the  aortoiliac segments. I then hooked the aortic bifurcation and advanced to the left femoral head with the help of a Terumo advantage wire and selective left lower extremity angiogram was then performed. This demonstrated patent common femoral, profunda femoris, and superficial femoral arteries. The popliteal artery just above the knee had approximately 30 to 40% stenosis that was not flow-limiting. At the tibial trifurcation the anterior tibial artery was the best runoff to the foot but it had an approximately 80% stenosis in its proximal segment. The peroneal artery was occluded proximally but did have reconstitution distally. The posterior tibial artery did not appear to reconstitute distally. The patient was given 3500 units of intravenous heparin for systemic anticoagulation. I was able to get a 6 [place]sel sheath over the bifurcation over a Terumo advantage wire and advance into the peroneal artery and cross the peroneal occlusion without difficulty. I confirmed intraluminal flow in the distal peroneal artery and exchanged for an 0.018 wire. Percutaneous transluminal angioplasty in the more distal peroneal artery was done with a 2-mm diameter angioplasty balloon, and up in the proximal peroneal artery and the tibioperoneal trunk a 3-mm diameter angioplasty balloon was used subsequently. Following this there was markedly improved flow through the peroneal artery, and I turned my attention to revascularizing the anterior tibial artery. I was able to use an angled catheter to gain entry into the anterior tibial artery and cross the stenosis proximally without difficulty. The 3-mm diameter angioplasty balloon was inflated in the proximal anterior tibial artery with good angiographic completion result and significantly improved flow to the foot. At this point I elected to terminate the procedure. The sheath was removed. Pressure was held. Sterile dressing was placed. The patient tolerated the procedure well  and  was taken to the recovery room in stable condition.      ____________________________ Annice Needy, MD jsd:bjt D: 03/14/2011 11:27:26 ET T: 03/14/2011 12:01:39 ET JOB#: [id number]  cc: Annice Needy, MD, <Dictator> Linus Galas, DPM Annice Needy MD ELECTRONICALLY SIGNED 04/01/2011 7:56

## 2014-07-11 ENCOUNTER — Ambulatory Visit (INDEPENDENT_AMBULATORY_CARE_PROVIDER_SITE_OTHER): Payer: Commercial Managed Care - HMO | Admitting: Pharmacist Clinician (PhC)/ Clinical Pharmacy Specialist

## 2014-07-11 ENCOUNTER — Encounter: Payer: Self-pay | Admitting: Pharmacist Clinician (PhC)/ Clinical Pharmacy Specialist

## 2014-07-11 VITALS — Wt 206.0 lb

## 2014-07-11 DIAGNOSIS — E785 Hyperlipidemia, unspecified: Secondary | ICD-10-CM

## 2014-07-11 MED ORDER — EVOLOCUMAB 140 MG/ML ~~LOC~~ SOAJ: 140.0000 mg | SUBCUTANEOUS | Status: DC

## 2014-07-11 NOTE — Patient Instructions (Signed)
Start Repatha 140 mg every 2 weeks.  Restart fish oil, with 1 capsule twice daily for 4 days then see if can increase dose to 2 capsules twice daily.  Call if you can tolerate this.    Get information from your pharmacy about statin history and bring it by the office.    Repeat labs at the end of June.

## 2014-07-11 NOTE — Assessment & Plan Note (Addendum)
Pt with mixed hyperlipidemia, unable to tolerate multiple statin drugs and ezetimibe due to myopathies.  She was unable to tolerate fenofibrate, and she believes that fish oil caused her to have diarrhea.  I am going to give her some samples of Vascpa to see if the diarrhea was a true reaction and she is willing to give it a try.  As for the LDL, we are going to start Repatha 140 mg once every 14 days.  Patient was given first dose in office today.  Reviewed information about potential side effects and storage concerns.   Patient is on insulin, not concerned about needles.

## 2014-07-11 NOTE — Progress Notes (Signed)
07/11/2014 [name] [birth year] [id number]   HPI:  [name] is a 69 y.o. female patient of Dr. Antoine Poche, who presents today for lipid clinic evaluation.  Her past medical history includes CAD s/p CABG x 3 in 2012, IDDM, hyperlipidemia and carotid stenosis.  Her most recent catheterization was on March 13, 2014 and showed 3 vessel obstructive CAD.   She has an aspirin allergy.   In the past she has tried multiple statin drugs including pravastatin 40 and 80 mg (July to Dec 2012) and atorvastatin (April to June 2012).  She believes she has also tried simvastatin and rosuvastain, she will try to obtain dates from her local phamacy.  She tried red yeast rice, with similar results and she believes fish oil caused diarrhea.  At her last visit we tried her on Zetia 10 mg, which she took for about 7-8 weeks (October-December 2015) before the muscle pains made it intolerable.    Family history: Father had a MI at 83; brother had CABG x 4, died at 97.   Social history: Denies alcohol use, denies tobacco use.   Exercise: No regular exercise.  Has had symptomatic angina until recently.   Diet:  Pt previously taught nutrition classes, states diet includes very little red meat, no fried foods and everything in moderation.   RF: CAD, IDDM  Meds: none   Intolerant: simvastatin, atorvastatin, rosuvastatin, fish oil  Labs:  06/2014 -  TC 278, TG 204, HDL 45.6, LDL 192 11/2013 -  TC 285, TG 269, HDL 43.5, LDL 188    Current Outpatient Prescriptions  Medication Sig Dispense Refill  . acetaminophen (TYLENOL ARTHRITIS PAIN) 650 MG CR tablet Take 1,300 mg by mouth every 8 (eight) hours as needed for pain.    Marland Kitchen clopidogrel (PLAVIX) 75 MG tablet Take 1 tablet (75 mg total) by mouth daily. 90 tablet 3  . furosemide (LASIX) 20 MG tablet Take 1 tablet (20 mg total) by mouth daily as needed for fluid. 30 tablet 9  . insulin glargine (LANTUS) 100 UNIT/ML injection Inject 25-30 Units into the  skin at bedtime. Based on sugar levels    . isosorbide mononitrate (IMDUR) 120 MG 24 hr tablet Take 120 mg by mouth daily.    Marland Kitchen NITROSTAT 0.4 MG SL tablet PLACE 1 TABLET UNDER TONGUE EVERY 5 MIN AS NEEDED FOR CHEST PAIN IF NO RELIEF IN15 MIN CALL 911 (MAX 3 TABS) 100 tablet 0  . Polyethyl Glycol-Propyl Glycol (SYSTANE OP) Place 1 drop into both eyes every morning.     No current facility-administered medications for this visit.    Allergies  Allergen Reactions  . Bee Venom Anaphylaxis  . Statins Other (See Comments)    Crippling  . Aspirin Hives  . Atorvastatin Other (See Comments)    Arthralgias  . Codeine Hives  . Cortisone Hives  . Demerol Hives  . Meperidine Hives and Nausea And Vomiting  . Meperidine Hcl Hives  . Metformin Other (See Comments)    Intolerance  . Prednisone Hives    Past Medical History  Diagnosis Date  . CAD (coronary artery disease)     S/P CABG January 2012; had poor target vessels for grafting.  A cath  9/13, LAD 100%, diagonal occluded, diffuse second marginal 90% stenosis, first obtuse marginal occluded, nondominant right coronary occluded, saphenous vein graft to the diagonal occluded, saphenous vein graft to OM 40% stenosis, LIMA to the LAD patent .  Patent circumflex stents and unchanged  anatomy 03/13/14  . Orthostatic hypotension     previous Midodrine therapy  . Obesity   . HLD (hyperlipidemia)     statin intolerant  . Aspirin allergy   . PVD (peripheral vascular disease)     prior balloon PCI to left leg in [place]  . Hypertension   . Anginal pain   . DVT (deep venous thrombosis) 1970's    LLE  . Pneumonia 2013?  Marland Kitchen Type II diabetes mellitus   . Arthritis     "hands, back" (12/09/2013)    Weight 206 lb (93.441 kg).     Phillips Hay PharmD CPP [facility]

## 2014-07-15 ENCOUNTER — Ambulatory Visit (HOSPITAL_COMMUNITY)
Admission: RE | Admit: 2014-07-15 | Discharge: 2014-07-15 | Disposition: A | Discharge status: Home / Self Care | Payer: Commercial Managed Care - HMO | Source: Ambulatory Visit | Attending: Cardiology | Admitting: Cardiology

## 2014-07-15 DIAGNOSIS — I779 Disorder of arteries and arterioles, unspecified: Secondary | ICD-10-CM | POA: Diagnosis not present

## 2014-07-15 DIAGNOSIS — I6523 Occlusion and stenosis of bilateral carotid arteries: Secondary | ICD-10-CM | POA: Insufficient documentation

## 2014-07-15 NOTE — Progress Notes (Signed)
Carotid Duplex Completed. Stable right and left ICA stenosis. The right ICA 50-69% and the left ICA 1-49%. Marilynne Halsted, BS, RDMS, RVT

## 2014-10-10 ENCOUNTER — Other Ambulatory Visit: Payer: Self-pay | Admitting: Pharmacist Clinician (PhC)/ Clinical Pharmacy Specialist

## 2014-10-10 MED ORDER — EVOLOCUMAB 140 MG/ML ~~LOC~~ SOAJ: 140.0000 mg | SUBCUTANEOUS | Status: DC

## 2014-10-17 ENCOUNTER — Other Ambulatory Visit: Payer: Self-pay | Admitting: Pharmacist Clinician (PhC)/ Clinical Pharmacy Specialist

## 2014-10-17 MED ORDER — ALIROCUMAB 75 MG/ML ~~LOC~~ SOPN: 75.0000 mg | PEN_INJECTOR | SUBCUTANEOUS | Status: DC

## 2014-10-20 ENCOUNTER — Ambulatory Visit
Admission: RE | Admit: 2014-10-20 | Payer: Commercial Managed Care - HMO | Source: Ambulatory Visit | Admitting: Vascular Surgery

## 2014-10-20 ENCOUNTER — Encounter: Admission: RE | Payer: Self-pay | Source: Ambulatory Visit

## 2014-10-20 SURGERY — LOWER EXTREMITY ANGIOGRAPHY
Anesthesia: Moderate Sedation | Laterality: Left

## 2014-10-28 ENCOUNTER — Other Ambulatory Visit: Payer: Self-pay | Admitting: Cardiology

## 2014-10-30 LAB — LIPID PANEL W/DIRECT LDL/HDL RATIO
Cholesterol: 259 mg/dL — ABNORMAL HIGH (ref 125–200)
Direct LDL: 155 mg/dL — ABNORMAL HIGH (ref <130)
HDL: 48 mg/dL (ref >=46)
LDL:HDL Ratio: 3.2 Ratio
Total Chol/HDL Ratio: 5.4 Ratio — ABNORMAL HIGH (ref <=5.0)
Triglycerides: 240 mg/dL — ABNORMAL HIGH (ref <150)

## 2014-10-30 LAB — HEPATIC FUNCTION PANEL
ALT: 14 U/L (ref 6–29)
AST: 15 U/L (ref 10–35)
Albumin: 4.1 g/dL (ref 3.6–5.1)
Alkaline Phosphatase: 37 U/L (ref 33–130)
Bilirubin, Direct: <0.1 mg/dL (ref <=0.2)
Total Bilirubin: 0.4 mg/dL (ref 0.2–1.2)
Total Protein: 6.9 g/dL (ref 6.1–8.1)

## 2014-11-04 ENCOUNTER — Telehealth: Payer: Self-pay | Admitting: Cardiology

## 2014-11-04 NOTE — Telephone Encounter (Signed)
Forward to pharmacists [place]

## 2014-11-04 NOTE — Telephone Encounter (Signed)
Tresa Endo is calling in stating that they no longer carry Praluent and wanted to see if there is another medication this pt could take in its place. Please f/u with her  Thanks

## 2014-11-06 NOTE — Telephone Encounter (Signed)
Faxed application to [employer]

## 2015-01-07 ENCOUNTER — Encounter: Payer: Self-pay | Admitting: Cardiology

## 2015-01-07 ENCOUNTER — Ambulatory Visit (INDEPENDENT_AMBULATORY_CARE_PROVIDER_SITE_OTHER): Payer: Commercial Managed Care - HMO | Admitting: Cardiology

## 2015-01-07 VITALS — BP 138/72 | Ht 66.0 in | Wt 210.6 lb

## 2015-01-07 DIAGNOSIS — I25700 Atherosclerosis of coronary artery bypass graft(s), unspecified, with unstable angina pectoris: Secondary | ICD-10-CM | POA: Diagnosis not present

## 2015-01-07 NOTE — Progress Notes (Signed)
HPI The patient presents for followup of coronary disease.  She has an extensive history of CAD as previously described.  She has been intolerant of many medications.  She took herself off of most meds.  However, in the past several months she has been doing very well.   She is rarely taking NTG.  She has had increased endurance.  She reports that her BP is better and it only goes up with diet.  She is very upset because her 71 year old great grandson was hit by a car and is currently in critically ill.  Allergies  Allergen Reactions  . Bee Venom Anaphylaxis  . Statins Other (See Comments)    Crippling  . Aspirin Hives  . Atorvastatin Other (See Comments)    Arthralgias  . Codeine Hives  . Cortisone Hives  . Demerol Hives  . Meperidine Hives and Nausea And Vomiting  . Meperidine Hcl Hives  . Metformin Other (See Comments)    Intolerance  . Prednisone Hives    Current Outpatient Prescriptions  Medication Sig Dispense Refill  . acetaminophen (TYLENOL ARTHRITIS PAIN) 650 MG CR tablet Take 1,300 mg by mouth every 8 (eight) hours as needed for pain.    . furosemide (LASIX) 20 MG tablet Take 1 tablet (20 mg total) by mouth daily as needed for fluid. 30 tablet 9  . insulin glargine (LANTUS) 100 UNIT/ML injection Inject 25-30 Units into the skin at bedtime. Based on sugar levels    . NITROSTAT 0.4 MG SL tablet PLACE 1 TABLET UNDER TONGUE EVERY 5 MIN AS NEEDED FOR CHEST PAIN IF NO RELIEF IN15 MIN CALL 911 (MAX 3 TABS) 100 tablet 0  . Polyethyl Glycol-Propyl Glycol (SYSTANE OP) Place 1 drop into both eyes every morning.     No current facility-administered medications for this visit.    Past Medical History  Diagnosis Date  . CAD (coronary artery disease)     S/P CABG January 2012; had poor target vessels for grafting.  A cath  9/13, LAD 100%, diagonal occluded, diffuse second marginal 90% stenosis, first obtuse marginal occluded, nondominant right coronary occluded, saphenous vein  graft to the diagonal occluded, saphenous vein graft to OM 40% stenosis, LIMA to the LAD patent .  Patent circumflex stents and unchanged anatomy 03/13/14  . Orthostatic hypotension     previous Midodrine therapy  . Obesity   . HLD (hyperlipidemia)     statin intolerant  . Aspirin allergy   . PVD (peripheral vascular disease) (HCC)     prior balloon PCI to left leg in [place]  . Hypertension   . Anginal pain (HCC)   . DVT (deep venous thrombosis) (HCC) 1970's    LLE  . Pneumonia 2013?  Marland Kitchen Type II diabetes mellitus (HCC)   . Arthritis     "hands, back" (12/09/2013)    Past Surgical History  Procedure Laterality Date  . Coronary artery bypass graft  03/2010    CABG X3  . Carotid dopplers  03/2010    40-59% bilateral ICA stenosis  . Appendectomy    . Tonsillectomy    . Cholecystectomy  2012  . Cataract extraction w/ intraocular lens  implant, bilateral Bilateral   . Refractive surgery      laser  . Tubal ligation Bilateral   . Angioplasty / stenting femoral Left 03/14/2011  . Cardiac catheterization  03/2010; 2013  . Coronary angioplasty with stent placement  12/09/2013    circumflex was stented 2.75 x  16 overlapping 2.75 x 8 promus drug-eluting stents  . Vitrectomy Right 2015  . Left heart catheterization with coronary/graft angiogram N/A 11/21/2011    Procedure: LEFT HEART CATHETERIZATION WITH Isabel Caprice;  Surgeon: Kathleene Hazel, MD;  Location: [facility] CATH LAB;  Service: Cardiovascular;  Laterality: N/A;  . Right heart catheterization  11/21/2011    Procedure: RIGHT HEART CATH;  Surgeon: Kathleene Hazel, MD;  Location: [facility] CATH LAB;  Service: Cardiovascular;;  . Left heart catheterization with coronary/graft angiogram N/A 12/09/2013    Procedure: LEFT HEART CATHETERIZATION WITH Isabel Caprice;  Surgeon: Corky Crafts, MD;  Location: [facility] CATH LAB;  Service: Cardiovascular;  Laterality: N/A;  . Percutaneous coronary stent intervention (pci-s)   12/09/2013    Procedure: PERCUTANEOUS CORONARY STENT INTERVENTION (PCI-S);  Surgeon: Corky Crafts, MD;  Location: [facility] CATH LAB;  Service: Cardiovascular;;  . Left heart catheterization with coronary/graft angiogram N/A 03/13/2014    Procedure: LEFT HEART CATHETERIZATION WITH Isabel Caprice;  Surgeon: Peter M Swaziland, MD;  Location: [facility] CATH LAB;  Service: Cardiovascular;  Laterality: N/A;    ROS:  As stated in the HPI and negative for all other systems.  PHYSICAL EXAM BP 138/72 mmHg  Ht  (1.676 m)  Wt 210 lb 9.6 oz (95.528 kg)  BMI 34.01 kg/m2 GENERAL:  Well appearing NECK:  No jugular venous distention, waveform within normal limits, carotid upstroke brisk and symmetric, no bruits, no thyromegaly LUNGS:  Clear to auscultation bilaterally CHEST:  Well healed sternotomy scar. HEART:  PMI not displaced or sustained,S1 and S2 within normal limits, no S3, no S4, no clicks, no rubs, no murmurs ABD:  Flat, positive bowel sounds normal in frequency in pitch, no bruits, no rebound, no guarding, no midline pulsatile mass, no hepatomegaly, no splenomegaly EXT:  2 plus pulses throughout, mild diffuse edema in hands and feet, no cyanosis no clubbing   ASSESSMENT AND PLAN  CAD - She is having fewer stents.  No change in therapy is planned.    CAROTID ARTERY STENOSIS, BILATERAL -  She has 40 - 59% left and 60-79% left stenosis in Sept.  She will have follow up next May.   HYPERLIPIDEMIA -  She has been intolerant of statins (Pravachol, Lipitor, Crestor and Red Yeast Rice).  She was unable to take Zetia.  She could not afford PCSK9 inibitor.   HTN - Her BP is now improved.  No change in therapy is indicated.

## 2015-01-07 NOTE — Patient Instructions (Signed)
Your physician wants you to follow-up in: 6 Months You will receive a reminder letter in the mail two months in advance. If you don't receive a letter, please call our office to schedule the follow-up appointment.  

## 2015-07-13 NOTE — Progress Notes (Signed)
HPI The patient presents for followup of coronary disease.  She has an extensive history of CAD as previously described.  She has been intolerant of many medications.  She took herself off of most meds.  However, in the past several months she has been doing reasonably well.   She is taking NTG a few times per week.  This seems to be a stable pattern.  She had to switch her insulin secondary to cost.  Her blood sugars have been labile.  Her BPs she thinks as a consequence have been labile.  .  She has had increased endurance.    She says that she does have some wheezing that she has noted.   Allergies  Allergen Reactions  . Bee Venom Anaphylaxis  . Statins Other (See Comments)    Crippling  . Lisinopril Other (See Comments)    Fluctuations BP  . Aspirin Hives  . Atorvastatin Other (See Comments)    Arthralgias  . Codeine Hives and Nausea Only  . Cortisone Hives  . Demerol Hives  . Meperidine Hives and Nausea And Vomiting  . Meperidine Hcl Hives  . Metformin Other (See Comments)    Intolerance  . Prednisone Hives    Current Outpatient Prescriptions  Medication Sig Dispense Refill  . acetaminophen (TYLENOL ARTHRITIS PAIN) 650 MG CR tablet Take 1,300 mg by mouth every 8 (eight) hours as needed for pain.    . furosemide (LASIX) 20 MG tablet Take 1 tablet (20 mg total) by mouth daily as needed for fluid. 30 tablet 9  . insulin NPH-regular Human (NOVOLIN 70/30) (70-30) 100 UNIT/ML injection Inject 40 Units into the skin 2 (two) times daily with a meal.    . NITROSTAT 0.4 MG SL tablet PLACE 1 TABLET UNDER TONGUE EVERY 5 MIN AS NEEDED FOR CHEST PAIN IF NO RELIEF IN15 MIN CALL 911 (MAX 3 TABS) 100 tablet 0  . Polyethyl Glycol-Propyl Glycol (SYSTANE OP) Place 1 drop into both eyes every morning.     No current facility-administered medications for this visit.    Past Medical History  Diagnosis Date  . CAD (coronary artery disease)     S/P CABG January 2012; had poor target vessels  for grafting.  A cath  9/13, LAD 100%, diagonal occluded, diffuse second marginal 90% stenosis, first obtuse marginal occluded, nondominant right coronary occluded, saphenous vein graft to the diagonal occluded, saphenous vein graft to OM 40% stenosis, LIMA to the LAD patent .  Patent circumflex stents and unchanged anatomy 03/13/14  . Orthostatic hypotension     previous Midodrine therapy  . Obesity   . HLD (hyperlipidemia)     statin intolerant  . Aspirin allergy   . PVD (peripheral vascular disease) (HCC)     prior balloon PCI to left leg in [place]  . Hypertension   . Anginal pain (HCC)   . DVT (deep venous thrombosis) (HCC) 1970's    LLE  . Pneumonia 2013?  Marland Kitchen Type II diabetes mellitus (HCC)   . Arthritis     "hands, back" (12/09/2013)    Past Surgical History  Procedure Laterality Date  . Coronary artery bypass graft  03/2010    CABG X3  . Carotid dopplers  03/2010    40-59% bilateral ICA stenosis  . Appendectomy    . Tonsillectomy    . Cholecystectomy  2012  . Cataract extraction w/ intraocular lens  implant, bilateral Bilateral   . Refractive surgery      laser  .  Tubal ligation Bilateral   . Angioplasty / stenting femoral Left 03/14/2011  . Cardiac catheterization  03/2010; 2013  . Coronary angioplasty with stent placement  12/09/2013    circumflex was stented 2.75 x 16 overlapping 2.75 x 8 promus drug-eluting stents  . Vitrectomy Right 2015  . Left heart catheterization with coronary/graft angiogram N/A 11/21/2011    Procedure: LEFT HEART CATHETERIZATION WITH Isabel Caprice;  Surgeon: Kathleene Hazel, MD;  Location: [facility] CATH LAB;  Service: Cardiovascular;  Laterality: N/A;  . Right heart catheterization  11/21/2011    Procedure: RIGHT HEART CATH;  Surgeon: Kathleene Hazel, MD;  Location: [facility] CATH LAB;  Service: Cardiovascular;;  . Left heart catheterization with coronary/graft angiogram N/A 12/09/2013    Procedure: LEFT HEART CATHETERIZATION WITH  Isabel Caprice;  Surgeon: Corky Crafts, MD;  Location: [facility] CATH LAB;  Service: Cardiovascular;  Laterality: N/A;  . Percutaneous coronary stent intervention (pci-s)  12/09/2013    Procedure: PERCUTANEOUS CORONARY STENT INTERVENTION (PCI-S);  Surgeon: Corky Crafts, MD;  Location: [facility] CATH LAB;  Service: Cardiovascular;;  . Left heart catheterization with coronary/graft angiogram N/A 03/13/2014    Procedure: LEFT HEART CATHETERIZATION WITH Isabel Caprice;  Surgeon: Peter M Swaziland, MD;  Location: [facility] CATH LAB;  Service: Cardiovascular;  Laterality: N/A;    ROS:  As stated in the HPI and negative for all other systems.  PHYSICAL EXAM BP 120/74 mmHg  Pulse 82  Ht 5\' 6"  (1.676 m)  Wt 212 lb 4 oz (96.276 kg)  BMI 34.27 kg/m2 GENERAL:  Well appearing NECK:  No jugular venous distention, waveform within normal limits, carotid upstroke brisk and symmetric, no bruits, no thyromegaly LUNGS:  Clear to auscultation bilaterally CHEST:  Well healed sternotomy scar. HEART:  PMI not displaced or sustained,S1 and S2 within normal limits, no S3, no S4, no clicks, no rubs, no murmurs ABD:  Flat, positive bowel sounds normal in frequency in pitch, no bruits, no rebound, no guarding, no midline pulsatile mass, no hepatomegaly, no splenomegaly EXT:  2 plus pulses throughout, mild diffuse edema in hands and feet, no cyanosis no clubbing   ASSESSMENT AND PLAN  CAD - She is having stable angina.  No change in therapy is planned.    CAROTID ARTERY STENOSIS, BILATERAL -  She has 40 - 59% left and 60-79% left stenosis in Sept.  She will have follow up this month  HYPERLIPIDEMIA -  She has been intolerant of statins (Pravachol, Lipitor, Crestor and Red Yeast Rice).  She was unable to take Zetia.  She could not afford PCSK9 inibitor.   HTN - Her BP is now improved.  No change in therapy is indicated.

## 2015-07-14 ENCOUNTER — Encounter: Payer: Self-pay | Admitting: Cardiology

## 2015-07-14 ENCOUNTER — Ambulatory Visit (INDEPENDENT_AMBULATORY_CARE_PROVIDER_SITE_OTHER): Payer: Medicare HMO | Admitting: Cardiology

## 2015-07-14 VITALS — BP 120/74 | HR 82 | Ht 66.0 in | Wt 212.2 lb

## 2015-07-14 DIAGNOSIS — I25709 Atherosclerosis of coronary artery bypass graft(s), unspecified, with unspecified angina pectoris: Secondary | ICD-10-CM | POA: Diagnosis not present

## 2015-07-14 NOTE — Patient Instructions (Signed)
Medication Instructions:  Continue current medication theraphy  Labwork: NONE  Testing/Procedures: Your physician has requested that you have a carotid duplex. This test is an ultrasound of the carotid arteries in your neck. It looks at blood flow through these arteries that supply the brain with blood. Allow one hour for this exam. There are no restrictions or special instructions.  Follow-Up: 6 Months  Any Other Special Instructions Will Be Listed Below (If Applicable).   If you need a refill on your cardiac medications before your next appointment, please call your pharmacy.

## 2015-08-11 ENCOUNTER — Other Ambulatory Visit: Payer: Self-pay | Admitting: Cardiology

## 2015-08-11 DIAGNOSIS — I6523 Occlusion and stenosis of bilateral carotid arteries: Secondary | ICD-10-CM

## 2015-08-17 ENCOUNTER — Ambulatory Visit (HOSPITAL_COMMUNITY)
Admission: RE | Admit: 2015-08-17 | Discharge: 2015-08-17 | Disposition: A | Discharge status: Home / Self Care | Payer: Medicare HMO | Source: Ambulatory Visit | Attending: Cardiovascular Disease | Admitting: Cardiovascular Disease

## 2015-08-17 DIAGNOSIS — I10 Essential (primary) hypertension: Secondary | ICD-10-CM | POA: Insufficient documentation

## 2015-08-17 DIAGNOSIS — E119 Type 2 diabetes mellitus without complications: Secondary | ICD-10-CM | POA: Diagnosis not present

## 2015-08-17 DIAGNOSIS — I6523 Occlusion and stenosis of bilateral carotid arteries: Secondary | ICD-10-CM | POA: Insufficient documentation

## 2015-08-17 DIAGNOSIS — E785 Hyperlipidemia, unspecified: Secondary | ICD-10-CM | POA: Insufficient documentation

## 2015-08-17 DIAGNOSIS — I2510 Atherosclerotic heart disease of native coronary artery without angina pectoris: Secondary | ICD-10-CM | POA: Diagnosis not present

## 2015-08-17 DIAGNOSIS — R42 Dizziness and giddiness: Secondary | ICD-10-CM | POA: Diagnosis not present

## 2015-08-18 ENCOUNTER — Telehealth: Payer: Self-pay | Admitting: *Deleted

## 2015-08-18 DIAGNOSIS — I6523 Occlusion and stenosis of bilateral carotid arteries: Secondary | ICD-10-CM

## 2015-08-18 NOTE — Telephone Encounter (Signed)
-----   Message from Rollene Rotunda, MD sent at 08/18/2015  7:50 AM EDT ----- Follow up as suggested.  Call Ms. Mattila with the results.  A result will be sent to [name], MD Stable 40-59% RICA stenosis. Stable 1-39% LICA stenosis. Follow up one year.

## 2015-08-18 NOTE — Telephone Encounter (Signed)
Leave message with result on pt voicemail  Order place for 1 year  Result send to pt PCP via Epic

## 2015-11-04 ENCOUNTER — Emergency Department: Payer: Medicare HMO

## 2015-11-04 ENCOUNTER — Inpatient Hospital Stay
Admission: EM | Admit: 2015-11-04 | Discharge: 2015-11-06 | DRG: 287 | Disposition: A | Discharge status: Home / Self Care | Payer: Medicare HMO | Attending: Internal Medicine | Admitting: Internal Medicine

## 2015-11-04 DIAGNOSIS — Z66 Do not resuscitate: Secondary | ICD-10-CM | POA: Diagnosis present

## 2015-11-04 DIAGNOSIS — I25110 Atherosclerotic heart disease of native coronary artery with unstable angina pectoris: Secondary | ICD-10-CM | POA: Diagnosis not present

## 2015-11-04 DIAGNOSIS — Z955 Presence of coronary angioplasty implant and graft: Secondary | ICD-10-CM

## 2015-11-04 DIAGNOSIS — I200 Unstable angina: Secondary | ICD-10-CM

## 2015-11-04 DIAGNOSIS — Z79899 Other long term (current) drug therapy: Secondary | ICD-10-CM

## 2015-11-04 DIAGNOSIS — Z6832 Body mass index (BMI) 32.0-32.9, adult: Secondary | ICD-10-CM

## 2015-11-04 DIAGNOSIS — I208 Other forms of angina pectoris: Secondary | ICD-10-CM | POA: Diagnosis present

## 2015-11-04 DIAGNOSIS — I214 Non-ST elevation (NSTEMI) myocardial infarction: Secondary | ICD-10-CM | POA: Diagnosis present

## 2015-11-04 DIAGNOSIS — R0602 Shortness of breath: Secondary | ICD-10-CM

## 2015-11-04 DIAGNOSIS — Z8249 Family history of ischemic heart disease and other diseases of the circulatory system: Secondary | ICD-10-CM

## 2015-11-04 DIAGNOSIS — Z951 Presence of aortocoronary bypass graft: Secondary | ICD-10-CM

## 2015-11-04 DIAGNOSIS — Z800 Family history of malignant neoplasm of digestive organs: Secondary | ICD-10-CM

## 2015-11-04 DIAGNOSIS — I209 Angina pectoris, unspecified: Secondary | ICD-10-CM

## 2015-11-04 DIAGNOSIS — R072 Precordial pain: Secondary | ICD-10-CM

## 2015-11-04 DIAGNOSIS — I2510 Atherosclerotic heart disease of native coronary artery without angina pectoris: Secondary | ICD-10-CM | POA: Diagnosis present

## 2015-11-04 DIAGNOSIS — I2089 Other forms of angina pectoris: Secondary | ICD-10-CM | POA: Diagnosis present

## 2015-11-04 DIAGNOSIS — E1151 Type 2 diabetes mellitus with diabetic peripheral angiopathy without gangrene: Secondary | ICD-10-CM | POA: Diagnosis present

## 2015-11-04 DIAGNOSIS — I119 Hypertensive heart disease without heart failure: Secondary | ICD-10-CM | POA: Diagnosis present

## 2015-11-04 DIAGNOSIS — Z886 Allergy status to analgesic agent status: Secondary | ICD-10-CM

## 2015-11-04 DIAGNOSIS — I959 Hypotension, unspecified: Secondary | ICD-10-CM | POA: Diagnosis present

## 2015-11-04 DIAGNOSIS — Z823 Family history of stroke: Secondary | ICD-10-CM

## 2015-11-04 DIAGNOSIS — Z794 Long term (current) use of insulin: Secondary | ICD-10-CM

## 2015-11-04 DIAGNOSIS — E119 Type 2 diabetes mellitus without complications: Secondary | ICD-10-CM

## 2015-11-04 DIAGNOSIS — J209 Acute bronchitis, unspecified: Secondary | ICD-10-CM | POA: Diagnosis present

## 2015-11-04 DIAGNOSIS — E785 Hyperlipidemia, unspecified: Secondary | ICD-10-CM | POA: Diagnosis present

## 2015-11-04 HISTORY — DX: Hypertensive heart disease without heart failure: I11.9

## 2015-11-04 HISTORY — DX: Other forms of angina pectoris: I20.89

## 2015-11-04 HISTORY — DX: Peripheral vascular disease, unspecified: I73.9

## 2015-11-04 HISTORY — DX: Disorder of arteries and arterioles, unspecified: I77.9

## 2015-11-04 HISTORY — DX: Other ill-defined heart diseases: I51.89

## 2015-11-04 HISTORY — DX: Other forms of angina pectoris: I20.8

## 2015-11-04 LAB — CBC WITH DIFFERENTIAL/PLATELET
Basophils Absolute: 0.1 10*3/uL (ref 0–0.1)
Basophils Relative: 1 %
Eosinophils Absolute: 0.2 10*3/uL (ref 0–0.7)
Eosinophils Relative: 3 %
HCT: 40 % (ref 35.0–47.0)
Hemoglobin: 13.8 g/dL (ref 12.0–16.0)
Lymphocytes Relative: 35 %
Lymphs Abs: 2.5 10*3/uL (ref 1.0–3.6)
MCH: 29.4 pg (ref 26.0–34.0)
MCHC: 34.6 g/dL (ref 32.0–36.0)
MCV: 84.9 fL (ref 80.0–100.0)
Monocytes Absolute: 0.4 10*3/uL (ref 0.2–0.9)
Monocytes Relative: 6 %
Neutro Abs: 3.9 10*3/uL (ref 1.4–6.5)
Neutrophils Relative %: 55 %
Platelets: 224 10*3/uL (ref 150–440)
RBC: 4.7 MIL/uL (ref 3.80–5.20)
RDW: 13.5 % (ref 11.5–14.5)
WBC: 7 10*3/uL (ref 3.6–11.0)

## 2015-11-04 LAB — COMPREHENSIVE METABOLIC PANEL
ALT: 17 U/L (ref 14–54)
AST: 21 U/L (ref 15–41)
Albumin: 3.8 g/dL (ref 3.5–5.0)
Alkaline Phosphatase: 57 U/L (ref 38–126)
Anion gap: 11 (ref 5–15)
BUN: 18 mg/dL (ref 6–20)
CO2: 22 mmol/L (ref 22–32)
Calcium: 9.1 mg/dL (ref 8.9–10.3)
Chloride: 107 mmol/L (ref 101–111)
Creatinine, Ser: 0.7 mg/dL (ref 0.44–1.00)
GFR calc Af Amer: >60 mL/min (ref >60)
GFR calc non Af Amer: >60 mL/min (ref >60)
Glucose, Bld: 177 mg/dL — ABNORMAL HIGH (ref 65–99)
Potassium: 4.1 mmol/L (ref 3.5–5.1)
Sodium: 140 mmol/L (ref 135–145)
Total Bilirubin: 0.2 mg/dL — ABNORMAL LOW (ref 0.3–1.2)
Total Protein: 7.5 g/dL (ref 6.5–8.1)

## 2015-11-04 LAB — TROPONIN I: Troponin I: <0.03 ng/mL (ref <0.03)

## 2015-11-04 MED ORDER — FENTANYL CITRATE (PF) 100 MCG/2ML IJ SOLN
50.0000 ug | INTRAMUSCULAR | Status: DC | PRN
  Administered 2015-11-04: 100 ug via INTRAVENOUS

## 2015-11-04 MED ORDER — NITROGLYCERIN 0.4 MG SL SUBL
0.4000 mg | SUBLINGUAL_TABLET | SUBLINGUAL | Status: DC | PRN
  Administered 2015-11-04: 0.4 mg via SUBLINGUAL

## 2015-11-04 MED ORDER — NITROGLYCERIN IN D5W 200-5 MCG/ML-% IV SOLN: 0.0000 ug/min | Freq: Once | INTRAVENOUS | Status: DC

## 2015-11-04 MED ORDER — IPRATROPIUM-ALBUTEROL 0.5-2.5 (3) MG/3ML IN SOLN
3.0000 mL | Freq: Once | RESPIRATORY_TRACT | Status: AC
  Administered 2015-11-04: 3 mL via RESPIRATORY_TRACT
  Filled 2015-11-04: qty 3

## 2015-11-04 MED ORDER — NITROGLYCERIN 0.4 MG SL SUBL
SUBLINGUAL_TABLET | SUBLINGUAL | Status: AC
  Administered 2015-11-04: 0.4 mg via SUBLINGUAL
  Filled 2015-11-04: qty 1

## 2015-11-04 MED ORDER — FENTANYL CITRATE (PF) 100 MCG/2ML IJ SOLN
INTRAMUSCULAR | Status: AC
  Administered 2015-11-04: 100 ug via INTRAVENOUS
  Filled 2015-11-04: qty 2

## 2015-11-04 NOTE — ED Provider Notes (Addendum)
[facility] Emergency Department Provider Note    First MD Initiated Contact with Patient 11/04/15 2042     (approximate)  I have reviewed the triage vital signs and the nursing notes.   HISTORY  Chief Complaint Chest Pain    HPI [name] is a 70 y.o. female with history of severe CAD with multiple vessel disease status post CABG currently off anticoagulation or antiplatelet due to medication intolerance.Patient seen by cardiologist back in May and felt to be stable at that time. The patient presents to the ER today due to crushing chest pain and pressure without radiation to her back or shoulders. Patient states that the pain is located in her left breast and feels like she is having a heart attack. States the pain started while she was talking on the phone with her daughter. She is not active at that time. States that she didn't have associated nausea and diaphoresis. States that she took to several nitroglycerin at home and had some improvement of pain reportedly came down from a 13 out of 10 to an 8 out of 10. EMS arrived and started her on Nitropaste and brought her to the ER.   Past Medical History:  Diagnosis Date  . Anginal pain (HCC)   . Arthritis    "hands, back" (12/09/2013)  . Aspirin allergy   . CAD (coronary artery disease)    S/P CABG January 2012; had poor target vessels for grafting.  A cath  9/13, LAD 100%, diagonal occluded, diffuse second marginal 90% stenosis, first obtuse marginal occluded, nondominant right coronary occluded, saphenous vein graft to the diagonal occluded, saphenous vein graft to OM 40% stenosis, LIMA to the LAD patent .  Patent circumflex stents and unchanged anatomy 03/13/14  . DVT (deep venous thrombosis) (HCC) 1970's   LLE  . HLD (hyperlipidemia)    statin intolerant  . Hypertension   . Obesity   . Orthostatic hypotension    previous Midodrine therapy  . Pneumonia 2013?  Marland Kitchen PVD (peripheral vascular  disease) (HCC)    prior balloon PCI to left leg in [place]  . Type II diabetes mellitus [facility])     Patient Active Problem List   Diagnosis Date Noted  . Unstable angina (HCC) 03/13/2014  . Other and unspecified angina pectoris 12/09/2013  . Equivalent angina (HCC) 11/19/2011  . GOUT, UNSPECIFIED 05/18/2010  . HYPERLIPIDEMIA 04/19/2010  . Coronary atherosclerosis 04/19/2010  . CAROTID ARTERY STENOSIS, BILATERAL 04/19/2010  . ORTHOSTATIC HYPOTENSION 04/19/2010  . 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 HYPOTHYROIDISM 03/26/2010  . DM 03/26/2010    Past Surgical History:  Procedure Laterality Date  . ANGIOPLASTY / STENTING FEMORAL Left 03/14/2011  . APPENDECTOMY    . CARDIAC CATHETERIZATION  03/2010; 2013  . Carotid dopplers  03/2010   40-59% bilateral ICA stenosis  . CATARACT EXTRACTION W/ INTRAOCULAR LENS  IMPLANT, BILATERAL Bilateral   . CHOLECYSTECTOMY  2012  . CORONARY ANGIOPLASTY WITH STENT PLACEMENT  12/09/2013   circumflex was stented 2.75 x 16 overlapping 2.75 x 8 promus drug-eluting stents  . CORONARY ARTERY BYPASS GRAFT  03/2010   CABG X3  . LEFT HEART CATHETERIZATION WITH CORONARY/GRAFT ANGIOGRAM N/A 11/21/2011   Procedure: LEFT HEART CATHETERIZATION WITH Isabel Caprice;  Surgeon: Kathleene Hazel, MD;  Location: [facility] CATH LAB;  Service: Cardiovascular;  Laterality: N/A;  . LEFT HEART CATHETERIZATION WITH CORONARY/GRAFT ANGIOGRAM N/A 12/09/2013   Procedure: LEFT HEART CATHETERIZATION WITH CORONARY/GRAFT ANGIOGRAM;  Surgeon: Corky Crafts,  MD;  Location: [facility] CATH LAB;  Service: Cardiovascular;  Laterality: N/A;  . LEFT HEART CATHETERIZATION WITH CORONARY/GRAFT ANGIOGRAM N/A 03/13/2014   Procedure: LEFT HEART CATHETERIZATION WITH Isabel Caprice;  Surgeon: Peter M Swaziland, MD;  Location: [facility] CATH LAB;  Service: Cardiovascular;  Laterality: N/A;  . PERCUTANEOUS CORONARY STENT INTERVENTION (PCI-S)  12/09/2013   Procedure: PERCUTANEOUS  CORONARY STENT INTERVENTION (PCI-S);  Surgeon: Corky Crafts, MD;  Location: [facility] CATH LAB;  Service: Cardiovascular;;  . REFRACTIVE SURGERY     laser  . RIGHT HEART CATHETERIZATION  11/21/2011   Procedure: RIGHT HEART CATH;  Surgeon: Kathleene Hazel, MD;  Location: [facility] CATH LAB;  Service: Cardiovascular;;  . TONSILLECTOMY    . TUBAL LIGATION Bilateral   . VITRECTOMY Right 2015    Prior to Admission medications   Medication Sig Start Date End Date Taking? Authorizing Provider  acetaminophen (TYLENOL ARTHRITIS PAIN) 650 MG CR tablet Take 1,300 mg by mouth every 8 (eight) hours as needed for pain.    Historical Provider, MD  furosemide (LASIX) 20 MG tablet Take 1 tablet (20 mg total) by mouth daily as needed for fluid. 05/02/14   Rollene Rotunda, MD  insulin NPH-regular Human (NOVOLIN 70/30) (70-30) 100 UNIT/ML injection Inject 40 Units into the skin 2 (two) times daily with a meal.    Historical Provider, MD  NITROSTAT 0.4 MG SL tablet PLACE 1 TABLET UNDER TONGUE EVERY 5 MIN AS NEEDED FOR CHEST PAIN IF NO RELIEF IN15 MIN CALL 911 (MAX 3 TABS) 06/18/14   Rollene Rotunda, MD  Polyethyl Glycol-Propyl Glycol (SYSTANE OP) Place 1 drop into both eyes every morning.    Historical Provider, MD    Allergies Bee venom; Statins; Lisinopril; Aspirin; Atorvastatin; Codeine; Cortisone; Demerol; Meperidine; Meperidine hcl; Metformin; and Prednisone  Family History  Problem Relation Age of Onset  . Esophageal cancer Mother   . Stroke Mother   . Heart attack Father     MI  . Heart attack Brother     CABG x 4   . Heart disease Brother     valve replacement    Social History Social History  Substance Use Topics  . Smoking status: Never Smoker  . Smokeless tobacco: Never Used  . Alcohol use No    Review of Systems Patient denies headaches, rhinorrhea, blurry vision, numbness, shortness of breath, chest pain, edema, cough, abdominal pain, nausea, vomiting, diarrhea, dysuria, fevers, rashes or  hallucinations unless otherwise stated above in HPI. ____________________________________________   PHYSICAL EXAM:  VITAL SIGNS: Vitals:   11/04/15 2200 11/04/15 2230  BP: (!) 107/59 (!) 109/57  Pulse: 84 86  Resp: (!) 23 (!) 22  Temp:      Constitutional: Alert and oriented. Uncomfortable appearing  Eyes: Conjunctivae are normal. PERRL. EOMI. Head: Atraumatic. Nose: No congestion/rhinnorhea. Mouth/Throat: Mucous membranes are moist.  Oropharynx non-erythematous. Neck: No stridor. Painless ROM. No cervical spine tenderness to palpation Hematological/Lymphatic/Immunilogical: No cervical lymphadenopathy. Cardiovascular: Normal rate, regular rhythm. Grossly normal heart sounds.  Good peripheral circulation. Respiratory: Normal respiratory effort.  No retractions. Lungs CTAB. Gastrointestinal: Soft and nontender. No distention. No abdominal bruits. No CVA tenderness. Genitourinary:  Musculoskeletal: No lower extremity tenderness nor edema.  No joint effusions. Neurologic:  Normal speech and language. No gross focal neurologic deficits are appreciated. No gait instability. Skin:  Skin is warm, dry and intact. No rash noted. Psychiatric: Mood and affect are normal. Speech and behavior are normal.  ____________________________________________   LABS (all labs ordered are listed, but  only abnormal results are displayed)  Results for orders placed or performed during the hospital encounter of 11/04/15 (from the past 24 hour(s))  CBC with Differential/Platelet     Status: None   Collection Time: 11/04/15  8:54 PM  Result Value Ref Range   WBC 7.0 3.6 - 11.0 K/uL   RBC 4.70 3.80 - 5.20 MIL/uL   Hemoglobin 13.8 12.0 - 16.0 g/dL   HCT 78.240.0 95.635.0 - 21.347.0 %   MCV 84.9 80.0 - 100.0 fL   MCH 29.4 26.0 - 34.0 pg   MCHC 34.6 32.0 - 36.0 g/dL   RDW 08.613.5 57.811.5 - 46.914.5 %   Platelets 224 150 - 440 K/uL   Neutrophils Relative % 55 %   Neutro Abs 3.9 1.4 - 6.5 K/uL   Lymphocytes Relative 35 %     Lymphs Abs 2.5 1.0 - 3.6 K/uL   Monocytes Relative 6 %   Monocytes Absolute 0.4 0.2 - 0.9 K/uL   Eosinophils Relative 3 %   Eosinophils Absolute 0.2 0 - 0.7 K/uL   Basophils Relative 1 %   Basophils Absolute 0.1 0 - 0.1 K/uL  Comprehensive metabolic panel     Status: Abnormal   Collection Time: 11/04/15  8:54 PM  Result Value Ref Range   Sodium 140 135 - 145 mmol/L   Potassium 4.1 3.5 - 5.1 mmol/L   Chloride 107 101 - 111 mmol/L   CO2 22 22 - 32 mmol/L   Glucose, Bld 177 (H) 65 - 99 mg/dL   BUN 18 6 - 20 mg/dL   Creatinine, Ser 6.290.70 0.44 - 1.00 mg/dL   Calcium 9.1 8.9 - 52.810.3 mg/dL   Total Protein 7.5 6.5 - 8.1 g/dL   Albumin 3.8 3.5 - 5.0 g/dL   AST 21 15 - 41 U/L   ALT 17 14 - 54 U/L   Alkaline Phosphatase 57 38 - 126 U/L   Total Bilirubin 0.2 (L) 0.3 - 1.2 mg/dL   GFR calc non Af Amer >60 >60 mL/min   GFR calc Af Amer >60 >60 mL/min   Anion gap 11 5 - 15  Troponin I     Status: None   Collection Time: 11/04/15  8:54 PM  Result Value Ref Range   Troponin I <0.03 <0.03 ng/mL   ____________________________________________  EKG My review and personal interpretation at Time: 20:49   Indication: chest pain  Rate: 95  Rhythm: sinus Axis: normal Other: ST depressions in infero-lateral leads  ____________________________________________  RADIOLOGY CXR my read shows no evidence of acute cardiopulmonary process.  ____________________________________________   PROCEDURES  Procedure(s) performed: none    Critical Care performed: no ____________________________________________   INITIAL IMPRESSION / ASSESSMENT AND PLAN / ED COURSE  Pertinent labs & imaging results that were available during my care of the patient were reviewed by me and considered in my medical decision making (see chart for details).  DDX: acs, ua, pericarditis, pna, bronchitis, dissection  Annabell HowellsMargaret R Charlesworth is a 70 y.o. who presents to the ED with Severe midsternal chest pain and pressure.  Patient feels is just like heart attack. Patient is afebrile and hypertensive. Initial EKG does show evidence of ischemic changes. She has strong pulses in bilateral upper extremities with based on her hypertension and severe pain dissection is also considered. Based on presentation and sudden onset of pain while at rest given her severe coronary artery disease or do feel that this is most concerning for unstable angina. Patient not medically optimize due  to multiple medication sensitivities, including severe allergy to ASA, place her at increased risk. Patient also with multiple catheters previously showing severe multiple vessel disease not amenable to PCI. Patient has no evidence of acute infectious process.  Clinical Course  Comment By Time  Patient reassessed and states pain is currently 2 out of 10 after receiving sublingual nitroglycerin and fentanyl. Initial troponin is negative. Based on her severe pain and history will order CT chest to evaluate for dissection Willy Eddy, MD 08/23 2144  Patient reassessed and currently states she is chest pain-free. Patient requesting discharge home. Encouraged patient to stay as I'm concerned for unstable angina.   Patient refused CT chest despite conversation regarding my concern for possible dissection.  Given her history will repeat EKG. We'll continue to keep a monitor. Will contact her primary cardiologist. Willy Eddy, MD 08/23 2222  Pressure remained well controlled and patient is currently chest pain-free. Repeatedly asking about discharge home. I relayed my concern regarding her symptoms concerning for unstable angina given her chest pain at rest in my recommendation for admission to hospital for further evaluation and management. Patient demonstrates understanding of the risks associated with leaving the ER given her significant cardiac history.  She is agreed to have her troponin and EKG repeated. Willy Eddy, MD 08/23 2341  Currently  awaiting repeat troponin.  Patient is hemodynamically stable at this time.  The patient will be signed out to oncoming physician Dr. Dolores Frame. Plan is for discussion with patient showed her troponin be positive to encourage her for admission for further evaluation and cardiology evaluation the morning. Based on her history and description of pain and do feel that she still needs admission.  Based on her significant coronary disease and severe chest pain earlier, should the patient decided to leave, I did discuss with the patient and it would be AGAINST MEDICAL ADVICE. Have discussed with the patient and available family all diagnostics and treatments performed thus far and all questions were answered to the best of my ability. The patient demonstrates understanding and agreement with plan.  Willy Eddy, MD 08/24 0023     ____________________________________________   FINAL CLINICAL IMPRESSION(S) / ED DIAGNOSES  Final diagnoses:  Precordial pain  Unstable angina (HCC)      NEW MEDICATIONS STARTED DURING THIS VISIT:  New Prescriptions   No medications on file     Note:  This document was prepared using Dragon voice recognition software and may include unintentional dictation errors.    Willy Eddy, MD 11/05/15 4540    Willy Eddy, MD 11/05/15 [place]

## 2015-11-04 NOTE — ED Notes (Signed)
Pt arrived to er via ems for c/o chest pain/pressure with 8 out of 10 pain that started around 650pm - hx of bypass surgery and states that during last MI she never showed change on EKG - pt has had productive cough for the last week - pt took 2 ntg prior to ems and ems gave 1 spray of ntg and placed 1" of ntg paste on left chest 

## 2015-11-04 NOTE — ED Notes (Signed)
BP decreased to 104/59 - pt denies dizziness or lightheadedness - denies nausea - states chest pressure 2 out of 10 - MD Roxan Hockey notified of decreased BP and verbal order given to hold nitro drip until pain/pressure returns OR BP elevates

## 2015-11-04 NOTE — ED Triage Notes (Signed)
Pt arrived to er via ems for c/o chest pain/pressure with 8 out of 10 pain that started around 650pm - hx of bypass surgery and states that during last MI she never showed change on EKG - pt has had productive cough for the last week - pt took 2 ntg prior to ems and ems gave 1 spray of ntg and placed 1" of ntg paste on left chest

## 2015-11-05 ENCOUNTER — Inpatient Hospital Stay: Payer: Medicare HMO

## 2015-11-05 ENCOUNTER — Encounter: Payer: Self-pay | Admitting: Nurse Practitioner

## 2015-11-05 ENCOUNTER — Encounter
Admission: EM | Disposition: A | Discharge status: Home / Self Care | Payer: Self-pay | Source: Home / Self Care | Attending: Internal Medicine

## 2015-11-05 DIAGNOSIS — E1151 Type 2 diabetes mellitus with diabetic peripheral angiopathy without gangrene: Secondary | ICD-10-CM | POA: Diagnosis present

## 2015-11-05 DIAGNOSIS — Z951 Presence of aortocoronary bypass graft: Secondary | ICD-10-CM | POA: Diagnosis not present

## 2015-11-05 DIAGNOSIS — R0602 Shortness of breath: Secondary | ICD-10-CM

## 2015-11-05 DIAGNOSIS — Z8249 Family history of ischemic heart disease and other diseases of the circulatory system: Secondary | ICD-10-CM | POA: Diagnosis not present

## 2015-11-05 DIAGNOSIS — Z886 Allergy status to analgesic agent status: Secondary | ICD-10-CM | POA: Diagnosis not present

## 2015-11-05 DIAGNOSIS — Z6832 Body mass index (BMI) 32.0-32.9, adult: Secondary | ICD-10-CM | POA: Diagnosis not present

## 2015-11-05 DIAGNOSIS — E785 Hyperlipidemia, unspecified: Secondary | ICD-10-CM | POA: Diagnosis present

## 2015-11-05 DIAGNOSIS — Z66 Do not resuscitate: Secondary | ICD-10-CM | POA: Diagnosis present

## 2015-11-05 DIAGNOSIS — E1159 Type 2 diabetes mellitus with other circulatory complications: Secondary | ICD-10-CM

## 2015-11-05 DIAGNOSIS — I25111 Atherosclerotic heart disease of native coronary artery with angina pectoris with documented spasm: Secondary | ICD-10-CM

## 2015-11-05 DIAGNOSIS — Z955 Presence of coronary angioplasty implant and graft: Secondary | ICD-10-CM | POA: Diagnosis not present

## 2015-11-05 DIAGNOSIS — I959 Hypotension, unspecified: Secondary | ICD-10-CM | POA: Diagnosis present

## 2015-11-05 DIAGNOSIS — I214 Non-ST elevation (NSTEMI) myocardial infarction: Secondary | ICD-10-CM | POA: Diagnosis not present

## 2015-11-05 DIAGNOSIS — I25110 Atherosclerotic heart disease of native coronary artery with unstable angina pectoris: Secondary | ICD-10-CM | POA: Diagnosis present

## 2015-11-05 DIAGNOSIS — J209 Acute bronchitis, unspecified: Secondary | ICD-10-CM | POA: Diagnosis present

## 2015-11-05 DIAGNOSIS — I119 Hypertensive heart disease without heart failure: Secondary | ICD-10-CM | POA: Diagnosis present

## 2015-11-05 DIAGNOSIS — I2510 Atherosclerotic heart disease of native coronary artery without angina pectoris: Secondary | ICD-10-CM | POA: Diagnosis present

## 2015-11-05 DIAGNOSIS — E119 Type 2 diabetes mellitus without complications: Secondary | ICD-10-CM

## 2015-11-05 DIAGNOSIS — Z800 Family history of malignant neoplasm of digestive organs: Secondary | ICD-10-CM | POA: Diagnosis not present

## 2015-11-05 DIAGNOSIS — I208 Other forms of angina pectoris: Secondary | ICD-10-CM | POA: Diagnosis present

## 2015-11-05 DIAGNOSIS — Z794 Long term (current) use of insulin: Secondary | ICD-10-CM

## 2015-11-05 DIAGNOSIS — R072 Precordial pain: Secondary | ICD-10-CM | POA: Diagnosis present

## 2015-11-05 DIAGNOSIS — Z79899 Other long term (current) drug therapy: Secondary | ICD-10-CM | POA: Diagnosis not present

## 2015-11-05 DIAGNOSIS — Z823 Family history of stroke: Secondary | ICD-10-CM | POA: Diagnosis not present

## 2015-11-05 HISTORY — PX: CARDIAC CATHETERIZATION: SHX172

## 2015-11-05 LAB — CBC
HCT: 36.1 % (ref 35.0–47.0)
Hemoglobin: 12.4 g/dL (ref 12.0–16.0)
MCH: 29.5 pg (ref 26.0–34.0)
MCHC: 34.4 g/dL (ref 32.0–36.0)
MCV: 85.7 fL (ref 80.0–100.0)
Platelets: 192 10*3/uL (ref 150–440)
RBC: 4.21 MIL/uL (ref 3.80–5.20)
RDW: 13.4 % (ref 11.5–14.5)
WBC: 6.6 10*3/uL (ref 3.6–11.0)

## 2015-11-05 LAB — APTT: aPTT: 38 seconds — ABNORMAL HIGH (ref 24–36)

## 2015-11-05 LAB — GLUCOSE, CAPILLARY
Glucose-Capillary: 151 mg/dL — ABNORMAL HIGH (ref 65–99)
Glucose-Capillary: 163 mg/dL — ABNORMAL HIGH (ref 65–99)
Glucose-Capillary: 147 mg/dL — ABNORMAL HIGH (ref 65–99)
Glucose-Capillary: 165 mg/dL — ABNORMAL HIGH (ref 65–99)
Glucose-Capillary: 160 mg/dL — ABNORMAL HIGH (ref 65–99)

## 2015-11-05 LAB — PROTIME-INR
INR: 1.07
Prothrombin Time: 13.9 seconds (ref 11.4–15.2)

## 2015-11-05 LAB — TROPONIN I
Troponin I: 0.14 ng/mL (ref <0.03)
Troponin I: 0.18 ng/mL (ref <0.03)
Troponin I: 0.2 ng/mL (ref <0.03)
Troponin I: 0.24 ng/mL (ref <0.03)

## 2015-11-05 LAB — TSH: TSH: 2.081 u[IU]/mL (ref 0.350–4.500)

## 2015-11-05 LAB — HEMOGLOBIN A1C: Hgb A1c MFr Bld: 7.7 % — ABNORMAL HIGH (ref 4.0–6.0)

## 2015-11-05 SURGERY — LEFT HEART CATH AND CORS/GRAFTS ANGIOGRAPHY
Anesthesia: Moderate Sedation

## 2015-11-05 MED ORDER — SODIUM CHLORIDE 0.9% FLUSH
3.0000 mL | Freq: Two times a day (BID) | INTRAVENOUS | Status: DC
  Administered 2015-11-05 – 2015-11-06 (×2): 3 mL via INTRAVENOUS

## 2015-11-05 MED ORDER — EPINEPHRINE HCL 0.1 MG/ML IJ SOSY
PREFILLED_SYRINGE | INTRAMUSCULAR | Status: AC
  Filled 2015-11-05: qty 10

## 2015-11-05 MED ORDER — CLOPIDOGREL BISULFATE 75 MG PO TABS
75.0000 mg | ORAL_TABLET | Freq: Every day | ORAL | Status: DC
  Administered 2015-11-06: 75 mg via ORAL
  Filled 2015-11-05: qty 1

## 2015-11-05 MED ORDER — FUROSEMIDE 20 MG PO TABS: 20.0000 mg | ORAL_TABLET | Freq: Every day | ORAL | Status: DC | PRN

## 2015-11-05 MED ORDER — INSULIN ASPART 100 UNIT/ML ~~LOC~~ SOLN: 0.0000 [IU] | Freq: Every day | SUBCUTANEOUS | Status: DC

## 2015-11-05 MED ORDER — NITROGLYCERIN 0.4 MG SL SUBL
SUBLINGUAL_TABLET | SUBLINGUAL | Status: AC
  Filled 2015-11-05: qty 1

## 2015-11-05 MED ORDER — IOPAMIDOL (ISOVUE-300) INJECTION 61%
INTRAVENOUS | Status: DC | PRN
  Administered 2015-11-05: 160 mL via INTRA_ARTERIAL

## 2015-11-05 MED ORDER — METOPROLOL TARTRATE 25 MG PO TABS
12.5000 mg | ORAL_TABLET | Freq: Two times a day (BID) | ORAL | Status: DC
  Administered 2015-11-05 – 2015-11-06 (×3): 12.5 mg via ORAL
  Filled 2015-11-05 (×3): qty 1

## 2015-11-05 MED ORDER — HEPARIN (PORCINE) IN NACL 100-0.45 UNIT/ML-% IJ SOLN
950.0000 [IU]/h | INTRAMUSCULAR | Status: DC
  Administered 2015-11-05: 950 [IU]/h via INTRAVENOUS
  Filled 2015-11-05: qty 250

## 2015-11-05 MED ORDER — MIDAZOLAM HCL 2 MG/2ML IJ SOLN
INTRAMUSCULAR | Status: AC
  Filled 2015-11-05: qty 2

## 2015-11-05 MED ORDER — SODIUM CHLORIDE 0.9 % WEIGHT BASED INFUSION
3.0000 mL/kg/h | INTRAVENOUS | Status: AC
  Administered 2015-11-05: 3 mL/kg/h via INTRAVENOUS

## 2015-11-05 MED ORDER — NITROGLYCERIN 0.4 MG SL SUBL: 0.4000 mg | SUBLINGUAL_TABLET | SUBLINGUAL | Status: DC | PRN

## 2015-11-05 MED ORDER — HEPARIN (PORCINE) IN NACL 2-0.9 UNIT/ML-% IJ SOLN
INTRAMUSCULAR | Status: AC
  Filled 2015-11-05: qty 500

## 2015-11-05 MED ORDER — ACETAMINOPHEN 325 MG PO TABS: 650.0000 mg | ORAL_TABLET | ORAL | Status: DC | PRN

## 2015-11-05 MED ORDER — LABETALOL HCL 5 MG/ML IV SOLN: 5.0000 mg | INTRAVENOUS | Status: DC | PRN

## 2015-11-05 MED ORDER — CLOPIDOGREL BISULFATE 75 MG PO TABS: 300.0000 mg | ORAL_TABLET | Freq: Once | ORAL | Status: DC

## 2015-11-05 MED ORDER — SODIUM CHLORIDE 0.9% FLUSH: 3.0000 mL | INTRAVENOUS | Status: DC | PRN

## 2015-11-05 MED ORDER — FENTANYL CITRATE (PF) 100 MCG/2ML IJ SOLN
INTRAMUSCULAR | Status: AC
  Filled 2015-11-05: qty 2

## 2015-11-05 MED ORDER — SODIUM CHLORIDE 0.9% FLUSH
3.0000 mL | Freq: Two times a day (BID) | INTRAVENOUS | Status: DC
Start: 2015-11-05 — End: 2015-11-05

## 2015-11-05 MED ORDER — ATROPINE SULFATE 1 MG/10ML IJ SOSY
PREFILLED_SYRINGE | INTRAMUSCULAR | Status: AC
Start: 2015-11-05 — End: 2015-11-05
  Filled 2015-11-05: qty 10

## 2015-11-05 MED ORDER — SODIUM CHLORIDE 0.9 % WEIGHT BASED INFUSION: 1.0000 mL/kg/h | INTRAVENOUS | Status: DC

## 2015-11-05 MED ORDER — NITROGLYCERIN 0.4 MG SL SUBL
SUBLINGUAL_TABLET | SUBLINGUAL | Status: DC | PRN
  Administered 2015-11-05: .4 mg via SUBLINGUAL

## 2015-11-05 MED ORDER — FENOFIBRATE 160 MG PO TABS
160.0000 mg | ORAL_TABLET | Freq: Every day | ORAL | Status: DC
Start: 2015-11-06 — End: 2015-11-06
  Administered 2015-11-06: 160 mg via ORAL
  Filled 2015-11-05: qty 1

## 2015-11-05 MED ORDER — ALBUTEROL SULFATE (2.5 MG/3ML) 0.083% IN NEBU
2.5000 mg | INHALATION_SOLUTION | RESPIRATORY_TRACT | Status: DC | PRN
  Administered 2015-11-05: 2.5 mg via RESPIRATORY_TRACT
  Filled 2015-11-05: qty 3

## 2015-11-05 MED ORDER — HEPARIN BOLUS VIA INFUSION
4000.0000 [IU] | Freq: Once | INTRAVENOUS | Status: AC
  Administered 2015-11-05: 4000 [IU] via INTRAVENOUS
  Filled 2015-11-05: qty 4000

## 2015-11-05 MED ORDER — ONDANSETRON HCL 4 MG PO TABS: 4.0000 mg | ORAL_TABLET | Freq: Four times a day (QID) | ORAL | Status: DC | PRN

## 2015-11-05 MED ORDER — SODIUM CHLORIDE 0.9 % IV SOLN: 250.0000 mL | INTRAVENOUS | Status: DC | PRN

## 2015-11-05 MED ORDER — INSULIN GLARGINE 100 UNIT/ML ~~LOC~~ SOLN
30.0000 [IU] | Freq: Every day | SUBCUTANEOUS | Status: DC
  Filled 2015-11-05: qty 0.3

## 2015-11-05 MED ORDER — ONDANSETRON HCL 4 MG/2ML IJ SOLN
4.0000 mg | Freq: Four times a day (QID) | INTRAMUSCULAR | Status: DC | PRN
  Administered 2015-11-05 – 2015-11-06 (×2): 4 mg via INTRAVENOUS
  Filled 2015-11-05 (×2): qty 2

## 2015-11-05 MED ORDER — OXYCODONE HCL 5 MG PO TABS: 5.0000 mg | ORAL_TABLET | ORAL | Status: DC | PRN

## 2015-11-05 MED ORDER — INSULIN ASPART 100 UNIT/ML ~~LOC~~ SOLN
0.0000 [IU] | Freq: Three times a day (TID) | SUBCUTANEOUS | Status: DC
Start: 2015-11-05 — End: 2015-11-06
  Administered 2015-11-05 (×2): 2 [IU] via SUBCUTANEOUS
  Administered 2015-11-06: 1 [IU] via SUBCUTANEOUS
  Administered 2015-11-06: 2 [IU] via SUBCUTANEOUS
  Filled 2015-11-05: qty 1
  Filled 2015-11-05 (×3): qty 2

## 2015-11-05 MED ORDER — DOCUSATE SODIUM 100 MG PO CAPS: 100.0000 mg | ORAL_CAPSULE | Freq: Two times a day (BID) | ORAL | Status: DC

## 2015-11-05 MED ORDER — ONDANSETRON HCL 4 MG/2ML IJ SOLN: 4.0000 mg | Freq: Four times a day (QID) | INTRAMUSCULAR | Status: DC | PRN

## 2015-11-05 MED ORDER — GUAIFENESIN-DM 100-10 MG/5ML PO SYRP
5.0000 mL | ORAL_SOLUTION | ORAL | Status: DC | PRN
Start: 2015-11-05 — End: 2015-11-06
  Administered 2015-11-06: 5 mL via ORAL
  Filled 2015-11-05: qty 5

## 2015-11-05 MED ORDER — HYDRALAZINE HCL 20 MG/ML IJ SOLN
INTRAMUSCULAR | Status: DC | PRN
  Administered 2015-11-05: 10 mg via INTRAVENOUS

## 2015-11-05 MED ORDER — SODIUM CHLORIDE 0.9 % WEIGHT BASED INFUSION: 3.0000 mL/kg/h | INTRAVENOUS | Status: DC

## 2015-11-05 MED ORDER — HYDROCOD POLST-CPM POLST ER 10-8 MG/5ML PO SUER
ORAL | Status: AC
  Filled 2015-11-05: qty 5

## 2015-11-05 MED ORDER — HYDRALAZINE HCL 20 MG/ML IJ SOLN
INTRAMUSCULAR | Status: AC
  Filled 2015-11-05: qty 1

## 2015-11-05 SURGICAL SUPPLY — 15 items
CATH ANGIO 5F JB2 100CM (CATHETERS) ×3 IMPLANT
CATH INFINITI 5 FR 3DRC (CATHETERS) ×3 IMPLANT
CATH INFINITI 5 FR IM (CATHETERS) ×3 IMPLANT
CATH INFINITI 5 FR JR5 (CATHETERS) ×3 IMPLANT
CATH INFINITI 5FR ANG PIGTAIL (CATHETERS) ×3 IMPLANT
CATH INFINITI 5FR JL4 (CATHETERS) ×3 IMPLANT
DEVICE CLOSURE MYNXGRIP 5F (Vascular Products) ×3 IMPLANT
DEVICE SAFEGUARD 24CM (GAUZE/BANDAGES/DRESSINGS) ×3 IMPLANT
KIT MANI 3VAL PERCEP (MISCELLANEOUS) ×3 IMPLANT
NEEDLE PERC 18GX7CM (NEEDLE) ×3 IMPLANT
NEEDLE SMART 18G ACCESS (NEEDLE) ×3 IMPLANT
PACK CARDIAC CATH (CUSTOM PROCEDURE TRAY) ×3 IMPLANT
SHEATH AVANTI 5FR X 11CM (SHEATH) ×3 IMPLANT
WIRE EMERALD 3MM-J .035X150CM (WIRE) ×6 IMPLANT
WIRE EMERALD 3MM-J .035X260CM (WIRE) ×3 IMPLANT

## 2015-11-05 NOTE — Progress Notes (Signed)
Patient arrived to 2A Room 258. Patient denies pain and all questions answered. Patient oriented to unit and Fall Safety Plan signed. Skin assessment completed with [facility] RN and skin intact. A&Ox4, VSS, and NSR on verified tele-box #[id number]. Nursing staff will continue to monitor for any changes in patient status. Lamonte Richer, RN

## 2015-11-05 NOTE — Progress Notes (Signed)
Patient returned from cath with severe nausea with clear emesis. Refused taking any medication multiple times. Thinks symptoms are due to not eating anything and not being sedated during the catheterization. Patient has had some ginger ale and is working on eating saltines, her nausea has improved some. Site is clean dry and intact with no hematoma. BP is elevated due to nausea, will re-check.

## 2015-11-05 NOTE — H&P (Signed)
[name] is an 70 y.o. female.   Chief Complaint: Chest pain HPI: The patient with past medical history significant for diabetes and coronary artery disease status post CABG and PCI presents to the emergency department complaining of chest pain. Her pain began substernally after eating soup for dinner tonight. The pain radiates to her back. She acknowledges some nausea but denies vomiting. She recalls feeling as if "300% on her chest" but she cannot recall if she had any shortness of breath. She denies orthopnea or lower extremity edema but she admits to having cough and congestion for the last week and a half. In the emergency Department laboratory evaluation revealed elevated troponin level. The patient's chest pain was only relieved following fentanyl and nitroglycerin. Due to these findings emergency department staff called the hospitalist service for admission.  Past Medical History:  Diagnosis Date  . Anginal pain ([place])   . Arthritis    "hands, back" (12/09/2013)  . Aspirin allergy   . CAD (coronary artery disease)    S/P CABG January 2012; had poor target vessels for grafting.  A cath  9/13, LAD 100%, diagonal occluded, diffuse second marginal 90% stenosis, first obtuse marginal occluded, nondominant right coronary occluded, saphenous vein graft to the diagonal occluded, saphenous vein graft to OM 40% stenosis, LIMA to the LAD patent .  Patent circumflex stents and unchanged anatomy 03/13/14  . DVT (deep venous thrombosis) (HCC) 1970's   LLE  . HLD (hyperlipidemia)    statin intolerant  . Hypertension   . Obesity   . Orthostatic hypotension    previous Midodrine therapy  . Pneumonia 2013?  Marland Kitchen PVD (peripheral vascular disease) ([place])    prior balloon PCI to left leg in [place]  . Type II diabetes mellitus ([place])     Past Surgical History:  Procedure Laterality Date  . ANGIOPLASTY / STENTING FEMORAL Left 03/14/2011  . APPENDECTOMY    . CARDIAC CATHETERIZATION  03/2010; 2013   . Carotid dopplers  03/2010   40-59% bilateral ICA stenosis  . CATARACT EXTRACTION W/ INTRAOCULAR LENS  IMPLANT, BILATERAL Bilateral   . CHOLECYSTECTOMY  2012  . CORONARY ANGIOPLASTY WITH STENT PLACEMENT  12/09/2013   circumflex was stented 2.75 x 16 overlapping 2.75 x 8 promus drug-eluting stents  . CORONARY ARTERY BYPASS GRAFT  03/2010   CABG X3  . LEFT HEART CATHETERIZATION WITH CORONARY/GRAFT ANGIOGRAM N/A 11/21/2011   Procedure: LEFT HEART CATHETERIZATION WITH Beatrix Fetters;  Surgeon: Burnell Blanks, MD;  Location: [facility] CATH LAB;  Service: Cardiovascular;  Laterality: N/A;  . LEFT HEART CATHETERIZATION WITH CORONARY/GRAFT ANGIOGRAM N/A 12/09/2013   Procedure: LEFT HEART CATHETERIZATION WITH Beatrix Fetters;  Surgeon: Jettie Booze, MD;  Location: [facility] CATH LAB;  Service: Cardiovascular;  Laterality: N/A;  . LEFT HEART CATHETERIZATION WITH CORONARY/GRAFT ANGIOGRAM N/A 03/13/2014   Procedure: LEFT HEART CATHETERIZATION WITH Beatrix Fetters;  Surgeon: Peter M Martinique, MD;  Location: [facility] CATH LAB;  Service: Cardiovascular;  Laterality: N/A;  . PERCUTANEOUS CORONARY STENT INTERVENTION (PCI-S)  12/09/2013   Procedure: PERCUTANEOUS CORONARY STENT INTERVENTION (PCI-S);  Surgeon: Jettie Booze, MD;  Location: [facility] CATH LAB;  Service: Cardiovascular;;  . REFRACTIVE SURGERY     laser  . RIGHT HEART CATHETERIZATION  11/21/2011   Procedure: RIGHT HEART CATH;  Surgeon: Burnell Blanks, MD;  Location: [facility] CATH LAB;  Service: Cardiovascular;;  . TONSILLECTOMY    . TUBAL LIGATION Bilateral   . VITRECTOMY Right 2015    Family History  Problem Relation  Age of Onset  . Esophageal cancer Mother   . Stroke Mother   . Heart attack Father     MI  . Heart attack Brother     CABG x 4   . Heart disease Brother     valve replacement   Social History:  reports that she has never smoked. She has never used smokeless tobacco. She reports that she does not drink  alcohol or use drugs.  Allergies:  Allergies  Allergen Reactions  . Bee Venom Anaphylaxis  . Statins Other (See Comments)    Crippling  . Lisinopril Other (See Comments)    Fluctuations BP  . Tylenol [Acetaminophen]   . Aspirin Hives  . Atorvastatin Other (See Comments)    Arthralgias  . Codeine Hives and Nausea Only  . Cortisone Hives  . Demerol Hives  . Meperidine Hives and Nausea And Vomiting  . Meperidine Hcl Hives  . Metformin Other (See Comments)    Intolerance  . Prednisone Hives    Medications Prior to Admission  Medication Sig Dispense Refill  . furosemide (LASIX) 20 MG tablet Take 1 tablet (20 mg total) by mouth daily as needed for fluid. 30 tablet 9  . insulin NPH-regular Human (NOVOLIN 70/30) (70-30) 100 UNIT/ML injection Inject 40 Units into the skin 2 (two) times daily with a meal.    . NITROSTAT 0.4 MG SL tablet PLACE 1 TABLET UNDER TONGUE EVERY 5 MIN AS NEEDED FOR CHEST PAIN IF NO RELIEF IN15 MIN CALL 911 (MAX 3 TABS) 100 tablet 0  . Polyethyl Glycol-Propyl Glycol (SYSTANE OP) Place 1 drop into both eyes every morning.      Results for orders placed or performed during the hospital encounter of 11/04/15 (from the past 48 hour(s))  CBC with Differential/Platelet     Status: None   Collection Time: 11/04/15  8:54 PM  Result Value Ref Range   WBC 7.0 3.6 - 11.0 K/uL   RBC 4.70 3.80 - 5.20 MIL/uL   Hemoglobin 13.8 12.0 - 16.0 g/dL   HCT 40.0 35.0 - 47.0 %   MCV 84.9 80.0 - 100.0 fL   MCH 29.4 26.0 - 34.0 pg   MCHC 34.6 32.0 - 36.0 g/dL   RDW 13.5 11.5 - 14.5 %   Platelets 224 150 - 440 K/uL   Neutrophils Relative % 55 %   Neutro Abs 3.9 1.4 - 6.5 K/uL   Lymphocytes Relative 35 %   Lymphs Abs 2.5 1.0 - 3.6 K/uL   Monocytes Relative 6 %   Monocytes Absolute 0.4 0.2 - 0.9 K/uL   Eosinophils Relative 3 %   Eosinophils Absolute 0.2 0 - 0.7 K/uL   Basophils Relative 1 %   Basophils Absolute 0.1 0 - 0.1 K/uL  Comprehensive metabolic panel     Status:  Abnormal   Collection Time: 11/04/15  8:54 PM  Result Value Ref Range   Sodium 140 135 - 145 mmol/L   Potassium 4.1 3.5 - 5.1 mmol/L   Chloride 107 101 - 111 mmol/L   CO2 22 22 - 32 mmol/L   Glucose, Bld 177 (H) 65 - 99 mg/dL   BUN 18 6 - 20 mg/dL   Creatinine, Ser 0.70 0.44 - 1.00 mg/dL   Calcium 9.1 8.9 - 10.3 mg/dL   Total Protein 7.5 6.5 - 8.1 g/dL   Albumin 3.8 3.5 - 5.0 g/dL   AST 21 15 - 41 U/L   ALT 17 14 - 54 U/L   Alkaline  Phosphatase 57 38 - 126 U/L   Total Bilirubin 0.2 (L) 0.3 - 1.2 mg/dL   GFR calc non Af Amer >60 >60 mL/min   GFR calc Af Amer >60 >60 mL/min    Comment: (NOTE) The eGFR has been calculated using the CKD EPI equation. This calculation has not been validated in all clinical situations. eGFR's persistently <60 mL/min signify possible Chronic Kidney Disease.    Anion gap 11 5 - 15  Troponin I     Status: None   Collection Time: 11/04/15  8:54 PM  Result Value Ref Range   Troponin I <0.03 <0.03 ng/mL  Troponin I     Status: Abnormal   Collection Time: 11/05/15 12:10 AM  Result Value Ref Range   Troponin I 0.14 (HH) <0.03 ng/mL    Comment: CRITICAL RESULT CALLED TO, READ BACK BY AND VERIFIED WITH TERESA HUDSON ON 11/04/15 AT 0045 BY TLB   Glucose, capillary     Status: Abnormal   Collection Time: 11/05/15  2:40 AM  Result Value Ref Range   Glucose-Capillary 151 (H) 65 - 99 mg/dL  APTT     Status: Abnormal   Collection Time: 11/05/15  4:30 AM  Result Value Ref Range   aPTT 38 (H) 24 - 36 seconds    Comment:        IF BASELINE aPTT IS ELEVATED, SUGGEST PATIENT RISK ASSESSMENT BE USED TO DETERMINE APPROPRIATE ANTICOAGULANT THERAPY.   Protime-INR     Status: None   Collection Time: 11/05/15  4:30 AM  Result Value Ref Range   Prothrombin Time 13.9 11.4 - 15.2 seconds   INR 1.07    Dg Chest 1 View  Result Date: 11/04/2015 CLINICAL DATA:  Acute chest pain. Chest pain and pressure onset 2 hours ago. Productive cough. Concern for pneumothorax,  pneumonia, or CHF. EXAM: CHEST 1 VIEW COMPARISON:  Frontal and lateral views 11/16/2011 FINDINGS: Patient is post median sternotomy. Borderline cardiomegaly, heart size likely accentuated by technique. No pulmonary edema, focal airspace opacity, pneumothorax. Probable left basilar scarring. Bilateral calcified breast prostheses. No evidence of acute osseous abnormality. IMPRESSION: Post median sternotomy with borderline cardiomegaly. Left basilar scarring. No acute abnormality. Electronically Signed   By: Jeb Levering M.D.   On: 11/04/2015 21:03    Review of Systems  Constitutional: Positive for diaphoresis. Negative for chills and fever.  HENT: Positive for congestion. Negative for sore throat and tinnitus.   Eyes: Negative for blurred vision and redness.  Respiratory: Positive for cough and sputum production (scant). Negative for shortness of breath.   Cardiovascular: Positive for chest pain. Negative for palpitations, orthopnea and PND.  Gastrointestinal: Positive for nausea. Negative for abdominal pain, diarrhea and vomiting.  Genitourinary: Negative for dysuria, frequency and urgency.  Musculoskeletal: Negative for joint pain and myalgias.  Skin: Negative for rash.       No lesions  Neurological: Negative for speech change, focal weakness and weakness.  Endo/Heme/Allergies: Does not bruise/bleed easily.       No temperature intolerance  Psychiatric/Behavioral: Negative for depression and suicidal ideas.    Blood pressure (!) 145/51, pulse 87, temperature 98 F (36.7 C), temperature source Oral, resp. rate 18, height _0  (1.676 m), weight 97 kg (213 lb 12.8 oz), SpO2 97 %. Physical Exam  Nursing note and vitals reviewed. Constitutional: She is oriented to person, place, and time. She appears well-developed and well-nourished. No distress.  HENT:  Head: Normocephalic and atraumatic.  Mouth/Throat: Oropharynx is clear and moist.  Eyes: Conjunctivae and EOM are normal. Pupils are  equal, round, and reactive to light. No scleral icterus.  Neck: Normal range of motion. Neck supple. No JVD present. No tracheal deviation present. No thyromegaly present.  Cardiovascular: Normal rate, regular rhythm and normal heart sounds.  Exam reveals no gallop and no friction rub.   No murmur heard. Respiratory: Effort normal and breath sounds normal.  GI: Soft. Bowel sounds are normal. She exhibits no distension. There is no tenderness.  Genitourinary:  Genitourinary Comments: Deferred  Musculoskeletal: Normal range of motion. She exhibits no edema.  Lymphadenopathy:    She has no cervical adenopathy.  Neurological: She is alert and oriented to person, place, and time. No cranial nerve deficit. She exhibits normal muscle tone.  Skin: Skin is warm and dry. No rash noted. No erythema.  Psychiatric: She has a normal mood and affect. Her behavior is normal. Judgment and thought content normal.     Assessment/Plan This is a 70 year old female admitted for NSTEMI. 1. NSTEMI: Continue to follow troponin; monitor telemetry. The patient is allergic to aspirin. I have considered loading her with Plavix in addition to starting heparin drip but the patient declines former. Cardiology consult placed. Labetalol as needed to reduce myocardial oxygen demand. 2. Coronary artery disease: Unstable; extensive. Continue heparin. Nitroglycerin as needed 3. Diabetes mellitus type 2: I have held the patient's oral hypoglycemics and reduced the patient's basal insulin dose and added sliding scale insulin as necessary. 4. DVT prophylaxis: Therapeutic anticoagulation as above 5. GI prophylaxis: None The patient is a DO NOT RESUSCITATE. Time spent on admission orders and patient care approximately 45 minutes  Harrie Foreman, MD 11/05/2015, 6:19 AM

## 2015-11-05 NOTE — Consult Note (Signed)
Cardiology Consult    Patient ID: NEHEMIE NOREEN MRN: [id number], DOB/AGE: 72-Nov-1947   Admit date: 11/04/2015 Date of Consult: 11/05/2015  Primary Physician: [name], MD Primary Cardiologist: Shela Commons. Hochrein, MD  Requesting Provider: V. Sherryll Burger, MD  Patient Profile    70 y/o ? with a h/o CAD s/p CABG and subsequent PCI, HTN, HL, DM, carotid & PVD, and multiple drug intolerances, who presented to the ED on 8/23 w/ chest pain and was noted to have an elevated troponin.   Past Medical History   Past Medical History:  Diagnosis Date  . Arthritis    "hands, back" (12/09/2013)  . Aspirin allergy   . CAD (coronary artery disease)    a. 03/2010 s/p CABG x 3 (LIMA->LAD, VG->OM, VG->Diag);  b. 9/15 PCI native LCX (2.75x16 & 2.75x8 Promus DES');  c. 02/2014 Cath: LM nl, LAD 80-90/100p, LCX patent stents, OM1 100, OM2 99ost, RCA 100p, VG->Diag 100, VG->OM nl, LIMA->LAD nl, EF 55-65%-->Med Rx.  . Carotid arterial disease (HCC)    a. 08/2015 U/S: RICA 40-59%, LICA 1-39%-->f/u 1 yr.  . Chronic stable angina (HCC)   . Diastolic dysfunction    a. 01/2014 Echo: EF 55%, Gr1 DD, triv MR.  . DVT (deep venous thrombosis) (HCC) 1970's   LLE  . HLD (hyperlipidemia)    statin intolerant  . Hypertensive heart disease   . Obesity   . Orthostatic hypotension    previous Midodrine therapy  . Pneumonia 2013?  Marland Kitchen PVD (peripheral vascular disease) (HCC)    a. prior balloon PTA to left leg in [place].  . Type II diabetes mellitus (HCC)     Past Surgical History:  Procedure Laterality Date  . ANGIOPLASTY / STENTING FEMORAL Left 03/14/2011  . APPENDECTOMY    . CARDIAC CATHETERIZATION  03/2010; 2013  . Carotid dopplers  03/2010   40-59% bilateral ICA stenosis  . CATARACT EXTRACTION W/ INTRAOCULAR LENS  IMPLANT, BILATERAL Bilateral   . CHOLECYSTECTOMY  2012  . CORONARY ANGIOPLASTY WITH STENT PLACEMENT  12/09/2013   circumflex was stented 2.75 x 16 overlapping 2.75 x 8 promus drug-eluting stents  .  CORONARY ARTERY BYPASS GRAFT  03/2010   CABG X3  . LEFT HEART CATHETERIZATION WITH CORONARY/GRAFT ANGIOGRAM N/A 11/21/2011   Procedure: LEFT HEART CATHETERIZATION WITH Isabel Caprice;  Surgeon: Kathleene Hazel, MD;  Location: [facility] CATH LAB;  Service: Cardiovascular;  Laterality: N/A;  . LEFT HEART CATHETERIZATION WITH CORONARY/GRAFT ANGIOGRAM N/A 12/09/2013   Procedure: LEFT HEART CATHETERIZATION WITH Isabel Caprice;  Surgeon: Corky Crafts, MD;  Location: [facility] CATH LAB;  Service: Cardiovascular;  Laterality: N/A;  . LEFT HEART CATHETERIZATION WITH CORONARY/GRAFT ANGIOGRAM N/A 03/13/2014   Procedure: LEFT HEART CATHETERIZATION WITH Isabel Caprice;  Surgeon: Peter M Swaziland, MD;  Location: [facility] CATH LAB;  Service: Cardiovascular;  Laterality: N/A;  . PERCUTANEOUS CORONARY STENT INTERVENTION (PCI-S)  12/09/2013   Procedure: PERCUTANEOUS CORONARY STENT INTERVENTION (PCI-S);  Surgeon: Corky Crafts, MD;  Location: [facility] CATH LAB;  Service: Cardiovascular;;  . REFRACTIVE SURGERY     laser  . RIGHT HEART CATHETERIZATION  11/21/2011   Procedure: RIGHT HEART CATH;  Surgeon: Kathleene Hazel, MD;  Location: [facility] CATH LAB;  Service: Cardiovascular;;  . TONSILLECTOMY    . TUBAL LIGATION Bilateral   . VITRECTOMY Right 2015     Allergies  Allergies  Allergen Reactions  . Bee Venom Anaphylaxis  . Statins Other (See Comments)    Crippling  . Lisinopril Other (See Comments)  Fluctuations BP  . Tylenol [Acetaminophen]   . Aspirin Hives  . Atorvastatin Other (See Comments)    Arthralgias  . Codeine Hives and Nausea Only  . Cortisone Hives  . Demerol Hives  . Meperidine Hives and Nausea And Vomiting  . Meperidine Hcl Hives  . Metformin Other (See Comments)    Intolerance  . Prednisone Hives    History of Present Illness    70 y/o ? with the above complex PMH including CAD s/p CABG x 3 in 2012 followed by PCI of the native LCX in 11/2013.  Last cath in  02/2014 revealed 2/3 patent grafts (known occlusion of the VG  Diag) and patent LCX stents.  She has been medically managed since.  Other h/o includes PAD s/p LLE PTA, stable bilat carotid dzs, HTN, HL, DMII, obesity, and multiple drug intolerances (including statins/zetia).  She was recently seen in clinic by Dr. Antoine PocheHochrein in May @ which time she reported stable angina, requiring sl NTG a few x/wk.  She says that following that visit, she stopped taking prn tylenol and noticed a significant improvement in her energy levels and complete resolution of chest pain.  She stopped requiring ntg altogether and had improved exercise tolerance.  Ms. Allison QuarryCobb was in her usual state of health until about a week to 10 days prior to admission, when she began to experience cough and upper respiratory congestion in the absence of fevers/chills.  On 8/23, she developed severe left sided chest and left shoulder pain that occurred shortly after eating soup for dinner.  She noted mild associated nausea.  Due to ongoing chest and left should pain, she presented [facility] where ECG was non-acute.  Troponin was initially normal but has since rise to 0.14  0.18.  We've been asked to eval. She is currently chest pain free.  Inpatient Medications    . clopidogrel  300 mg Oral Once  . [START ON 11/06/2015] clopidogrel  75 mg Oral Daily  . docusate sodium  100 mg Oral BID  . insulin aspart  0-5 Units Subcutaneous QHS  . insulin aspart  0-9 Units Subcutaneous TID WC  . insulin glargine  30 Units Subcutaneous QHS  . nitroGLYCERIN  0-200 mcg/min Intravenous Once  . sodium chloride flush  3 mL Intravenous Q12H    Family History    Family History  Problem Relation Age of Onset  . Esophageal cancer Mother   . Stroke Mother   . Heart attack Father     MI  . Heart attack Brother     CABG x 4   . Heart disease Brother     valve replacement    Social History    Social History   Social History  . Marital status: Widowed     Spouse name: N/A  . Number of children: N/A  . Years of education: N/A   Occupational History  . Self-employed Self Employed   Social History Main Topics  . Smoking status: Never Smoker  . Smokeless tobacco: Never Used  . Alcohol use No  . Drug use: No  . Sexual activity: No   Other Topics Concern  . Not on file   Social History Narrative   [place] in [place].  Three grown children.       Review of Systems    General:  No chills, fever, night sweats or weight changes.  Cardiovascular:  +++ chest pain, no dyspnea on exertion, edema, orthopnea, palpitations, paroxysmal nocturnal  dyspnea. Dermatological: No rash, lesions/masses Respiratory: No cough, dyspnea Urologic: No hematuria, dysuria Abdominal:   +++ mild nausea prior to admission.  No vomiting, diarrhea, bright red blood per rectum, melena, or hematemesis Neurologic:  No visual changes, wkns, changes in mental status. All other systems reviewed and are otherwise negative except as noted above.  Physical Exam    Blood pressure (!) 145/51, pulse 87, temperature 98 F (36.7 C), temperature source Oral, resp. rate 18, height 5\' 6"  (1.676 m), weight 213 lb 12.8 oz (97 kg), SpO2 97 %.  General: Pleasant, NAD Psych: Normal affect. Neuro: Alert and oriented X 3. Moves all extremities spontaneously. HEENT: Normal  Neck: Supple without bruits or JVD. Lungs:  Resp regular and unlabored, CTA. Heart: RRR no s3, s4, or murmurs. Abdomen: Soft, non-tender, non-distended, BS + x 4.  Extremities: No clubbing, cyanosis or edema. DP/PT/Radials 2+ and equal bilaterally.  Labs     Recent Labs  11/04/15 2054 11/05/15 0010 11/05/15 0430  TROPONINI <0.03 0.14* 0.18*   Lab Results  Component Value Date   WBC 7.0 11/04/2015   HGB 13.8 11/04/2015   HCT 40.0 11/04/2015   MCV 84.9 11/04/2015   PLT 224 11/04/2015    Recent Labs Lab 11/04/15 2054  NA 140  K 4.1  CL 107  CO2 22  BUN 18  CREATININE 0.70    CALCIUM 9.1  PROT 7.5  BILITOT 0.2*  ALKPHOS 57  ALT 17  AST 21  GLUCOSE 177*   Lab Results  Component Value Date   CHOL 289 (H) 11/20/2011   HDL 48 10/29/2014   LDLCALC 198 (H) 11/20/2011   TRIG 240 (H) 10/29/2014    Radiology Studies    Dg Chest 1 View  Result Date: 11/04/2015 CLINICAL DATA:  Acute chest pain. Chest pain and pressure onset 2 hours ago. Productive cough. Concern for pneumothorax, pneumonia, or CHF. EXAM: CHEST 1 VIEW COMPARISON:  Frontal and lateral views 11/16/2011 FINDINGS: Patient is post median sternotomy. Borderline cardiomegaly, heart size likely accentuated by technique. No pulmonary edema, focal airspace opacity, pneumothorax. Probable left basilar scarring. Bilateral calcified breast prostheses. No evidence of acute osseous abnormality. IMPRESSION: Post median sternotomy with borderline cardiomegaly. Left basilar scarring. No acute abnormality. Electronically Signed   By: Rubye Oaks M.D.   On: 11/04/2015 21:03    ECG & Cardiac Imaging    RSR, 94, leftward axis, delayed R progression, inflat upsloping ST depression - no acute changes.  Assessment & Plan    1.  NSTEMI/CAD:  70 y/o ? with a h/o CAD s/p CABG x 3 in 2012 followed by PCI/DES x 2 to the native LCX in 11/2013.  Last cath in 02/2014 revealed stable anatomy with 2/3 patent grafts (known prior occlusion of the VG  Diag), patent LCX stents, and known, severe native LAD and RCA dzs.  She has been medically managed since and has had stable, nitrate responsive angina over the past few years.  She had been doing well over the past three months but developed severe left sided chest and shoulder pain after eating dinner on 8/23 and has since ruled in for nstemi with trop of 0.18.  She is currently c/p free on heparin.  Plan diagnostic cath this afternoon.  The patient understands that risks include but are not limited to stroke (1 in 1000), death (1 in 1000), kidney failure [usually temporary] (1 in 500),  bleeding (1 in 200), allergic reaction [possibly serious] (1 in 200), and agrees to  proceed.  No ASA 2/2/ prior intolerance.  Cont plavix.  She is intolerant to statins and zetia and could not afford pcsk9 inhibitor previously.  Add low dose  blocker.  If cath unrevealing, would consider CTA to r/o PE (prior h/o LLE DVT).  2.  Hypertensive Heart Disease:  BP elevated this AM.  Add  blocker in setting of ACS.  3.  HL:  Intolerant to statins/zetia.  Could not previously afford pcsk9 inhibitor.  Could consider fibrate if she'd be willing to take.  4.  DMII:  Per IM.  5.  PAD:  Followed by vascular surgery locally.  She has f/u with Dr. Gilda Crease scheduled.  6.  Carotid arterial dzs:  F/u u/s in 1 yr.  7.  Morbid Obesity:  Cardiac rehab.  Signed, Nicolasa Ducking, NP 11/05/2015, 8:48 AM

## 2015-11-05 NOTE — Progress Notes (Signed)
Patient to have cardiac catheterization today. Education given and consent is signed. Heparin drip currently infusing and patient has been NPO except for sips with meds. Currently chest pain free.

## 2015-11-05 NOTE — ED Notes (Signed)
Report given to floor RN. Pt taken to floor via stretcher. Vital signs stable prior to transport.  

## 2015-11-05 NOTE — Progress Notes (Signed)
ANTICOAGULATION CONSULT NOTE - Initial Consult  Pharmacy Consult for heparin drip Indication: chest pain/ACS/STEMI  Allergies  Allergen Reactions  . Bee Venom Anaphylaxis  . Statins Other (See Comments)    Crippling  . Lisinopril Other (See Comments)    Fluctuations BP  . Tylenol [Acetaminophen]   . Aspirin Hives  . Atorvastatin Other (See Comments)    Arthralgias  . Codeine Hives and Nausea Only  . Cortisone Hives  . Demerol Hives  . Meperidine Hives and Nausea And Vomiting  . Meperidine Hcl Hives  . Metformin Other (See Comments)    Intolerance  . Prednisone Hives    Patient Measurements: Height: 5\' 6"  (167.6 cm) Weight: 213 lb 12.8 oz (97 kg) IBW/kg (Calculated) : 59.3 Heparin Dosing Weight: 79kg  Vital Signs: Temp: 98 F (36.7 C) (08/24 0428) Temp Source: Oral (08/24 0428) BP: 145/51 (08/24 0428) Pulse Rate: 87 (08/24 0428)  Labs:  Recent Labs  11/04/15 2054 11/05/15 0010 11/05/15 0430  HGB 13.8  --   --   HCT 40.0  --   --   PLT 224  --   --   APTT  --   --  38*  LABPROT  --   --  13.9  INR  --   --  1.07  CREATININE 0.70  --   --   TROPONINI <0.03 0.14* 0.18*    Estimated Creatinine Clearance: 74.7 mL/min (by C-G formula based on SCr of 0.8 mg/dL).   Medical History: Past Medical History:  Diagnosis Date  . Anginal pain (HCC)   . Arthritis    "hands, back" (12/09/2013)  . Aspirin allergy   . CAD (coronary artery disease)    S/P CABG January 2012; had poor target vessels for grafting.  A cath  9/13, LAD 100%, diagonal occluded, diffuse second marginal 90% stenosis, first obtuse marginal occluded, nondominant right coronary occluded, saphenous vein graft to the diagonal occluded, saphenous vein graft to OM 40% stenosis, LIMA to the LAD patent .  Patent circumflex stents and unchanged anatomy 03/13/14  . DVT (deep venous thrombosis) (HCC) 1970's   LLE  . HLD (hyperlipidemia)    statin intolerant  . Hypertension   . Obesity   . Orthostatic  hypotension    previous Midodrine therapy  . Pneumonia 2013?  Marland Kitchen PVD (peripheral vascular disease) (HCC)    prior balloon PCI to left leg in [place]  . Type II diabetes mellitus (HCC)     Medications:    Assessment: No anticoagulation in PTA meds.  Goal of Therapy:  Heparin level 0.3-0.7 units/ml Monitor platelets by anticoagulation protocol: Yes   Plan:  4000 unit bolus and initial rate of 950 units/hr. First heparin level 8 hours after start of infusion.  Janean Eischen S 11/05/2015,6:26 AM

## 2015-11-05 NOTE — ED Notes (Signed)
Lab called critical troponin of 0.14 - Dr Dolores Frame notified

## 2015-11-05 NOTE — Progress Notes (Signed)
Cardiac catheterization report  Occluded mid LAD, known Small diffusely diseased diagonal vessels, not amenable to intervention Occluded vein graft to the diagonal, known Other vein grafts patent including LIMA graft to LAD, vein graft to ramus Stent in the circumflex is patent There is moderate to severe ostial circumflex disease, not very amenable to intervention.    Recommendations:  Medical management recommended Etiology of her troponin elevation and chest pain is unclear, possibly small vessel disease.  Patient has declined statins in the past Could consider long-acting nitrates. This could be started if patient agrees by outside cardiologist, Dr. Antoine Poche, in clinic follow-up  Patient had severe hypertension during the case, systolic pressures into the 190 range She was given subungual nitroglycerin and hydralazine 10 mg IV push During removal of groin catheter, she developed hypotension Blood pressure persisted in the 70 systolic range She was given IV fluid boluses with no dramatic improvement of her pressures Given very slow push of epi 0.5  Mg with improvement of her blood pressure Recommended that she be kept overnight for observation given nausea, malaise, hypertension  She has significant coughing during the case concerning for bronchitis  Signed, Dossie Arbour, MD, Ph.D [facility]

## 2015-11-05 NOTE — Progress Notes (Signed)
[employer] [facility] [place] [facility]   PATIENT NAME: [name]    MR#:  [id number]  DATE OF BIRTH:  Nov 02, 1945  SUBJECTIVE:  CHIEF COMPLAINT:   Chief Complaint  Patient presents with  . Chest Pain  post cath not feeling well, very nauseous, coughing, just not quite right (can't explain clearly). Says her BP dropped in 70s while in cath lab but now in 180s, All family at bedside REVIEW OF SYSTEMS:  Review of Systems  Constitutional: Negative for chills, fever and weight loss.  HENT: Negative for nosebleeds and sore throat.   Eyes: Negative for blurred vision.  Respiratory: Positive for cough. Negative for shortness of breath and wheezing.   Cardiovascular: Negative for chest pain, orthopnea, leg swelling and PND.  Gastrointestinal: Positive for nausea. Negative for abdominal pain, constipation, diarrhea, heartburn and vomiting.  Genitourinary: Negative for dysuria and urgency.  Musculoskeletal: Negative for back pain.  Skin: Negative for rash.  Neurological: Negative for dizziness, speech change, focal weakness and headaches.  Endo/Heme/Allergies: Does not bruise/bleed easily.  Psychiatric/Behavioral: Negative for depression.   DRUG ALLERGIES:   Allergies  Allergen Reactions  . Bee Venom Anaphylaxis  . Statins Other (See Comments)    Crippling  . Lisinopril Other (See Comments)    Fluctuations BP  . Tylenol [Acetaminophen]   . Aspirin Hives  . Atorvastatin Other (See Comments)    Arthralgias  . Codeine Hives and Nausea Only  . Cortisone Hives  . Demerol Hives  . Meperidine Hives and Nausea And Vomiting  . Meperidine Hcl Hives  . Metformin Other (See Comments)    Intolerance  . Prednisone Hives   VITALS:  Blood pressure (!) 188/75, pulse 99, temperature 98.3 F (36.8 C), temperature source Oral, resp. rate 20, height 5\' 6"  (1.676 m), weight 96.6 kg (212 lb 14.4 oz), SpO2 93 %. PHYSICAL EXAMINATION:  Physical Exam  Constitutional: She is  oriented to person, place, and time and well-developed, well-nourished, and in no distress.  HENT:  Head: Normocephalic and atraumatic.  Eyes: Conjunctivae and EOM are normal. Pupils are equal, round, and reactive to light.  Neck: Normal range of motion. Neck supple. No tracheal deviation present. No thyromegaly present.  Cardiovascular: Normal rate, regular rhythm and normal heart sounds.   Pulmonary/Chest: Effort normal and breath sounds normal. No respiratory distress. She has no wheezes. She exhibits no tenderness.  Abdominal: Soft. Bowel sounds are normal. She exhibits no distension. There is no tenderness.  Musculoskeletal: Normal range of motion.  Neurological: She is alert and oriented to person, place, and time. No cranial nerve deficit.  Skin: Skin is warm and dry. No rash noted.  Psychiatric: Mood and affect normal.   LABORATORY PANEL:   CBC  Recent Labs Lab 11/04/15 2054  WBC 7.0  HGB 13.8  HCT 40.0  PLT 224   ------------------------------------------------------------------------------------------------------------------ Chemistries   Recent Labs Lab 11/04/15 2054  NA 140  K 4.1  CL 107  CO2 22  GLUCOSE 177*  BUN 18  CREATININE 0.70  CALCIUM 9.1  AST 21  ALT 17  ALKPHOS 57  BILITOT 0.2*   RADIOLOGY:  Dg Chest 1 View  Result Date: 11/04/2015 CLINICAL DATA:  Acute chest pain. Chest pain and pressure onset 2 hours ago. Productive cough. Concern for pneumothorax, pneumonia, or CHF. EXAM: CHEST 1 VIEW COMPARISON:  Frontal and lateral views 11/16/2011 FINDINGS: Patient is post median sternotomy. Borderline cardiomegaly, heart size likely accentuated by technique. No pulmonary edema, focal airspace opacity,  pneumothorax. Probable left basilar scarring. Bilateral calcified breast prostheses. No evidence of acute osseous abnormality. IMPRESSION: Post median sternotomy with borderline cardiomegaly. Left basilar scarring. No acute abnormality. Electronically Signed    By: Rubye OaksMelanie  Ehinger M.D.   On: 11/04/2015 21:03   ASSESSMENT AND PLAN:  This is a 70 year old female admitted for NSTEMI.  1. NSTEMI:  - s/p cath showing no major dz needing stenting per pt (no cath report for me to verify) - continue metoprolol, Nitro. can't take asa, plavix due to intolerance/allergy - due to her ongoing post cath symptoms will continue trending troponins to make sure she is not trending up  2. Coronary artery disease: Unstable angina - continue metoprolol, Nitro. can't take asa, plavix due to intolerance/allergy - s/p cath with no stenting (no major dz)  3. Diabetes mellitus type 2: resume basal insulin lantus and continue sliding scale insulin as necessary.  4.  Hypertension: labile - continue metoprolol and labetolol prn  5.  HL:  Intolerant to statins/zetia.  - will start fibrates (although will d/w her if she is would be ok continuing this on D/C)  6.  PAD/Carotid dz:  Followed by vascular surgery - f/u with Dr. Gilda CreaseSchnier scheduled.  7. Cough: initial cxr neg - continue nebs, will repeat cxr in am - prn cough meds.    All the records are reviewed and case discussed with Care Management/Social Worker. Management plans discussed with the patient, family and they are in agreement.  CODE STATUS: DNR  TOTAL TIME TAKING CARE OF THIS PATIENT: 35 minutes.   More than 50% of the time was spent in counseling/coordination of care: YES  POSSIBLE D/C IN 1-2 DAYS, DEPENDING ON CLINICAL CONDITION.   [facility] Center[facility]H, Skarleth Delmonico M.D on 11/05/2015 at 5:55 PM  Between 7am to 6pm - Pager - [phone]  After 6pm go to [email or web address] - Social research officer, governmentpassword EPAS [facility]  [employer] [facility]  Office  [phone]  CC: Primary care physician; Marisue IvanLINTHAVONG, [name], MD  Note: This dictation was prepared with Dragon dictation along with smaller phrase technology. Any transcriptional errors that result from this process are unintentional.

## 2015-11-06 ENCOUNTER — Encounter: Payer: Self-pay | Admitting: Cardiovascular Disease

## 2015-11-06 LAB — BASIC METABOLIC PANEL
Anion gap: 8 (ref 5–15)
BUN: 14 mg/dL (ref 6–20)
CO2: 24 mmol/L (ref 22–32)
Calcium: 8.1 mg/dL — ABNORMAL LOW (ref 8.9–10.3)
Chloride: 109 mmol/L (ref 101–111)
Creatinine, Ser: 0.59 mg/dL (ref 0.44–1.00)
GFR calc Af Amer: >60 mL/min (ref >60)
GFR calc non Af Amer: >60 mL/min (ref >60)
Glucose, Bld: 152 mg/dL — ABNORMAL HIGH (ref 65–99)
Potassium: 3.9 mmol/L (ref 3.5–5.1)
Sodium: 141 mmol/L (ref 135–145)

## 2015-11-06 LAB — CBC
HCT: 35.1 % (ref 35.0–47.0)
Hemoglobin: 12.2 g/dL (ref 12.0–16.0)
MCH: 29.7 pg (ref 26.0–34.0)
MCHC: 34.9 g/dL (ref 32.0–36.0)
MCV: 85.3 fL (ref 80.0–100.0)
Platelets: 177 10*3/uL (ref 150–440)
RBC: 4.12 MIL/uL (ref 3.80–5.20)
RDW: 13.6 % (ref 11.5–14.5)
WBC: 5.9 10*3/uL (ref 3.6–11.0)

## 2015-11-06 LAB — GLUCOSE, CAPILLARY
Glucose-Capillary: 144 mg/dL — ABNORMAL HIGH (ref 65–99)
Glucose-Capillary: 159 mg/dL — ABNORMAL HIGH (ref 65–99)

## 2015-11-06 LAB — TROPONIN I
Troponin I: 0.21 ng/mL (ref <0.03)
Troponin I: 0.18 ng/mL (ref <0.03)

## 2015-11-06 MED ORDER — INSULIN NPH ISOPHANE & REGULAR (70-30) 100 UNIT/ML ~~LOC~~ SUSP: 30.0000 [IU] | Freq: Two times a day (BID) | SUBCUTANEOUS | 11 refills | Status: DC

## 2015-11-06 MED ORDER — AZITHROMYCIN 500 MG PO TABS: 500.0000 mg | ORAL_TABLET | Freq: Every day | ORAL | 0 refills | Status: DC

## 2015-11-06 MED ORDER — METOPROLOL TARTRATE 25 MG PO TABS
12.5000 mg | ORAL_TABLET | Freq: Two times a day (BID) | ORAL | 0 refills | Status: DC
Start: 2015-11-06 — End: 2016-01-01

## 2015-11-06 MED ORDER — CLOPIDOGREL BISULFATE 75 MG PO TABS: 75.0000 mg | ORAL_TABLET | Freq: Every day | ORAL | 0 refills | Status: DC

## 2015-11-06 MED ORDER — GUAIFENESIN-DM 100-10 MG/5ML PO SYRP: 5.0000 mL | ORAL_SOLUTION | ORAL | 0 refills | Status: DC | PRN

## 2015-11-06 MED ORDER — ONDANSETRON HCL 4 MG PO TABS: 4.0000 mg | ORAL_TABLET | Freq: Three times a day (TID) | ORAL | 0 refills | Status: DC | PRN

## 2015-11-06 NOTE — Progress Notes (Signed)
Discharge instructions given. IV's and tele removed. Prescriptions electronically sent to pharmacy. Education given on new medications. MD has discontinued cholesterol medication due to patient's episode of diarrhea and nausea this afternoon. Education given on care after for cardiac cath. Questions answered and patient verbalized understanding.

## 2015-11-06 NOTE — Progress Notes (Signed)
SUBJECTIVE:  She had cardiac catheterization yesterday that showed no new obstructive disease. She had a hypotensive event after she was given hydralazine and nitroglycerin for severe hypertension during the case. She developed nausea and vomiting. No recurrent chest pain. She continues to have severe dry cough.   Vitals:   11/05/15 1627 11/05/15 1811 11/05/15 2219 11/06/15 0359  BP: (!) 188/75 (!) 153/62 (!) 105/42 (!) 129/57  Pulse: 99 79 77 81  Resp: 20   20  Temp: 98.3 F (36.8 C)   98 F (36.7 C)  TempSrc: Oral     SpO2: 93%   92%  Weight:    216 lb 8 oz (98.2 kg)  Height:        Intake/Output Summary (Last 24 hours) at 11/06/15 0910 Last data filed at 11/05/15 1900  Gross per 24 hour  Intake          1009.62 ml  Output                0 ml  Net          1009.62 ml    LABS: Basic Metabolic Panel:  Recent Labs  81/19/1408/23/17 2054 11/06/15 0419  NA 140 141  K 4.1 3.9  CL 107 109  CO2 22 24  GLUCOSE 177* 152*  BUN 18 14  CREATININE 0.70 0.59  CALCIUM 9.1 8.1*   Liver Function Tests:  Recent Labs  11/04/15 2054  AST 21  ALT 17  ALKPHOS 57  BILITOT 0.2*  PROT 7.5  ALBUMIN 3.8   No results for input(s): LIPASE, AMYLASE in the last 72 hours. CBC:  Recent Labs  11/04/15 2054 11/05/15 1949 11/06/15 0419  WBC 7.0 6.6 5.9  NEUTROABS 3.9  --   --   HGB 13.8 12.4 12.2  HCT 40.0 36.1 35.1  MCV 84.9 85.7 85.3  PLT 224 192 177   Cardiac Enzymes:  Recent Labs  11/05/15 1949 11/05/15 2332 11/06/15 0153  TROPONINI 0.24* 0.21* 0.18*   BNP: Invalid input(s): POCBNP D-Dimer: No results for input(s): DDIMER in the last 72 hours. Hemoglobin A1C:  Recent Labs  11/05/15 0430  HGBA1C 7.7*   Fasting Lipid Panel: No results for input(s): CHOL, HDL, LDLCALC, TRIG, CHOLHDL, LDLDIRECT in the last 72 hours. Thyroid Function Tests:  Recent Labs  11/05/15 0430  TSH 2.081   Anemia Panel: No results for input(s): VITAMINB12, FOLATE, FERRITIN, TIBC,  IRON, RETICCTPCT in the last 72 hours.   PHYSICAL EXAM General: Well developed, well nourished, in no acute distress HEENT:  Normocephalic and atramatic Neck:  No JVD.  Lungs: Clear bilaterally to auscultation and percussion. Heart: HRRR . Normal S1 and S2 without gallops or murmurs.  Abdomen: Bowel sounds are positive, abdomen soft and non-tender  Msk:  Back normal, normal gait. Normal strength and tone for age. Extremities: No clubbing, cyanosis or edema.   Neuro: Alert and oriented X 3. Psych:  Good affect, responds appropriately Right groin is intact with no hematoma or ecchymosis.   TELEMETRY: Reviewed telemetry pt in : Normal sinus rhythm.  ASSESSMENT AND PLAN:  1. Mildly elevated troponin likely due to supply demand ischemia in the setting of uncontrolled hypertension. Cardiac catheterization was reviewed by me which showed significant underlying three-vessel coronary artery disease with patent LIMA to LAD and SVG to ramus. Patent left circumflex stent. There is moderate ostial left circumflex stenosis which does not appear to be obstructive. The RCA is occluded but seems to be small and  likely nondominant. She does have small vessel disease affecting diagonal branches. No revascularization is advised. I recommend continuing medical therapy. She reports no improvement in symptoms in the past with long-acting nitroglycerin. I agree with a small dose beta blocker. Unfortunately, the patient has  intolerances to multiple medications. The patient can be discharged home from a cardiac standpoint.  2. Suspected acute bronchitis: The patient continues to have severe dry cough and this has been going on for more than a week. Chest x-ray showed no evidence of pneumonia. This could be contributing to some of her first chest pain symptoms. Consider treatment with an antibiotic given that her symptoms persisted for this long.  Lorine Bears, MD, [facility] 11/06/2015 9:10 AM

## 2015-11-06 NOTE — Discharge Instructions (Signed)
Carotid Artery Disease ° The carotid arteries are arteries on both sides of the neck. They carry blood to the brain. Carotid artery disease is when the arteries get smaller (narrow) or get blocked. If these arteries get smaller or get blocked, you are more likely to have a stroke or warning stroke (transient ischemic attack).  °HOME CARE °· Take medicines as told by your doctor. Make sure you understand all your medicine instructions. Do not stop your medicines without talking to your doctor first. °· Follow your doctor's diet instructions. It is important to eat a healthy diet that includes plenty of: °¨ Fresh fruits. °¨ Vegetables. °¨ Lean meats. °· Avoid: °¨ High-fat foods. °¨ High-sodium foods. °¨ Foods that are fried, overly processed, or have poor nutritional value. °· Stay a healthy weight. °· Stay active. Get at least 30 minutes of activity every day. °· Do not smoke. °· Limit alcohol use to: °¨ No more than 2 drinks a day for men. °¨ No more than 1 drink a day for women who are not pregnant. °· Do not use illegal drugs. °· Keep all doctor visits as told. °GET HELP RIGHT AWAY IF:  °· You have sudden weakness or loss of feeling (numbness) on one side of the body, such as the face, arm, or leg. °· You have sudden confusion. °· You have trouble speaking (aphasia) or understanding. °· You have sudden trouble seeing out of one or both eyes. °· You have sudden trouble walking. °· You have dizziness or feel like you might pass out (faint). °· You have a loss of balance or your movements are not steady (uncoordinated). °· You have a sudden, severe headache with no known cause. °· You have trouble swallowing (dysphagia). °Call your local emergency services (911 in [place] Do not drive yourself to the clinic or hospital.  °  °This information is not intended to replace advice given to you by your health care provider. Make sure you discuss any questions you have with your health care provider. °  °Document Released:  02/15/2012 Document Revised: 10/31/2012 Document Reviewed: 08/29/2012 °Elsevier Interactive Patient Education ©2016 [employer] ° °

## 2015-11-06 NOTE — Care Management Important Message (Signed)
Important Message  Patient Details  Name: [name] MRN: [id number] Date of Birth: 1945-07-31   Medicare Important Message Given:  Yes    Marily Memos, RN 11/06/2015, 9:03 AM

## 2015-11-08 NOTE — Discharge Summary (Signed)
[facility] [place] [facility]   PATIENT NAME: [name]    MR#:  [id number]  DATE OF BIRTH:  Jun 02, 1945  DATE OF ADMISSION:  11/04/2015   ADMITTING PHYSICIAN: Arnaldo Natal, MD  DATE OF DISCHARGE: 11/06/2015  3:47 PM  PRIMARY CARE PHYSICIAN: [name], MD   ADMISSION DIAGNOSIS:  Precordial pain [R07.2] Unstable angina (HCC) [I20.0] NSTEMI (non-ST elevated myocardial infarction) (HCC) [I21.4] Ischemic chest pain (HCC) [I20.9] DISCHARGE DIAGNOSIS:  Active Problems:   NSTEMI (non-ST elevated myocardial infarction) (HCC)   HLD (hyperlipidemia)   CAD (coronary artery disease)   Chronic stable angina (HCC)   Hypertensive heart disease   Type II diabetes mellitus (HCC)   SOB (shortness of breath)  SECONDARY DIAGNOSIS:   Past Medical History:  Diagnosis Date  . Arthritis    "hands, back" (12/09/2013)  . Aspirin allergy   . CAD (coronary artery disease)    a. 03/2010 s/p CABG x 3 (LIMA->LAD, VG->OM, VG->Diag);  b. 9/15 PCI native LCX (2.75x16 & 2.75x8 Promus DES');  c. 02/2014 Cath: LM nl, LAD 80-90/100p, LCX patent stents, OM1 100, OM2 99ost, RCA 100p, VG->Diag 100, VG->OM nl, LIMA->LAD nl, EF 55-65%-->Med Rx.  . Carotid arterial disease (HCC)    a. 08/2015 U/S: RICA 40-59%, LICA 1-39%-->f/u 1 yr.  . Chronic stable angina (HCC)   . Diastolic dysfunction    a. 01/2014 Echo: EF 55%, Gr1 DD, triv MR.  . DVT (deep venous thrombosis) (HCC) 1970's   LLE  . HLD (hyperlipidemia)    statin intolerant  . Hypertensive heart disease   . Obesity   . Orthostatic hypotension    previous Midodrine therapy  . Pneumonia 2013?  Marland Kitchen PVD (peripheral vascular disease) (HCC)    a. prior balloon PTA to left leg in .  . Type II diabetes mellitus [facility])    HOSPITAL COURSE:  This is a 70 year old female admitted for Elevate troponins  1. NSTEMI: ruled out. Mildly elevated troponin likely due to supply demand ischemia in the setting of  uncontrolled hypertension  * Cardiac catheterization showed significant underlying three-vessel coronary artery disease with patent LIMA to LAD and SVG to ramus. Patent left circumflex stent. There is moderate ostial left circumflex stenosis which does not appear to be obstructive. The RCA is occluded but seems to be small and likely nondominant. She does have small vessel disease affecting diagonal branches. No revascularization was advised. Medical mgmt recommended, Unfortunately, the patient has  intolerances to multiple medications.  2. Coronary artery disease: Unstable angina - continue metoprolol, Nitro. can't take asa, plavix due to intolerance/allergy - s/p cath with no stenting (no major dz)  3. Acute bronchitis: dry Cough:  - prn cough meds and short course of antibiotic DISCHARGE CONDITIONS:  STABLE CONSULTS OBTAINED:  Treatment Team:  Antonieta Iba, MD DRUG ALLERGIES:   Allergies  Allergen Reactions  . Bee Venom Anaphylaxis  . Statins Other (See Comments)    Crippling  . Lisinopril Other (See Comments)    Fluctuations BP  . Tylenol [Acetaminophen]   . Aspirin Hives  . Atorvastatin Other (See Comments)    Arthralgias  . Codeine Hives and Nausea Only  . Cortisone Hives  . Demerol Hives  . Meperidine Hives and Nausea And Vomiting  . Meperidine Hcl Hives  . Metformin Other (See Comments)    Intolerance  . Prednisone Hives   DISCHARGE MEDICATIONS:     Medication List    TAKE these medications   azithromycin  500 MG tablet Commonly known as:  ZITHROMAX Take 1 tablet (500 mg total) by mouth daily.   clopidogrel 75 MG tablet Commonly known as:  PLAVIX Take 1 tablet (75 mg total) by mouth daily.   furosemide 20 MG tablet Commonly known as:  LASIX Take 1 tablet (20 mg total) by mouth daily as needed for fluid.   guaiFENesin-dextromethorphan 100-10 MG/5ML syrup Commonly known as:  ROBITUSSIN DM Take 5 mLs by mouth every 4 (four) hours as needed for  cough.   insulin NPH-regular Human (70-30) 100 UNIT/ML injection Commonly known as:  NOVOLIN 70/30 Inject 30 Units into the skin 2 (two) times daily with a meal. What changed:  how much to take   metoprolol tartrate 25 MG tablet Commonly known as:  LOPRESSOR Take 0.5 tablets (12.5 mg total) by mouth 2 (two) times daily.   NITROSTAT 0.4 MG SL tablet Generic drug:  nitroGLYCERIN PLACE 1 TABLET UNDER TONGUE EVERY 5 MIN AS NEEDED FOR CHEST PAIN IF NO RELIEF IN15 MIN CALL 911 (MAX 3 TABS)   ondansetron 4 MG tablet Commonly known as:  ZOFRAN Take 1 tablet (4 mg total) by mouth every 8 (eight) hours as needed for nausea or vomiting.   SYSTANE OP Place 1 drop into both eyes every morning.      DISCHARGE INSTRUCTIONS:   DIET:  Cardiac diet DISCHARGE CONDITION:  Good ACTIVITY:  Activity as tolerated OXYGEN:  Home Oxygen: No.  Oxygen Delivery: room air DISCHARGE LOCATION:  home   If you experience worsening of your admission symptoms, develop shortness of breath, life threatening emergency, suicidal or homicidal thoughts you must seek medical attention immediately by calling 911 or calling your MD immediately  if symptoms less severe.  You Must read complete instructions/literature along with all the possible adverse reactions/side effects for all the Medicines you take and that have been prescribed to you. Take any new Medicines after you have completely understood and accpet all the possible adverse reactions/side effects.   Please note  You were cared for by a hospitalist during your hospital stay. If you have any questions about your discharge medications or the care you received while you were in the hospital after you are discharged, you can call the unit and asked to speak with the hospitalist on call if the hospitalist that took care of you is not available. Once you are discharged, your primary care physician will handle any further medical issues. Please note that NO  REFILLS for any discharge medications will be authorized once you are discharged, as it is imperative that you return to your primary care physician (or establish a relationship with a primary care physician if you do not have one) for your aftercare needs so that they can reassess your need for medications and monitor your lab values.    On the day of Discharge:  VITAL SIGNS:  Blood pressure (!) 148/60, pulse 74, temperature 98.2 F (36.8 C), temperature source Oral, resp. rate 18, height 5\' 6"  (1.676 m), weight 98.2 kg (216 lb 8 oz), SpO2 94 %. PHYSICAL EXAMINATION:  GENERAL:  70 y.o.-year-old patient lying in the bed with no acute distress.  EYES: Pupils equal, round, reactive to light and accommodation. No scleral icterus. Extraocular muscles intact.  HEENT: Head atraumatic, normocephalic. Oropharynx and nasopharynx clear.  NECK:  Supple, no jugular venous distention. No thyroid enlargement, no tenderness.  LUNGS: Normal breath sounds bilaterally, no wheezing, rales,rhonchi or crepitation. No use of accessory muscles of respiration.  CARDIOVASCULAR:  S1, S2 normal. No murmurs, rubs, or gallops.  ABDOMEN: Soft, non-tender, non-distended. Bowel sounds present. No organomegaly or mass.  EXTREMITIES: No pedal edema, cyanosis, or clubbing.  NEUROLOGIC: Cranial nerves II through XII are intact. Muscle strength 5/5 in all extremities. Sensation intact. Gait not checked.  PSYCHIATRIC: The patient is alert and oriented x 3.  SKIN: No obvious rash, lesion, or ulcer.  DATA REVIEW:   CBC  Recent Labs Lab 11/06/15 0419  WBC 5.9  HGB 12.2  HCT 35.1  PLT 177    Chemistries   Recent Labs Lab 11/04/15 2054 11/06/15 0419  NA 140 141  K 4.1 3.9  CL 107 109  CO2 22 24  GLUCOSE 177* 152*  BUN 18 14  CREATININE 0.70 0.59  CALCIUM 9.1 8.1*  AST 21  --   ALT 17  --   ALKPHOS 57  --   BILITOT 0.2*  --     Follow-up Information    Rollene RotundaJames Hochrein, MD. Go on 11/13/2017.   Specialty:   Cardiology Why:  appt at 8:00am Contact information: [address] [place] [address] [phone]        Marisue IvanLINTHAVONG, [name], MD. Go on 11/10/2015.   Specialty:  Family Medicine Why:  Hospital Follow-up: appt at 1:45pm Contact information: [address] [facility] [place] [address] [phone]           Management plans discussed with the patient, family and they are in agreement.  CODE STATUS: DNR  TOTAL TIME TAKING CARE OF THIS PATIENT: 45 minutes.    [facility], Badr Piedra M.D on 11/08/2015 at 3:57 PM  Between 7am to 6pm - Pager - [phone]  After 6pm go to [email or web address] - Social research officer, governmentpassword [email or web address] [email or web address]  [facility] [place] [facility]  Office  [phone]  CC: Primary care physician; Marisue IvanLINTHAVONG, [name], MD   Note: This dictation was prepared with Dragon dictation along with smaller phrase technology. Any transcriptional errors that result from this process are unintentional.

## 2015-11-12 ENCOUNTER — Telehealth: Payer: Self-pay | Admitting: Cardiovascular Disease

## 2015-11-12 NOTE — Telephone Encounter (Signed)
Dr. Mariah Milling received call from pt's PCP, Dr. Burnadette Pop. Pt is continuing to have stuttering chest pain.  She is sched to see Dr. Antoine Poche 12/15/15. Attempted to get her in sooner, but there are no openings.  Dr. Burnadette Pop is sending in Imdur 30 mg for pt.  I am leaving samples of Ranexa 500 mg at the front desk for pt to pick up.  Spoke w/ pt.  She reports that she was already advised of these recommendations and is en route to our office to p/u samples. Advised her to take Ranexa 500 mg BID x 1 week, then 1000 mg BID twice daily x 1 week, then call our office. Advised her that we do not have samples of the 1000 mg, so she will need to take 2 of the 500 mg tabs. She is appreciative and does request that Dr. Mariah Milling see if he can speak w/ Dr. Antoine Poche about getting her a sooner appt.

## 2015-11-24 ENCOUNTER — Telehealth: Payer: Self-pay | Admitting: Cardiovascular Disease

## 2015-11-24 NOTE — Telephone Encounter (Signed)
Patient calling the office for samples of medication:   1.  What medication and dosage are you requesting samples for? renexa   2.  Are you currently out of this medication?   Has three days worth left.   Please call

## 2015-11-24 NOTE — Telephone Encounter (Signed)
Unclear if sx from cough and bronchitis, pulled muscles? Or angina (recent cath with small vessel disease, no intervention available) Or severe HTN as seen on recent admission  We need BP measurements Could up-titrate imdur, If musculoskeletal, would defer to Dr. Elbert Ewings, (PMD) may need pain meds If still coughing, needs appt with pulmonary

## 2015-11-24 NOTE — Telephone Encounter (Signed)
Dr. Mariah Milling received call from pt's PCP, Dr. Burnadette Pop. Pt is continuing to have stuttering chest pain.  She is sched to see Dr. Antoine Poche 12/15/15. Attempted to get her in sooner, but there are no openings.  Dr. Burnadette Pop is sending in Imdur 30 mg for pt.  I am leaving samples of Ranexa 500 mg at the front desk for pt to pick up.  Spoke w/ pt.  She reports that she was already advised of these recommendations and is en route to our office to p/u samples. Advised her to take Ranexa 500 mg BID x 1 week, then 1000 mg BID twice daily x 1 week, then call our office. Advised her that we do not have samples of the 1000 mg, so she will need to take 2 of the 500 mg tabs. She is appreciative and does request that Dr. Mariah Milling see if he can speak w/ Dr. Antoine Poche about getting her a sooner appt.   Tiphani is requesting samples for Ranexa. The patient has a follow up on Sept. 21, 2017 with Ward Givens, NP in [place]. The patient only has three more days left of the Ranexa and will be out of the area tomorrow morning x 3 days.  Spoke with Claris Che and doesn't see a difference in chest pain since started the Ranexa.  Please see note above regarding Ranexa samples. Ranexa has not been placed on medication list yet and I see from the note above, Mandi told the patient to call our office once she has finished the Ranexa samples.  Please advise what the next step is.

## 2015-11-25 NOTE — Telephone Encounter (Signed)
Attempted to contact pt.  No answer, voicemail box not set up yet.    

## 2015-11-26 NOTE — Telephone Encounter (Signed)
Attempted to contact pt.  No answer, voicemail box not set up.

## 2015-11-27 NOTE — Telephone Encounter (Signed)
Attempted to contact pt.  No answer, no voicemail box set up.  Letter mailed.

## 2015-12-03 ENCOUNTER — Encounter: Payer: Self-pay | Admitting: Nurse Practitioner

## 2015-12-03 ENCOUNTER — Encounter: Payer: Self-pay | Admitting: *Deleted

## 2015-12-03 ENCOUNTER — Ambulatory Visit (INDEPENDENT_AMBULATORY_CARE_PROVIDER_SITE_OTHER): Payer: Medicare HMO | Admitting: Nurse Practitioner

## 2015-12-03 ENCOUNTER — Encounter: Payer: Self-pay | Admitting: Cardiology

## 2015-12-03 VITALS — BP 149/76 | HR 76 | Ht 66.0 in | Wt 213.4 lb

## 2015-12-03 DIAGNOSIS — Z006 Encounter for examination for normal comparison and control in clinical research program: Secondary | ICD-10-CM

## 2015-12-03 DIAGNOSIS — I119 Hypertensive heart disease without heart failure: Secondary | ICD-10-CM | POA: Diagnosis not present

## 2015-12-03 DIAGNOSIS — I214 Non-ST elevation (NSTEMI) myocardial infarction: Secondary | ICD-10-CM | POA: Diagnosis not present

## 2015-12-03 DIAGNOSIS — I25119 Atherosclerotic heart disease of native coronary artery with unspecified angina pectoris: Secondary | ICD-10-CM | POA: Diagnosis not present

## 2015-12-03 DIAGNOSIS — E785 Hyperlipidemia, unspecified: Secondary | ICD-10-CM | POA: Diagnosis not present

## 2015-12-03 MED ORDER — RANOLAZINE ER 1000 MG PO TB12: 1000.0000 mg | ORAL_TABLET | Freq: Two times a day (BID) | ORAL | 11 refills | Status: DC

## 2015-12-03 MED ORDER — ISOSORBIDE MONONITRATE ER 60 MG PO TB24: 60.0000 mg | ORAL_TABLET | Freq: Every day | ORAL | 3 refills | Status: DC

## 2015-12-03 NOTE — Progress Notes (Signed)
I met with patient today for Clear Research Study as screening patient.   All elements of the informed consent form,study requirements and expectations were reviewed with the subject. All questions and concerns were identified and addressed prior to signing of the consent. No procedures were performed prior to consenting the subject. The subject was given an adequate amount of time to make an informed decision. A copy of the consent was provided to the patient to take home.   I took first set of vitals and patient had B/P197/52 at 9:20. Patient stated had chest pressure. She took a nitro sublingually and B/P 124/55 at 9:25. Chest pressure improved. Labs were drawn for study and will call patient  to let her know if she qualifies .

## 2015-12-03 NOTE — Progress Notes (Signed)
Office Visit    Patient Name: [name] Date of Encounter: 12/03/2015  Primary Care Provider:  [name], MD Primary Cardiologist:  J. Hochrein, MD   Chief Complaint    70 year old female with a history of CAD status post CABG along with hypertension, hyperlipidemia, diastolic dysfunction, diabetes, and stable carotid disease, who was recently admitted to [facility] regional with chest pain and non-STEMI and found to have stable anatomy on catheterization.  Past Medical History    Past Medical History:  Diagnosis Date  . Arthritis    "hands, back" (12/09/2013)  . Aspirin allergy   . CAD (coronary artery disease)    a. 03/2010 s/p CABG x 3 (LIMA->LAD, VG->OM, VG->Diag);  b. 9/15 PCI native LCX (2.75x16 & 2.75x8 Promus DES');  c. 02/2014 Cath: LM nl, LAD 80-90/100p, LCX patent stents, OM1 100, OM2 99ost, RCA 100p, VG->Diag 100, VG->OM nl, LIMA->LAD nl, EF 55-65%-->Med Rx; d. 10/2015 NSTEMI/Cath: stable anatomy, patent LCX stent, 2/3 patent grafts as prev noted, EF 45%-->Med Rx w/ nitrate/ranexa.  . Carotid arterial disease (HCC)    a. 08/2015 U/S: RICA 40-59%, LICA 1-39%-->f/u 1 yr.  . Chronic stable angina (HCC)   . Diastolic dysfunction    a. 01/2014 Echo: EF 55%, Gr1 DD, triv MR.  . DVT (deep venous thrombosis) (HCC) 1970's   LLE  . HLD (hyperlipidemia)    statin intolerant  . Hypertensive heart disease   . Obesity   . Orthostatic hypotension    previous Midodrine therapy  . Pneumonia 2013?  Marland Kitchen PVD (peripheral vascular disease) (HCC)    a. prior balloon PTA to left leg in [place].  . Type II diabetes mellitus (HCC)    Past Surgical History:  Procedure Laterality Date  . ANGIOPLASTY / STENTING FEMORAL Left 03/14/2011  . APPENDECTOMY    . CARDIAC CATHETERIZATION  03/2010; 2013  . CARDIAC CATHETERIZATION N/A 11/05/2015   Procedure: LEFT HEART CATH AND CORS/GRAFTS ANGIOGRAPHY;  Surgeon: Antonieta Iba, MD;  Location: [facility] INVASIVE CV LAB;  Service:  Cardiovascular;  Laterality: N/A;  . Carotid dopplers  03/2010   40-59% bilateral ICA stenosis  . CATARACT EXTRACTION W/ INTRAOCULAR LENS  IMPLANT, BILATERAL Bilateral   . CHOLECYSTECTOMY  2012  . CORONARY ANGIOPLASTY WITH STENT PLACEMENT  12/09/2013   circumflex was stented 2.75 x 16 overlapping 2.75 x 8 promus drug-eluting stents  . CORONARY ARTERY BYPASS GRAFT  03/2010   CABG X3  . LEFT HEART CATHETERIZATION WITH CORONARY/GRAFT ANGIOGRAM N/A 11/21/2011   Procedure: LEFT HEART CATHETERIZATION WITH Isabel Caprice;  Surgeon: Kathleene Hazel, MD;  Location[facility] C[facility] LAB;  Service: Cardiovascular;  Laterality: N/A;  . LEFT HEART CATHETERIZATION WITH CORONARY/GRAFT ANGIOGRAM N/A 12/09/2013   Procedure: LEFT HEART CATHETERIZATION WITH Isabel Caprice;  Surgeon: Corky Crafts, MD;  Location: [facility] CATH LAB;  Service: Cardiovascular;  Laterality: N/A;  . LEFT HEART CATHETERIZATION WITH CORONARY/GRAFT ANGIOGRAM N/A 03/13/2014   Procedure: LEFT HEART CATHETERIZATION WITH Isabel Caprice;  Surgeon: Peter M Swaziland, MD;  Location: [facility] CATH LAB;  Service: Cardiovascular;  Laterality: N/A;  . PERCUTANEOUS CORONARY STENT INTERVENTION (PCI-S)  12/09/2013   Procedure: PERCUTANEOUS CORONARY STENT INTERVENTION (PCI-S);  Surgeon: Corky Crafts, MD;  Location: [facility] CATH LAB;  Service: Cardiovascular;;  . REFRACTIVE SURGERY     laser  . RIGHT HEART CATHETERIZATION  11/21/2011   Procedure: RIGHT HEART CATH;  Surgeon: Kathleene Hazel, MD;  Location: [facility] CATH LAB;  Service: Cardiovascular;;  . TONSILLECTOMY    .  TUBAL LIGATION Bilateral   . VITRECTOMY Right 2015    Allergies  Allergies  Allergen Reactions  . Bee Venom Anaphylaxis  . Statins Other (See Comments)    Crippling  . Lisinopril Other (See Comments)    Fluctuations BP  . Tylenol [Acetaminophen]   . Aspirin Hives  . Atorvastatin Other (See Comments)    Arthralgias  . Codeine Hives and Nausea Only  .  Cortisone Hives  . Demerol Hives  . Meperidine Hives and Nausea And Vomiting  . Meperidine Hcl Hives  . Metformin Other (See Comments)    Intolerance  . Prednisone Hives    History of Present Illness    70 year old female with the above complex past medical history. She was recently admitted to [facility] regional in late August with chest pain and had mild troponin elevation. Diagnostic catheterization was performed and revealed stable anatomy with known occlusion of the vein graft to the diagonal with patent stent in the native left circumflex and patent vein graft to the obtuse marginal and LIMA to the LAD. Medical therapy was recommended and she was placed on isosorbide mononitrate 30 mg daily along with Ranexa 500 mg twice a day. Her Ranexa has since been increased to thousand milligrams twice a day as recommended by Dr. Mariah Milling while she was in the hospital. Since hospitalization, she has continued to have intermittent chest discomfort. She does take several nitroglycerin when this occurs, and symptoms resolved fairly quickly. She has not been having significant dyspnea on exertion. She had a cough during her hospitalization and was treated with a five-day course of azithromycin. She has continued to have nonproductive cough though has been afebrile and without chills. She denies PND, orthopnea, dizziness, syncope, edema, or early satiety.  Home Medications    Prior to Admission medications   Medication Sig Start Date End Date Taking? Authorizing Provider  clopidogrel (PLAVIX) 75 MG tablet Take 1 tablet (75 mg total) by mouth daily. 11/06/15  Yes Delfino Lovett, MD  furosemide (LASIX) 20 MG tablet Take 1 tablet (20 mg total) by mouth daily as needed for fluid. 05/02/14  Yes Rollene Rotunda, MD  insulin NPH-regular Human (NOVOLIN 70/30) (70-30) 100 UNIT/ML injection Inject 30 Units into the skin 2 (two) times daily with a meal. 11/06/15  Yes Delfino Lovett, MD  metoprolol tartrate (LOPRESSOR) 25 MG tablet  Take 0.5 tablets (12.5 mg total) by mouth 2 (two) times daily. 11/06/15  Yes Vipul Sherryll Burger, MD  NITROSTAT 0.4 MG SL tablet PLACE 1 TABLET UNDER TONGUE EVERY 5 MIN AS NEEDED FOR CHEST PAIN IF NO RELIEF IN15 MIN CALL 911 (MAX 3 TABS) 06/18/14  Yes Rollene Rotunda, MD  ondansetron (ZOFRAN) 4 MG tablet Take 1 tablet (4 mg total) by mouth every 8 (eight) hours as needed for nausea or vomiting. 11/06/15  Yes Delfino Lovett, MD  Polyethyl Glycol-Propyl Glycol (SYSTANE OP) Place 1 drop into both eyes every morning.   Yes Historical Provider, MD  isosorbide mononitrate (IMDUR) 60 MG 24 hr tablet Take 1 tablet (60 mg total) by mouth daily. 12/03/15 03/02/16  Ok Anis, NP  ranolazine (RANEXA) 1000 MG SR tablet Take 1 tablet (1,000 mg total) by mouth 2 (two) times daily. 12/03/15   Ok Anis, NP    Review of Systems    As above, she does express exertional chest discomfort which is nitrate responsive. She continues to have a nonproductive cough which has been present for several weeks. She has completed a course of Zithromax. She  denies dyspnea, PND, orthopnea, dizziness, syncope, edema, or early satiety. All other systems reviewed and are otherwise negative except as noted above.  Physical Exam    VS:  BP (!) 149/76   Pulse 76   Ht 5\' 6"  (1.676 m)   Wt 213 lb 6.4 oz (96.8 kg)   BMI 34.44 kg/m  , BMI Body mass index is 34.44 kg/m. GEN: Well nourished, well developed, in no acute distress.  HEENT: normal.  Neck: Supple, no JVDLeft greater than right carotid bruits, no masses. Cardiac: RRR, no murmurs, rubs, or gallops. No clubbing, cyanosis, edema.  Radials/DP/PT 2+ and equal bilaterally.  Respiratory:  Respirations regular and unlabored, clear to auscultation bilaterally. GI: Soft, nontender, nondistended, BS + x 4. MS: no deformity or atrophy. Skin: warm and dry, no rash. Neuro:  Strength and sensation are intact. Psych: Normal affect.  Accessory Clinical Findings    ECG - Regular sinus  rhythm, PVCs, no acute ST or T changes.  Assessment & Plan    1.  Non-ST segment elevation myocardial infarction, subsequent episode of care/CAD: Status post recent admission at [facility] with chest pain and mild troponin elevation. Catheterization revealed stable native three-vessel disease with a patent stent in the left circumflex. She has 2 of 3 patent grafts with a known occlusion of the vein graft to the diagonal. She has been medically managed with isosorbide and Ranexa. She is continued to have intermittent chest discomfort and has been using sublingual nitroglycerin. Her ECG is stable today. I will increase her isosorbide to 60 mg daily. She has run out of her Ranexa, as she had only been given samples previously. I will refill this at 1000 mg twice a day, which is the dose she was last taking. Otherwise, she remains on Plavix and beta blocker therapy. She is allergic to aspirin and previously intolerant to statins. She is looking into enrollment into a lipid trial run [facility].  2. Hypertensive heart disease: Blood pressure is mildly elevated today at 149/76. She says this is actually reasonably good for her and also notes that her blood pressure can be all over the place. She is on low-dose metoprolol as above, I'm titrating her nitrate further. I recommended she continue to follow her blood pressure as spikes may contribute to microvascular angina.  3. Hyperlipidemia: She is intolerant to statins. She has been [facility] for consideration in a lipid trial.  4. Bilateral carotid arterial disease: This was stable but ultrasound June 2017 with recommendation for follow-up in one year.  5. Type 2 diabetes mellitus: She is on insulin and this is followed by primary care.  6. Morbid obesity: She would benefit from cardiac rehabilitation though continues to work and does not plan to enroll.  7. Disposition: Follow-up with Dr. Antoine PocheHochrein in 3  months.  Nicolasa Duckinghristopher Amylynn Fano, NP 12/03/2015, 11:09 AM

## 2015-12-03 NOTE — Patient Instructions (Addendum)
Medication Instructions: The isosorbide has been increased from 30 mg daily to 60 mg daily. A new prescription for 60 mg tablets has been sent into your pharmacy. You can take (2) of the 30 mg tablets once daily until completed then start the 60 mg tablet prescription.  There has also been a prescription sent to the pharmacy for generic Ranexa 1000 mg.    Labwork:  none  Testing/Procedures:  none  Follow-Up:  3 months with Dr Antoine Poche.  Any Other Special Instructions Will Be Listed Below (If Applicable).

## 2015-12-09 NOTE — Telephone Encounter (Signed)
No answer. No voicemail. 

## 2015-12-09 NOTE — Telephone Encounter (Signed)
Patient received letter and would like to go over these results.  Please call back.

## 2015-12-10 ENCOUNTER — Encounter: Payer: Self-pay | Admitting: Cardiology

## 2015-12-14 ENCOUNTER — Ambulatory Visit: Payer: Medicare HMO | Admitting: Cardiology

## 2016-01-01 ENCOUNTER — Telehealth: Payer: Self-pay | Admitting: Cardiology

## 2016-01-01 MED ORDER — METOPROLOL TARTRATE 25 MG PO TABS: 25.0000 mg | ORAL_TABLET | Freq: Two times a day (BID) | ORAL | 0 refills | Status: DC

## 2016-01-01 NOTE — Telephone Encounter (Signed)
Spoke with pt, her bp was 240/110 was at 1 am when she woke not feeling well. She took 3 NTG. The 194/110 was prior to taking her medications this morning. Repeat bp later in the day was 150/96 and 140/96. Her medications have been confirmed. Discussed with dr jordan(DOD), patient instructed to increase metoprolol to 25 mg twice daily. She will continue to track her blood pressure and let us know how things are going.

## 2016-01-01 NOTE — Telephone Encounter (Signed)
[name] is calling because her blood pressure is up.   12/30/15 her bp was 240/100 and this morning it was 194/110 and she has been living on [place]. States she is struggling with keep her blood pressure down. Please call    Thanks

## 2016-01-14 ENCOUNTER — Encounter: Payer: Self-pay | Admitting: *Deleted

## 2016-01-14 DIAGNOSIS — Z006 Encounter for examination for normal comparison and control in clinical research program: Secondary | ICD-10-CM

## 2016-01-14 NOTE — Progress Notes (Signed)
[facility] T1-W0 visit for the CLEAR research study.  No complaints.  Subject was issued new bottle of study medication.

## 2016-03-03 ENCOUNTER — Ambulatory Visit: Payer: Medicare HMO | Admitting: Cardiology

## 2016-03-04 DIAGNOSIS — I219 Acute myocardial infarction, unspecified: Secondary | ICD-10-CM

## 2016-03-04 HISTORY — DX: Acute myocardial infarction, unspecified: I21.9

## 2016-03-17 NOTE — Progress Notes (Signed)
HPI The patient presents for followup of coronary disease.  She has an extensive history of CAD.  She has been intolerant of many medications.  She took herself off of most meds. She was admitted to [facility] in late August with chest pain and had mild troponin elevation. Diagnostic catheterization was performed and revealed stable anatomy with known occlusion of the vein graft to the diagonal with patent stent in the native left circumflex and patent vein graft to the obtuse marginal and LIMA to the LAD. Medical therapy was recommended and she was placed on isosorbide mononitrate 30 mg daily along with Ranexa 500 mg twice a day. Her Ranexa has since been increased to thousand milligrams twice a day as recommended by Dr. Mariah Milling.  As she has done in the past she stopped her meds because of diarrhea.  She says that she feels much better off of these meds.  She is rarely getting chest pain and takes about 1 NTG every eight days.  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     Allergies  Allergen Reactions  . Bee Venom Anaphylaxis  . Statins Other (See Comments)    Crippling  . Lisinopril Other (See Comments)    Fluctuations BP  . Tylenol [Acetaminophen]   . Aspirin Hives  . Atorvastatin Other (See Comments)    Arthralgias  . Codeine Hives and Nausea Only  . Cortisone Hives  . Demerol Hives  . Meperidine Hives and Nausea And Vomiting  . Meperidine Hcl Hives  . Metformin Other (See Comments)    Intolerance  . Prednisone Hives    Current Outpatient Prescriptions  Medication Sig Dispense Refill  . furosemide (LASIX) 20 MG tablet Take 1 tablet (20 mg total) by mouth daily as needed for fluid. 30 tablet 9  . insulin NPH-regular Human (NOVOLIN 70/30) (70-30) 100 UNIT/ML injection Inject 30 Units into the skin 2 (two) times daily with a meal. (Patient taking differently: Inject 20 Units into the skin 2 (two) times daily with a meal. ) 10 mL 11  . NITROSTAT  0.4 MG SL tablet PLACE 1 TABLET UNDER TONGUE EVERY 5 MIN AS NEEDED FOR CHEST PAIN IF NO RELIEF IN15 MIN CALL 911 (MAX 3 TABS) 100 tablet 0  . NON FORMULARY Study Drug: Bempedoic Acid 180mg  Tke 1 tablet once a day    . Polyethyl Glycol-Propyl Glycol (SYSTANE OP) Place 1 drop into both eyes every morning.     No current facility-administered medications for this visit.     Past Medical History:  Diagnosis Date  . Arthritis    "hands, back" (12/09/2013)  . Aspirin allergy   . CAD (coronary artery disease)    a. 03/2010 s/p CABG x 3 (LIMA->LAD, VG->OM, VG->Diag);  b. 9/15 PCI native LCX (2.75x16 & 2.75x8 Promus DES');  c. 02/2014 Cath: LM nl, LAD 80-90/100p, LCX patent stents, OM1 100, OM2 99ost, RCA 100p, VG->Diag 100, VG->OM nl, LIMA->LAD nl, EF 55-65%-->Med Rx; d. 10/2015 NSTEMI/Cath: stable anatomy, patent LCX stent, 2/3 patent grafts as prev noted, EF 45%-->Med Rx w/ nitrate/ranexa.  . Carotid arterial disease (HCC)    a. 08/2015 U/S: RICA 40-59%, LICA 1-39%-->f/u 1 yr.  . Chronic stable angina (HCC)   . Diastolic dysfunction    a. 01/2014 Echo: EF 55%, Gr1 DD, triv MR.  . DVT (deep venous thrombosis) (HCC) 1970's   LLE  . HLD (hyperlipidemia)    statin intolerant  . Hypertensive heart disease   .  Obesity   . Orthostatic hypotension    previous Midodrine therapy  . Pneumonia 2013?  Marland Kitchen PVD (peripheral vascular disease) (HCC)    a. prior balloon PTA to left leg in [place].  . Type II diabetes mellitus (HCC)     Past Surgical History:  Procedure Laterality Date  . ANGIOPLASTY / STENTING FEMORAL Left 03/14/2011  . APPENDECTOMY    . CARDIAC CATHETERIZATION  03/2010; 2013  . CARDIAC CATHETERIZATION N/A 11/05/2015   Procedure: LEFT HEART CATH AND CORS/GRAFTS ANGIOGRAPHY;  Surgeon: Antonieta Iba, MD;  Location: [facility] INVASIVE CV LAB;  Service: Cardiovascular;  Laterality: N/A;  . Carotid dopplers  03/2010   40-59% bilateral ICA stenosis  . CATARACT EXTRACTION W/ INTRAOCULAR LENS   IMPLANT, BILATERAL Bilateral   . CHOLECYSTECTOMY  2012  . CORONARY ANGIOPLASTY WITH STENT PLACEMENT  12/09/2013   circumflex was stented 2.75 x 16 overlapping 2.75 x 8 promus drug-eluting stents  . CORONARY ARTERY BYPASS GRAFT  03/2010   CABG X3  . LEFT HEART CATHETERIZATION WITH CORONARY/GRAFT ANGIOGRAM N/A 11/21/2011   Procedure: LEFT HEART CATHETERIZATION WITH Isabel Caprice;  Surgeon: Kathleene Hazel, MD;  Location: [facility] CATH LAB;  Service: Cardiovascular;  Laterality: N/A;  . LEFT HEART CATHETERIZATION WITH CORONARY/GRAFT ANGIOGRAM N/A 12/09/2013   Procedure: LEFT HEART CATHETERIZATION WITH Isabel Caprice;  Surgeon: Corky Crafts, MD;  Location: [facility] CATH LAB;  Service: Cardiovascular;  Laterality: N/A;  . LEFT HEART CATHETERIZATION WITH CORONARY/GRAFT ANGIOGRAM N/A 03/13/2014   Procedure: LEFT HEART CATHETERIZATION WITH Isabel Caprice;  Surgeon: Peter M Swaziland, MD;  Location: [facility] CATH LAB;  Service: Cardiovascular;  Laterality: N/A;  . PERCUTANEOUS CORONARY STENT INTERVENTION (PCI-S)  12/09/2013   Procedure: PERCUTANEOUS CORONARY STENT INTERVENTION (PCI-S);  Surgeon: Corky Crafts, MD;  Location: [facility] CATH LAB;  Service: Cardiovascular;;  . REFRACTIVE SURGERY     laser  . RIGHT HEART CATHETERIZATION  11/21/2011   Procedure: RIGHT HEART CATH;  Surgeon: Kathleene Hazel, MD;  Location: [facility] CATH LAB;  Service: Cardiovascular;;  . TONSILLECTOMY    . TUBAL LIGATION Bilateral   . VITRECTOMY Right 2015    ROS:  As stated in the HPI and negative for all other systems.  PHYSICAL EXAM BP (!) 154/81 (BP Location: Left Arm, Patient Position: Sitting, Cuff Size: Large)   Pulse 82   Ht 5' 5.5" (1.664 m)   Wt 212 lb (96.2 kg)   BMI 34.74 kg/m  GENERAL:  Well appearing NECK:  No jugular venous distention, waveform within normal limits, carotid upstroke brisk and symmetric, no bruits, no thyromegaly LUNGS:  Clear to auscultation bilaterally CHEST:  Well  healed sternotomy scar. HEART:  PMI not displaced or sustained,S1 and S2 within normal limits, no S3, no S4, no clicks, no rubs, no murmurs ABD:  Flat, positive bowel sounds normal in frequency in pitch, no bruits, no rebound, no guarding, no midline pulsatile mass, no hepatomegaly, no splenomegaly EXT:  2 plus pulses throughout, mild diffuse edema in hands and feet, no cyanosis no clubbing   ASSESSMENT AND PLAN  CAD - She is having rare angina.  No change in therapy is planned.   She does not tolerate medications.  She does agree to restart Plavxi 75 mg.    CAROTID ARTERY STENOSIS, BILATERAL -  She has 40 - 59% right.  She is due for follow-up of this in June.  HYPERLIPIDEMIA -  She has been intolerant of statins (Pravachol, Lipitor, Crestor and Red Yeast Rice).  She was  unable to take Zetia.  She could not afford PCSK9 inibitor.   HTN - Her BP is okay at home.Marland Kitchen  No change in therapy is indicated.

## 2016-03-18 ENCOUNTER — Encounter: Payer: Self-pay | Admitting: Cardiology

## 2016-03-18 ENCOUNTER — Ambulatory Visit (INDEPENDENT_AMBULATORY_CARE_PROVIDER_SITE_OTHER): Payer: Medicare HMO | Admitting: Cardiology

## 2016-03-18 VITALS — BP 154/81 | HR 82 | Ht 65.5 in | Wt 212.0 lb

## 2016-03-18 DIAGNOSIS — I25118 Atherosclerotic heart disease of native coronary artery with other forms of angina pectoris: Secondary | ICD-10-CM

## 2016-03-18 NOTE — Patient Instructions (Addendum)
Medication Instructions:  RESTART- Plavix  Labwork: None Ordered  Testing/Procedures: None Ordered  Follow-Up: Your physician wants you to follow-up in: 6 Months. You will receive a reminder letter in the mail two months in advance. If you don't receive a letter, please call our office to schedule the follow-up appointment.   Any Other Special Instructions Will Be Listed Below (If Applicable).     If you need a refill on your cardiac medications before your next appointment, please call your pharmacy.

## 2016-03-20 ENCOUNTER — Encounter: Payer: Self-pay | Admitting: Cardiology

## 2016-07-11 ENCOUNTER — Encounter: Payer: Self-pay | Admitting: *Deleted

## 2016-07-11 DIAGNOSIS — Z006 Encounter for examination for normal comparison and control in clinical research program: Secondary | ICD-10-CM

## 2016-07-11 NOTE — Progress Notes (Signed)
Subject into [facility] clinic for T4-M6 visit.  No complaints or adverse events to report.  Dispensed 2 bottles of study medication since will not be returning to clinic for 6 months.

## 2016-08-15 ENCOUNTER — Other Ambulatory Visit: Payer: Self-pay | Admitting: Cardiology

## 2016-08-15 MED ORDER — FUROSEMIDE 20 MG PO TABS: 20.0000 mg | ORAL_TABLET | Freq: Every day | ORAL | 2 refills | Status: DC | PRN

## 2016-08-15 NOTE — Telephone Encounter (Signed)
New message        *STAT* If patient is at the pharmacy, call can be transferred to refill team.   1. Which medications need to be refilled? (please list name of each medication and dose if known) lasix 20mg  2. Which pharmacy/location (including street and city if local pharmacy) is medication to be sent to? [facility] [place] 3. Do they need a 30 day or 90 day supply? 30 day

## 2016-08-15 NOTE — Telephone Encounter (Signed)
Rx(s) sent to pharmacy electronically.  

## 2016-09-01 ENCOUNTER — Encounter (HOSPITAL_COMMUNITY): Payer: Medicare HMO

## 2016-10-21 ENCOUNTER — Telehealth: Payer: Self-pay | Admitting: *Deleted

## 2016-10-21 NOTE — Telephone Encounter (Signed)
Spoke with subject for visit T5-M9 for the Clear Research Study.  She said that around October 11, 2016 she started experiencing hand and knee pain.  She has not taken any drug since and is now almost pain free.  She wants to continue on a drug holiday until November 16, 2016.  She doesn't want to re-challenge until she returns from [place] to [place]. She will then re-challenge the study drug.

## 2016-10-24 ENCOUNTER — Ambulatory Visit (HOSPITAL_COMMUNITY)
Admission: RE | Admit: 2016-10-24 | Discharge: 2016-10-24 | Disposition: A | Discharge status: Home / Self Care | Payer: Medicare HMO | Source: Ambulatory Visit | Attending: Cardiovascular Disease | Admitting: Cardiovascular Disease

## 2016-10-24 DIAGNOSIS — I6523 Occlusion and stenosis of bilateral carotid arteries: Secondary | ICD-10-CM

## 2016-10-30 NOTE — Progress Notes (Signed)
HPI The patient presents for followup of coronary disease.  She has an extensive history of CAD.  She has been intolerant of many medications.  She took herself off of most meds.  Since I last saw her she says that she is doing well.  She says that she rarely takes NTG.  She is very active.  The patient denies any new symptoms such as  neck or arm discomfort. There has been no new shortness of breath, PND or orthopnea. There have been no reported palpitations, presyncope or syncope.   Allergies  Allergen Reactions  . Bee Venom Anaphylaxis  . Statins Other (See Comments)    Crippling  . Lisinopril Other (See Comments)    Fluctuations BP  . Tylenol [Acetaminophen]   . Aspirin Hives  . Atorvastatin Other (See Comments)    Arthralgias  . Codeine Hives and Nausea Only  . Cortisone Hives  . Demerol Hives  . Meperidine Hives and Nausea And Vomiting  . Meperidine Hcl Hives  . Metformin Other (See Comments)    Intolerance  . Prednisone Hives    Current Outpatient Prescriptions  Medication Sig Dispense Refill  . furosemide (LASIX) 20 MG tablet Take 1 tablet (20 mg total) by mouth daily as needed for fluid. 30 tablet 2  . insulin NPH-regular Human (NOVOLIN 70/30) (70-30) 100 UNIT/ML injection Inject 30 Units into the skin 2 (two) times daily with a meal. (Patient taking differently: Inject 20 Units into the skin 2 (two) times daily with a meal. ) 10 mL 11  . NITROSTAT 0.4 MG SL tablet PLACE 1 TABLET UNDER TONGUE EVERY 5 MIN AS NEEDED FOR CHEST PAIN IF NO RELIEF IN15 MIN CALL 911 (MAX 3 TABS) 100 tablet 0  . NON FORMULARY Study Drug: Bempedoic Acid 180mg  Tke 1 tablet once a day    . Polyethyl Glycol-Propyl Glycol (SYSTANE OP) Place 1 drop into both eyes every morning.     No current facility-administered medications for this visit.     Past Medical History:  Diagnosis Date  . Arthritis    "hands, back" (12/09/2013)  . Aspirin allergy   . CAD (coronary artery disease)    a.  03/2010 s/p CABG x 3 (LIMA->LAD, VG->OM, VG->Diag);  b. 9/15 PCI native LCX (2.75x16 & 2.75x8 Promus DES');  c. 02/2014 Cath: LM nl, LAD 80-90/100p, LCX patent stents, OM1 100, OM2 99ost, RCA 100p, VG->Diag 100, VG->OM nl, LIMA->LAD nl, EF 55-65%-->Med Rx; d. 10/2015 NSTEMI/Cath: stable anatomy, patent LCX stent, 2/3 patent grafts as prev noted, EF 45%-->Med Rx w/ nitrate/ranexa.  . Carotid arterial disease (HCC)    a. 08/2015 U/S: RICA 40-59%, LICA 1-39%-->f/u 1 yr.  . Chronic stable angina (HCC)   . Diastolic dysfunction    a. 01/2014 Echo: EF 55%, Gr1 DD, triv MR.  . DVT (deep venous thrombosis) (HCC) 1970's   LLE  . HLD (hyperlipidemia)    statin intolerant  . Hypertensive heart disease   . Obesity   . Orthostatic hypotension    previous Midodrine therapy  . Pneumonia 2013?  Marland Kitchen PVD (peripheral vascular disease) (HCC)    a. prior balloon PTA to left leg in [place].  . Type II diabetes mellitus (HCC)     Past Surgical History:  Procedure Laterality Date  . ANGIOPLASTY / STENTING FEMORAL Left 03/14/2011  . APPENDECTOMY    . CARDIAC CATHETERIZATION  03/2010; 2013  . CARDIAC CATHETERIZATION N/A 11/05/2015   Procedure: LEFT HEART CATH AND CORS/GRAFTS ANGIOGRAPHY;  Surgeon: Antonieta Iba, MD;  Location: [facility] INVASIVE CV LAB;  Service: Cardiovascular;  Laterality: N/A;  . Carotid dopplers  03/2010   40-59% bilateral ICA stenosis  . CATARACT EXTRACTION W/ INTRAOCULAR LENS  IMPLANT, BILATERAL Bilateral   . CHOLECYSTECTOMY  2012  . CORONARY ANGIOPLASTY WITH STENT PLACEMENT  12/09/2013   circumflex was stented 2.75 x 16 overlapping 2.75 x 8 promus drug-eluting stents  . CORONARY ARTERY BYPASS GRAFT  03/2010   CABG X3  . LEFT HEART CATHETERIZATION WITH CORONARY/GRAFT ANGIOGRAM N/A 11/21/2011   Procedure: LEFT HEART CATHETERIZATION WITH Isabel Caprice;  Surgeon: Kathleene Hazel, MD;  Location: [facility] CATH LAB;  Service: Cardiovascular;  Laterality: N/A;  . LEFT HEART  CATHETERIZATION WITH CORONARY/GRAFT ANGIOGRAM N/A 12/09/2013   Procedure: LEFT HEART CATHETERIZATION WITH Isabel Caprice;  Surgeon: Corky Crafts, MD;  Location: [facility] CATH LAB;  Service: Cardiovascular;  Laterality: N/A;  . LEFT HEART CATHETERIZATION WITH CORONARY/GRAFT ANGIOGRAM N/A 03/13/2014   Procedure: LEFT HEART CATHETERIZATION WITH Isabel Caprice;  Surgeon: Peter M Swaziland, MD;  Location: [facility] CATH LAB;  Service: Cardiovascular;  Laterality: N/A;  . PERCUTANEOUS CORONARY STENT INTERVENTION (PCI-S)  12/09/2013   Procedure: PERCUTANEOUS CORONARY STENT INTERVENTION (PCI-S);  Surgeon: Corky Crafts, MD;  Location: [facility] CATH LAB;  Service: Cardiovascular;;  . REFRACTIVE SURGERY     laser  . RIGHT HEART CATHETERIZATION  11/21/2011   Procedure: RIGHT HEART CATH;  Surgeon: Kathleene Hazel, MD;  Location: [facility] CATH LAB;  Service: Cardiovascular;;  . TONSILLECTOMY    . TUBAL LIGATION Bilateral   . VITRECTOMY Right 2015    ROS:  As stated in the HPI and negative for all other systems.  PHYSICAL EXAM BP (!) 170/87   Pulse 75   Ht 5' 5.5" (1.664 m)   Wt 217 lb (98.4 kg)   BMI 35.56 kg/m   GENERAL:  Well appearing NECK:  No jugular venous distention, waveform within normal limits, carotid upstroke brisk and symmetric, no bruits, no thyromegaly LUNGS:  Clear to auscultation bilaterally CHEST:  Unremarkable HEART:  PMI not displaced or sustained,S1 and S2 within normal limits, no S3, no S4, no clicks, no rubs, no murmurs ABD:  Flat, positive bowel sounds normal in frequency in pitch, no bruits, no rebound, no guarding, no midline pulsatile mass, no hepatomegaly, no splenomegaly EXT:  2 plus pulses throughout, mild diffuse non pitting edema, no cyanosis no clubbing   ASSESSMENT AND PLAN   CAD - She denies chest pain.  She is intolerant to ASA but refuses to take Plavix or other med.  She really controls her therapy and refuses most meds.    CAROTID ARTERY  STENOSIS, BILATERAL -  Progression of RICA disease with higher velocities compared to prior exam, now in 60-79% range of stenosis. Stable 1-39% LICA stenosis.  Follow up in one year.  This was done earlier this month.   HYPERLIPIDEMIA -  She is on the CLEAR study with bempedoic acid 180mg /day, a non-statin alternative, versus placebo, reduces the risk of major adverse coronary events in patients with, or at high risk for, coronary vascular disease.  She has been intolerant of statins (Pravachol, Lipitor, Crestor and Red Yeast Rice).  She was unable to take Zetia.  She could not afford PCSK9 inibitor.    HTN - Her BP, by her report is good at home.  She does not want further therapy.   DM - She reports that the A1C is 7.4.  She is followed by  endocrinology.

## 2016-10-31 ENCOUNTER — Ambulatory Visit (INDEPENDENT_AMBULATORY_CARE_PROVIDER_SITE_OTHER): Payer: Medicare HMO | Admitting: Cardiology

## 2016-10-31 VITALS — BP 170/87 | HR 75 | Ht 65.5 in | Wt 217.0 lb

## 2016-10-31 DIAGNOSIS — E785 Hyperlipidemia, unspecified: Secondary | ICD-10-CM

## 2016-10-31 DIAGNOSIS — I10 Essential (primary) hypertension: Secondary | ICD-10-CM

## 2016-10-31 DIAGNOSIS — I25118 Atherosclerotic heart disease of native coronary artery with other forms of angina pectoris: Secondary | ICD-10-CM

## 2016-10-31 MED ORDER — FUROSEMIDE 20 MG PO TABS: 20.0000 mg | ORAL_TABLET | Freq: Every day | ORAL | 2 refills | Status: DC | PRN

## 2016-10-31 NOTE — Patient Instructions (Addendum)
Medication Instructions:  Continue current medications  If you need a refill on your cardiac medications before your next appointment, please call your pharmacy.  Labwork: None Ordered   Testing/Procedures: None Ordered  Follow-Up: Your physician wants you to follow-up in: 1 Year. You should receive a reminder letter in the mail two months in advance. If you do not receive a letter, please call our office [phone].    Thank you for choosing [facility]!!      

## 2016-11-01 ENCOUNTER — Encounter: Payer: Self-pay | Admitting: Cardiology

## 2017-01-06 ENCOUNTER — Encounter: Payer: Self-pay | Admitting: *Deleted

## 2017-01-06 DIAGNOSIS — Z006 Encounter for examination for normal comparison and control in clinical research program: Secondary | ICD-10-CM

## 2017-01-06 NOTE — Progress Notes (Signed)
Late entry: Subject to research clinic for T6-M12 visit in the Clear Research Study. No c/o, aes or saes to report. Subject was 90% compliant with her meds and new drug was dispensed.  Follow up phone call and next clinic visit scheduled.  Subject re-consented to: [place] Version 5.1 28Aug2018 Local Version 29Aug2018

## 2017-04-27 ENCOUNTER — Telehealth: Payer: Self-pay | Admitting: *Deleted

## 2017-04-27 NOTE — Telephone Encounter (Signed)
Late entry: Spoke with patient for the T7M15 visit for the Clear Research Study.  No c/o, aes or saes to capture.  Next visit scheduled in clinic for 07/04/17.

## 2017-05-02 ENCOUNTER — Ambulatory Visit (INDEPENDENT_AMBULATORY_CARE_PROVIDER_SITE_OTHER): Payer: Medicare HMO | Admitting: Vascular Surgery

## 2017-05-02 ENCOUNTER — Other Ambulatory Visit (INDEPENDENT_AMBULATORY_CARE_PROVIDER_SITE_OTHER): Payer: Self-pay | Admitting: Vascular Surgery

## 2017-05-02 ENCOUNTER — Ambulatory Visit (INDEPENDENT_AMBULATORY_CARE_PROVIDER_SITE_OTHER): Payer: Medicare HMO

## 2017-05-02 ENCOUNTER — Encounter (INDEPENDENT_AMBULATORY_CARE_PROVIDER_SITE_OTHER): Payer: Self-pay | Admitting: Vascular Surgery

## 2017-05-02 VITALS — BP 180/90 | HR 77 | Resp 17 | Ht 65.5 in | Wt 215.0 lb

## 2017-05-02 DIAGNOSIS — I10 Essential (primary) hypertension: Secondary | ICD-10-CM | POA: Diagnosis not present

## 2017-05-02 DIAGNOSIS — M79605 Pain in left leg: Secondary | ICD-10-CM

## 2017-05-02 DIAGNOSIS — Z9862 Peripheral vascular angioplasty status: Secondary | ICD-10-CM

## 2017-05-02 DIAGNOSIS — I70222 Atherosclerosis of native arteries of extremities with rest pain, left leg: Secondary | ICD-10-CM

## 2017-05-02 DIAGNOSIS — Z794 Long term (current) use of insulin: Secondary | ICD-10-CM | POA: Diagnosis not present

## 2017-05-02 DIAGNOSIS — E1159 Type 2 diabetes mellitus with other circulatory complications: Secondary | ICD-10-CM | POA: Diagnosis not present

## 2017-05-02 DIAGNOSIS — I70229 Atherosclerosis of native arteries of extremities with rest pain, unspecified extremity: Secondary | ICD-10-CM | POA: Insufficient documentation

## 2017-05-02 NOTE — Assessment & Plan Note (Signed)
blood glucose control important in reducing the progression of atherosclerotic disease. Also, involved in wound healing. On appropriate medications.  

## 2017-05-02 NOTE — Assessment & Plan Note (Signed)
Her noninvasive studies today demonstrate a mild drop in her right ABI now down to 0.89 with somewhat hyperemic monophasic waveforms.  Her right digital pressure is 81.  On the left, the drop in perfusion has been much more dramatic.  Her left ABI is 0.51 with monophasic wave forms and nondetectable digital pressures. This represents a critical and limb threatening situation.  The acuity of the drop is also concerning as the pain started somewhat abruptly about a week ago.  She will be taken to the angiography suite in the next 24-48 hours where attempt at revascularization will be performed.  It was discussed with the patient that open surgical therapy may be needed.  The patient voices her understanding of this critical and limb threatening situation.

## 2017-05-02 NOTE — Assessment & Plan Note (Signed)
blood pressure control important in reducing the progression of atherosclerotic disease. On appropriate oral medications.  

## 2017-05-02 NOTE — Progress Notes (Signed)
MRN : [id number]  [name] is a 72 y.o. ([birth year]) female who presents with chief complaint of  Chief Complaint  Patient presents with  . Follow-up    ABI  .  History of Present Illness: Patient is seen today as an urgent add-on.  She was last seen in 2017 and has previously undergone left lower extremity vascularization many years ago.  She presents with left foot pain consistent with rest pain.  She does not have right leg symptoms.  She has some skin changes to her left heel but no open ulcerations.  She denies fever or chills.  There is no clear inciting event or causative factor that started her symptoms. Her noninvasive studies today demonstrate a mild drop in her right ABI now down to 0.89 with somewhat hyperemic monophasic waveforms.  Her right digital pressure is 81.  On the left, the drop in perfusion has been much more dramatic.  Her left ABI is 0.51 with monophasic wave forms and nondetectable digital pressures.  Current Outpatient Medications  Medication Sig Dispense Refill  . insulin NPH-regular Human (NOVOLIN 70/30) (70-30) 100 UNIT/ML injection Inject 30 Units into the skin 2 (two) times daily with a meal. (Patient taking differently: Inject 40 Units into the skin 2 (two) times daily with a meal. ) 10 mL 11  . NITROSTAT 0.4 MG SL tablet PLACE 1 TABLET UNDER TONGUE EVERY 5 MIN AS NEEDED FOR CHEST PAIN IF NO RELIEF IN15 MIN CALL 911 (MAX 3 TABS) (Patient taking differently: PLACE 0.4 MG UNDER TONGUE EVERY 5 MIN AS NEEDED FOR CHEST PAIN IF NO RELIEF IN15 MIN CALL 911 (MAX 3 TABS)) 100 tablet 0  . furosemide (LASIX) 20 MG tablet Take 1 tablet (20 mg total) by mouth daily as needed for fluid. 30 tablet 2   No current facility-administered medications for this visit.     Past Medical History:  Diagnosis Date  . Arthritis    "hands, back" (12/09/2013)  . Aspirin allergy   . CAD (coronary artery disease)    a. 03/2010 s/p CABG x 3 (LIMA->LAD, VG->OM, VG->Diag);  b. 9/15  PCI native LCX (2.75x16 & 2.75x8 Promus DES');  c. 02/2014 Cath: LM nl, LAD 80-90/100p, LCX patent stents, OM1 100, OM2 99ost, RCA 100p, VG->Diag 100, VG->OM nl, LIMA->LAD nl, EF 55-65%-->Med Rx; d. 10/2015 NSTEMI/Cath: stable anatomy, patent LCX stent, 2/3 patent grafts as prev noted, EF 45%-->Med Rx w/ nitrate/ranexa.  . Carotid arterial disease (HCC)    a. 08/2015 U/S: RICA 40-59%, LICA 1-39%-->f/u 1 yr.  . Chronic stable angina (HCC)   . Diastolic dysfunction    a. 01/2014 Echo: EF 55%, Gr1 DD, triv MR.  . DVT (deep venous thrombosis) (HCC) 1970's   LLE  . HLD (hyperlipidemia)    statin intolerant  . Hypertensive heart disease   . Obesity   . Orthostatic hypotension    previous Midodrine therapy  . Pneumonia 2013?  Marland Kitchen PVD (peripheral vascular disease) (HCC)    a. prior balloon PTA to left leg in [place].  . Type II diabetes mellitus (HCC)     Past Surgical History:  Procedure Laterality Date  . ANGIOPLASTY / STENTING FEMORAL Left 03/14/2011  . APPENDECTOMY    . CARDIAC CATHETERIZATION  03/2010; 2013  . CARDIAC CATHETERIZATION N/A 11/05/2015   Procedure: LEFT HEART CATH AND CORS/GRAFTS ANGIOGRAPHY;  Surgeon: Antonieta Iba, MD;  Location: [facility] INVASIVE CV LAB;  Service: Cardiovascular;  Laterality: N/A;  . Carotid dopplers  03/2010  40-59% bilateral ICA stenosis  . CATARACT EXTRACTION W/ INTRAOCULAR LENS  IMPLANT, BILATERAL Bilateral   . CHOLECYSTECTOMY  2012  . CORONARY ANGIOPLASTY WITH STENT PLACEMENT  12/09/2013   circumflex was stented 2.75 x 16 overlapping 2.75 x 8 promus drug-eluting stents  . CORONARY ARTERY BYPASS GRAFT  03/2010   CABG X3  . LEFT HEART CATHETERIZATION WITH CORONARY/GRAFT ANGIOGRAM N/A 11/21/2011   Procedure: LEFT HEART CATHETERIZATION WITH Isabel Caprice;  Surgeon: Kathleene Hazel, MD;  Location: [facility] CATH LAB;  Service: Cardiovascular;  Laterality: N/A;  . LEFT HEART CATHETERIZATION WITH CORONARY/GRAFT ANGIOGRAM N/A 12/09/2013   Procedure:  LEFT HEART CATHETERIZATION WITH Isabel Caprice;  Surgeon: Corky Crafts, MD;  Location: [facility] CATH LAB;  Service: Cardiovascular;  Laterality: N/A;  . LEFT HEART CATHETERIZATION WITH CORONARY/GRAFT ANGIOGRAM N/A 03/13/2014   Procedure: LEFT HEART CATHETERIZATION WITH Isabel Caprice;  Surgeon: Peter M Swaziland, MD;  Location: [facility] CATH LAB;  Service: Cardiovascular;  Laterality: N/A;  . PERCUTANEOUS CORONARY STENT INTERVENTION (PCI-S)  12/09/2013   Procedure: PERCUTANEOUS CORONARY STENT INTERVENTION (PCI-S);  Surgeon: Corky Crafts, MD;  Location: [facility] CATH LAB;  Service: Cardiovascular;;  . REFRACTIVE SURGERY     laser  . RIGHT HEART CATHETERIZATION  11/21/2011   Procedure: RIGHT HEART CATH;  Surgeon: Kathleene Hazel, MD;  Location: [facility] CATH LAB;  Service: Cardiovascular;;  . TONSILLECTOMY    . TUBAL LIGATION Bilateral   . VITRECTOMY Right 2015    Social History Social History   Tobacco Use  . Smoking status: Never Smoker  . Smokeless tobacco: Never Used  Substance Use Topics  . Alcohol use: No  . Drug use: No    Family History Family History  Problem Relation Age of Onset  . Esophageal cancer Mother   . Stroke Mother   . Heart attack Father        MI  . Heart attack Brother        CABG x 4   . Heart disease Brother        valve replacement    Allergies  Allergen Reactions  . Bee Venom Anaphylaxis  . Statins Other (See Comments)    Crippling  . Lisinopril Other (See Comments)    Fluctuations BP  . Tylenol [Acetaminophen] Other (See Comments)    Unknown  . Aspirin Hives  . Atorvastatin Other (See Comments)    Arthralgias  . Codeine Hives and Nausea Only  . Cortisone Hives  . Demerol Hives  . Meperidine Hives and Nausea And Vomiting  . Metformin Other (See Comments)    Intolerance  . Prednisone Hives     REVIEW OF SYSTEMS (Negative unless checked)  Constitutional: [] Weight loss  [] Fever  [] Chills Cardiac: [] Chest pain   [] Chest  pressure   [] Palpitations   [] Shortness of breath when laying flat   [] Shortness of breath at rest   [] Shortness of breath with exertion. Vascular:  [x] Pain in legs with walking   [] Pain in legs at rest   [] Pain in legs when laying flat   [x] Claudication   [] Pain in feet when walking  [x] Pain in feet at rest  [] Pain in feet when laying flat   [] History of DVT   [] Phlebitis   [x] Swelling in legs   [] Varicose veins   [] Non-healing ulcers Pulmonary:   [] Uses home oxygen   [] Productive cough   [] Hemoptysis   [] Wheeze  [] COPD   [] Asthma Neurologic:  [] Dizziness  [] Blackouts   [] Seizures   [] History of stroke   []   History of TIA  [] Aphasia   [] Temporary blindness   [] Dysphagia   [] Weakness or numbness in arms   [] Weakness or numbness in legs Musculoskeletal:  [x] Arthritis   [] Joint swelling   [] Joint pain   [] Low back pain Hematologic:  [] Easy bruising  [] Easy bleeding   [] Hypercoagulable state   [] Anemic   Gastrointestinal:  [] Blood in stool   [] Vomiting blood  [] Gastroesophageal reflux/heartburn   [] Abdominal pain Genitourinary:  [] Chronic kidney disease   [] Difficult urination  [] Frequent urination  [] Burning with urination   [] Hematuria Skin:  [] Rashes   [] Ulcers   [] Wounds Psychological:  [] History of anxiety   []  History of major depression.  Physical Examination  BP (!) 180/90 (BP Location: Right Arm, Patient Position: Sitting)   Pulse 77   Resp 17   Ht 5' 5.5" (1.664 m)   Wt 97.5 kg (215 lb)   BMI 35.23 kg/m  Gen:  WD/WN, NAD Head: [place]/AT, No temporalis wasting. Ear/Nose/Throat: Hearing grossly intact, nares w/o erythema or drainage, trachea midline Eyes: Conjunctiva clear. Sclera non-icteric Neck: Supple.  No JVD.  Pulmonary:  Good air movement, no use of accessory muscles.  Cardiac: RRR, no JVD Vascular:  Vessel Right Left  Radial Palpable Palpable                          PT  1+ palpable  not palpable  DP  1+ palpable  trace palpable    Musculoskeletal: M/S 5/5 throughout.   No deformity or atrophy.  1+ left lower extremity edema.  Dependent rubor is present in the left foot Neurologic: Sensation grossly intact in extremities.  Symmetrical.  Speech is fluent.  Psychiatric: Judgment intact, Mood & affect appropriate for pt's clinical situation. Dermatologic: No rashes or ulcers noted.  No cellulitis or open wounds.       Labs No results found for this or any previous visit (from the past 2160 hour(s)).  Radiology    Assessment/Plan  Type II diabetes mellitus (HCC) blood glucose control important in reducing the progression of atherosclerotic disease. Also, involved in wound healing. On appropriate medications.   Essential hypertension blood pressure control important in reducing the progression of atherosclerotic disease. On appropriate oral medications.   Atherosclerosis of native arteries of extremity with rest pain (HCC) Her noninvasive studies today demonstrate a mild drop in her right ABI now down to 0.89 with somewhat hyperemic monophasic waveforms.  Her right digital pressure is 81.  On the left, the drop in perfusion has been much more dramatic.  Her left ABI is 0.51 with monophasic wave forms and nondetectable digital pressures. This represents a critical and limb threatening situation.  The acuity of the drop is also concerning as the pain started somewhat abruptly about a week ago.  She will be taken to the angiography suite in the next 24-48 hours where attempt at revascularization will be performed.  It was discussed with the patient that open surgical therapy may be needed.  The patient voices her understanding of this critical and limb threatening situation.    Festus Barren, MD  05/02/2017 3:17 PM    This note was created with Dragon medical transcription system.  Any errors from dictation are purely unintentional

## 2017-05-02 NOTE — Patient Instructions (Signed)

## 2017-05-03 MED ORDER — CEFAZOLIN SODIUM-DEXTROSE 2-4 GM/100ML-% IV SOLN
2.0000 g | Freq: Once | INTRAVENOUS | Status: AC
  Administered 2017-05-04: 2 g via INTRAVENOUS

## 2017-05-04 ENCOUNTER — Encounter
Admission: RE | Disposition: A | Discharge status: Home / Self Care | Payer: Self-pay | Source: Ambulatory Visit | Attending: Vascular Surgery

## 2017-05-04 ENCOUNTER — Ambulatory Visit
Admission: RE | Admit: 2017-05-04 | Discharge: 2017-05-04 | Disposition: A | Discharge status: Home / Self Care | Payer: Medicare HMO | Source: Ambulatory Visit | Attending: Vascular Surgery | Admitting: Vascular Surgery

## 2017-05-04 DIAGNOSIS — Z794 Long term (current) use of insulin: Secondary | ICD-10-CM | POA: Diagnosis not present

## 2017-05-04 DIAGNOSIS — Z79899 Other long term (current) drug therapy: Secondary | ICD-10-CM | POA: Insufficient documentation

## 2017-05-04 DIAGNOSIS — Z885 Allergy status to narcotic agent status: Secondary | ICD-10-CM | POA: Insufficient documentation

## 2017-05-04 DIAGNOSIS — E1159 Type 2 diabetes mellitus with other circulatory complications: Secondary | ICD-10-CM | POA: Diagnosis not present

## 2017-05-04 DIAGNOSIS — Z955 Presence of coronary angioplasty implant and graft: Secondary | ICD-10-CM | POA: Diagnosis not present

## 2017-05-04 DIAGNOSIS — I110 Hypertensive heart disease with heart failure: Secondary | ICD-10-CM | POA: Insufficient documentation

## 2017-05-04 DIAGNOSIS — Z823 Family history of stroke: Secondary | ICD-10-CM | POA: Insufficient documentation

## 2017-05-04 DIAGNOSIS — Z86718 Personal history of other venous thrombosis and embolism: Secondary | ICD-10-CM | POA: Insufficient documentation

## 2017-05-04 DIAGNOSIS — Z888 Allergy status to other drugs, medicaments and biological substances status: Secondary | ICD-10-CM | POA: Insufficient documentation

## 2017-05-04 DIAGNOSIS — Z9851 Tubal ligation status: Secondary | ICD-10-CM | POA: Insufficient documentation

## 2017-05-04 DIAGNOSIS — I6523 Occlusion and stenosis of bilateral carotid arteries: Secondary | ICD-10-CM | POA: Insufficient documentation

## 2017-05-04 DIAGNOSIS — Z91030 Bee allergy status: Secondary | ICD-10-CM | POA: Diagnosis not present

## 2017-05-04 DIAGNOSIS — I5030 Unspecified diastolic (congestive) heart failure: Secondary | ICD-10-CM | POA: Diagnosis not present

## 2017-05-04 DIAGNOSIS — Z886 Allergy status to analgesic agent status: Secondary | ICD-10-CM | POA: Insufficient documentation

## 2017-05-04 DIAGNOSIS — Z9842 Cataract extraction status, left eye: Secondary | ICD-10-CM | POA: Insufficient documentation

## 2017-05-04 DIAGNOSIS — E119 Type 2 diabetes mellitus without complications: Secondary | ICD-10-CM | POA: Diagnosis not present

## 2017-05-04 DIAGNOSIS — E669 Obesity, unspecified: Secondary | ICD-10-CM | POA: Insufficient documentation

## 2017-05-04 DIAGNOSIS — Z951 Presence of aortocoronary bypass graft: Secondary | ICD-10-CM | POA: Insufficient documentation

## 2017-05-04 DIAGNOSIS — Z9841 Cataract extraction status, right eye: Secondary | ICD-10-CM | POA: Insufficient documentation

## 2017-05-04 DIAGNOSIS — I70228 Atherosclerosis of native arteries of extremities with rest pain, other extremity: Secondary | ICD-10-CM | POA: Insufficient documentation

## 2017-05-04 DIAGNOSIS — Z808 Family history of malignant neoplasm of other organs or systems: Secondary | ICD-10-CM | POA: Insufficient documentation

## 2017-05-04 DIAGNOSIS — I70222 Atherosclerosis of native arteries of extremities with rest pain, left leg: Secondary | ICD-10-CM | POA: Diagnosis not present

## 2017-05-04 DIAGNOSIS — Z6835 Body mass index (BMI) 35.0-35.9, adult: Secondary | ICD-10-CM | POA: Diagnosis not present

## 2017-05-04 DIAGNOSIS — Z9889 Other specified postprocedural states: Secondary | ICD-10-CM | POA: Insufficient documentation

## 2017-05-04 DIAGNOSIS — Z8249 Family history of ischemic heart disease and other diseases of the circulatory system: Secondary | ICD-10-CM | POA: Diagnosis not present

## 2017-05-04 DIAGNOSIS — I70229 Atherosclerosis of native arteries of extremities with rest pain, unspecified extremity: Secondary | ICD-10-CM

## 2017-05-04 HISTORY — PX: LOWER EXTREMITY ANGIOGRAPHY: CATH118251

## 2017-05-04 LAB — CREATININE, SERUM
Creatinine, Ser: 0.77 mg/dL (ref 0.44–1.00)
GFR calc Af Amer: >60 mL/min (ref >60)
GFR calc non Af Amer: >60 mL/min (ref >60)

## 2017-05-04 LAB — GLUCOSE, CAPILLARY
Glucose-Capillary: 107 mg/dL — ABNORMAL HIGH (ref 65–99)
Glucose-Capillary: 96 mg/dL (ref 65–99)
Glucose-Capillary: 106 mg/dL — ABNORMAL HIGH (ref 65–99)
Glucose-Capillary: 110 mg/dL — ABNORMAL HIGH (ref 65–99)
Glucose-Capillary: 147 mg/dL — ABNORMAL HIGH (ref 65–99)

## 2017-05-04 LAB — BUN: BUN: 18 mg/dL (ref 6–20)

## 2017-05-04 SURGERY — LOWER EXTREMITY ANGIOGRAPHY
Anesthesia: Moderate Sedation | Laterality: Left

## 2017-05-04 MED ORDER — LABETALOL HCL 5 MG/ML IV SOLN: 10.0000 mg | INTRAVENOUS | Status: DC | PRN

## 2017-05-04 MED ORDER — LIDOCAINE-EPINEPHRINE (PF) 1 %-1:200000 IJ SOLN
INTRAMUSCULAR | Status: AC
  Filled 2017-05-04: qty 30

## 2017-05-04 MED ORDER — ALTEPLASE 2 MG IJ SOLR
INTRAMUSCULAR | Status: DC | PRN
  Administered 2017-05-04: 8 mg

## 2017-05-04 MED ORDER — FAMOTIDINE 20 MG PO TABS: 40.0000 mg | ORAL_TABLET | ORAL | Status: DC | PRN

## 2017-05-04 MED ORDER — MIDAZOLAM HCL 2 MG/2ML IJ SOLN
INTRAMUSCULAR | Status: AC
  Filled 2017-05-04: qty 2

## 2017-05-04 MED ORDER — NITROGLYCERIN 0.4 MG SL SUBL
SUBLINGUAL_TABLET | SUBLINGUAL | Status: AC
  Filled 2017-05-04: qty 1

## 2017-05-04 MED ORDER — DEXTROSE 50 % IV SOLN
25.0000 mL | Freq: Once | INTRAVENOUS | Status: AC
  Administered 2017-05-04: 25 mL via INTRAVENOUS

## 2017-05-04 MED ORDER — METHYLPREDNISOLONE SODIUM SUCC 125 MG IJ SOLR: 125.0000 mg | INTRAMUSCULAR | Status: DC | PRN

## 2017-05-04 MED ORDER — SODIUM CHLORIDE 0.9 % IV SOLN
INTRAVENOUS | Status: DC
  Administered 2017-05-04: 10:00:00 via INTRAVENOUS

## 2017-05-04 MED ORDER — SODIUM CHLORIDE 0.9 % IV SOLN: INTRAVENOUS | Status: DC

## 2017-05-04 MED ORDER — HEPARIN SODIUM (PORCINE) 1000 UNIT/ML IJ SOLN
INTRAMUSCULAR | Status: AC
  Filled 2017-05-04: qty 1

## 2017-05-04 MED ORDER — HYDRALAZINE HCL 20 MG/ML IJ SOLN: 5.0000 mg | INTRAMUSCULAR | Status: DC | PRN

## 2017-05-04 MED ORDER — HEPARIN SODIUM (PORCINE) 1000 UNIT/ML IJ SOLN
INTRAMUSCULAR | Status: DC | PRN
  Administered 2017-05-04: 5000 [IU] via INTRAVENOUS

## 2017-05-04 MED ORDER — MIDAZOLAM HCL 2 MG/2ML IJ SOLN
INTRAMUSCULAR | Status: DC | PRN
  Administered 2017-05-04 (×2): 2 mg via INTRAVENOUS
  Administered 2017-05-04: 1 mg via INTRAVENOUS

## 2017-05-04 MED ORDER — CLOPIDOGREL BISULFATE 75 MG PO TABS: 75.0000 mg | ORAL_TABLET | Freq: Every day | ORAL | Status: DC

## 2017-05-04 MED ORDER — SODIUM CHLORIDE 0.9 % IV SOLN: 250.0000 mL | INTRAVENOUS | Status: DC | PRN

## 2017-05-04 MED ORDER — FENTANYL CITRATE (PF) 100 MCG/2ML IJ SOLN
INTRAMUSCULAR | Status: AC
  Filled 2017-05-04: qty 2

## 2017-05-04 MED ORDER — SODIUM CHLORIDE 0.9% FLUSH: 3.0000 mL | Freq: Two times a day (BID) | INTRAVENOUS | Status: DC

## 2017-05-04 MED ORDER — CLOPIDOGREL BISULFATE 75 MG PO TABS: 75.0000 mg | ORAL_TABLET | Freq: Every day | ORAL | 11 refills | Status: DC

## 2017-05-04 MED ORDER — SODIUM CHLORIDE 0.9% FLUSH: 3.0000 mL | INTRAVENOUS | Status: DC | PRN

## 2017-05-04 MED ORDER — ONDANSETRON HCL 4 MG/2ML IJ SOLN
INTRAMUSCULAR | Status: AC
  Filled 2017-05-04: qty 2

## 2017-05-04 MED ORDER — ALTEPLASE 2 MG IJ SOLR
INTRAMUSCULAR | Status: AC
  Filled 2017-05-04: qty 8

## 2017-05-04 MED ORDER — DEXTROSE 50 % IV SOLN
INTRAVENOUS | Status: AC
  Filled 2017-05-04: qty 50

## 2017-05-04 MED ORDER — HYDROMORPHONE HCL 1 MG/ML IJ SOLN
1.0000 mg | Freq: Once | INTRAMUSCULAR | Status: AC | PRN
  Administered 2017-05-04: 0.5 mg via INTRAVENOUS

## 2017-05-04 MED ORDER — FENTANYL CITRATE (PF) 100 MCG/2ML IJ SOLN
INTRAMUSCULAR | Status: DC | PRN
  Administered 2017-05-04: 50 ug via INTRAVENOUS
  Administered 2017-05-04: 25 ug via INTRAVENOUS

## 2017-05-04 MED ORDER — NITROGLYCERIN 0.4 MG SL SUBL
0.4000 mg | SUBLINGUAL_TABLET | SUBLINGUAL | Status: DC | PRN
  Administered 2017-05-04: 0.4 mg via SUBLINGUAL

## 2017-05-04 MED ORDER — IOPAMIDOL (ISOVUE-300) INJECTION 61%
INTRAVENOUS | Status: DC | PRN
  Administered 2017-05-04: 60 mL via INTRA_ARTERIAL

## 2017-05-04 MED ORDER — HYDROMORPHONE HCL 1 MG/ML IJ SOLN
INTRAMUSCULAR | Status: AC
  Administered 2017-05-04: 0.5 mg via INTRAVENOUS
  Filled 2017-05-04: qty 1

## 2017-05-04 MED ORDER — ONDANSETRON HCL 4 MG/2ML IJ SOLN
4.0000 mg | Freq: Four times a day (QID) | INTRAMUSCULAR | Status: DC | PRN
  Administered 2017-05-04: 4 mg via INTRAVENOUS

## 2017-05-04 MED ORDER — HEPARIN (PORCINE) IN NACL 2-0.9 UNIT/ML-% IJ SOLN
INTRAMUSCULAR | Status: AC
  Filled 2017-05-04: qty 1000

## 2017-05-04 MED ORDER — ONDANSETRON HCL 4 MG/2ML IJ SOLN
INTRAMUSCULAR | Status: AC
  Administered 2017-05-04: 4 mg
  Filled 2017-05-04: qty 2

## 2017-05-04 SURGICAL SUPPLY — 22 items
BALLN LUTONIX 018 4X150X130 (BALLOONS) ×3
BALLN STERLING OTW 5X60X135 (BALLOONS) ×3
BALLN ULTRVRSE 3X300X150 (BALLOONS) ×3
BALLN ULTRVRSE 3X300X150 OTW (BALLOONS) ×1
BALLOON LUTONIX 018 4X150X130 (BALLOONS) ×1 IMPLANT
BALLOON STERLING OTW 5X60X135 (BALLOONS) ×1 IMPLANT
BALLOON ULTRVRSE 3X300X150 OTW (BALLOONS) ×1 IMPLANT
CATH BEACON 5 .038 100 VERT TP (CATHETERS) ×3 IMPLANT
CATH INFUS 135CMX30CM (CATHETERS) ×3 IMPLANT
CATH PIG 70CM (CATHETERS) ×3 IMPLANT
DEVICE PRESTO INFLATION (MISCELLANEOUS) ×3 IMPLANT
DEVICE STARCLOSE SE CLOSURE (Vascular Products) ×3 IMPLANT
GLIDEWIRE ADV .035X260CM (WIRE) ×3 IMPLANT
PACK ANGIOGRAPHY (CUSTOM PROCEDURE TRAY) ×3 IMPLANT
SHEATH BRITE TIP 5FRX11 (SHEATH) ×3 IMPLANT
SHEATH RAABE 6FRX70 (SHEATH) ×3 IMPLANT
STENT VIABAHN 6X50X120 (Permanent Stent) ×3 IMPLANT
SYR MEDRAD MARK V 150ML (SYRINGE) ×3 IMPLANT
TUBING ASPIRATION INDIGO (MISCELLANEOUS) IMPLANT
TUBING CONTRAST HIGH PRESS 72 (TUBING) ×3 IMPLANT
WIRE G V18X300CM (WIRE) ×3 IMPLANT
WIRE J 3MM .035X145CM (WIRE) ×3 IMPLANT

## 2017-05-04 NOTE — H&P (Signed)
[facility] History & Physical Update  The patient was interviewed and re-examined.  The patient's previous History and Physical has been reviewed and is unchanged.  There is no change in the plan of care. We plan to proceed with the scheduled procedure.  Festus Barren, MD  05/04/2017, 8:21 AM

## 2017-05-04 NOTE — Op Note (Signed)
[name] Percutaneous Study/Intervention Procedural Note   Date of Surgery: 05/04/2017  Surgeon(s):Drue Harr   Assistants:none  Pre-operative Diagnosis: PAD with rest [name] left foot, acute on chronic disease  Post-operative diagnosis: Same  Procedure(s) Performed: 1. Ultrasound guidance for vascular access right femoral artery 2. Catheter placement into left peroneal artery and anterior tibial artery approach 3. Aortogram and selective left lower extremity angiogram 4. Catheter directed thrombolytic therapy with 8 mg of TPA placed with an infusion catheter to the left popliteal artery, tibioperoneal trunk, and proximal peroneal arteries 5. Percutaneous transluminal angioplasty of the left anterior tibial artery with 3 mm diameter by 30 cm length angioplasty balloon  6.  Percutaneous transluminal angioplasty of the left popliteal artery with 4 mm diameter by 15 cm length Lutonix drug-coated angioplasty balloon  7.  Viabahn stent placement to the left popliteal artery with 6 mm diameter of centimeters length covered stent 8. StarClose closure device right femoral artery  EBL: 10 cc  Contrast: 60 cc  Fluoro Time: 6.6 minutes  Moderate Conscious Sedation Time: approximately 50 minutes using 5 mg of Versed and 75 Mcg of Fentanyl  Indications: Patient is a 72 y.o.female with rest [name] of the left foot with [name] starting about a week ago having otherwise been fairly normal prior to that. The patient has noninvasive study showing a marked reduction in her left ABI now down to 0.5 which was previously normal several months ago. The patient is brought in for angiography for further evaluation and potential treatment.  Due to the limb threatening nature of the situation, angiogram was performed for attempted limb salvage. Risks and benefits are discussed and informed consent  is obtained  Procedure: The patient was identified and appropriate procedural time out was performed. The patient was then placed supine on the table and prepped and draped in the usual sterile fashion.Moderate conscious sedation was administered during a face to face encounter with the patient throughout the procedure with my supervision of the RN administering medicines and monitoring the patient's vital signs, pulse oximetry, telemetry and mental status throughout from the start of the procedure until the patient was taken to the recovery room. Ultrasound was used to evaluate the right common femoral artery. It was patent . A digital ultrasound image was acquired. A Seldinger needle was used to access the right common femoral artery under direct ultrasound guidance and a permanent image was performed. A 0.035 J wire was advanced without resistance and a 5Fr sheath was placed. Pigtail catheter was placed into the aorta and an AP aortogram was performed. This demonstrated normal renal arteries and normal aorta and iliac segments without significant stenosis. I then crossed the aortic bifurcation and advanced to the left femoral head. Selective left lower extremity angiogram was then performed. This demonstrated normal common femoral artery, profunda femoris artery, and superficial femoral artery.  The popliteal artery had an abrupt near occlusive lesion just above the knee that was likely some portion of embolic or thrombotic disease.  There was some thrombus throughout the remainder of the popliteal artery and down into the tibioperoneal trunk.  It was somewhat difficult to opacify the tibials but it appeared as if the anterior tibial artery was probably the best runoff distally. The patient was systemically heparinized and a 6 [place] Ansell sheath was then placed over the [employer] wire. I then used a Kumpe catheter and the advantage wire to get down into the TP trunk and proximal peroneal  artery.  I then  used a 30 cm working length 130 cm total length lytic catheter and deployed 8 mg of TPA throughout the popliteal artery and down through the TP trunk and proximal peroneal artery.  This was allowed to dwell for 20 minutes.  Imaging was then performed which did show some improvement with now only a high-grade stenosis in the above-knee popliteal artery and mild residual thrombus disease in the below-knee popliteal artery.  The anterior tibial artery was indeed the best runoff but it had about an 80% stenosis 5 cm beyond its origin and 2 other areas of moderate stenosis in the 50-60% range in the mid segments.  The peroneal and posterior tibial arteries appeared to be occluded more distally.  I then redirected a Kumpe catheter and a 0.018 wire down the anterior tibial artery and parked this into the foot.  I then proceeded with angioplasty.  A 3 mm diameter by 30 cm length angioplasty balloon was inflated to 10 atm throughout the proximal and mid anterior tibial artery down to the posterior anterior tibial artery.  Completion angiogram showed less than 30% residual stenosis within the anterior tibial artery following this.  The popliteal artery was treated with a 4 mm diameter by 15 cm length Lutonix drug-coated angioplasty balloon inflated to 8 atm for 1 minute.  Completion angiogram still showed a greater than 50% residual stenosis in this area which was likely an area of possible embolus by the initial appearance.  I elected to  cover this area with a covered stent.  A 6 mm diameter by 5 cm stent was deployed in the popliteal artery just above the knee and postdilated with a 5 mm balloon with excellent angiographic completion result and less than 10% residual stenosis.  I elected to terminate the procedure. The sheath was removed and StarClose closure device was deployed in the right femoral artery with excellent hemostatic result. The patient was taken to the recovery room in stable condition  having tolerated the procedure well.  Findings:  Aortogram:Normal renal arteries, normal aorta and iliac arteries without significant stenosis. Left lower Extremity:Normal common femoral artery, profunda femoris artery, and superficial femoral artery.  The popliteal artery had an abrupt near occlusive lesion just above the knee that was likely some portion of embolic or thrombotic disease.  There was some thrombus throughout the remainder of the popliteal artery and down into the tibioperoneal trunk.  It was somewhat difficult to opacify the tibials but it appeared as if the anterior tibial artery was probably the best runoff distally.   Disposition: Patient was taken to the recovery room in stable condition having tolerated the procedure well.  Complications: None  [name] 05/04/2017 12:19 PM   This note was created with Dragon Medical transcription system. Any errors in dictation are purely unintentional.

## 2017-05-10 ENCOUNTER — Telehealth (INDEPENDENT_AMBULATORY_CARE_PROVIDER_SITE_OTHER): Payer: Self-pay

## 2017-05-10 NOTE — Telephone Encounter (Signed)
Patient had left a message regarding having foot pain after angio on 05/04/17.I spoke with KS and advise that she wanted the patient to have an abi done.I spoke with patient and inform her that KS wanted abi done and she inform that her pain was much better and she will be going to see her wound doctor on 05/11/17 and she having any other problems she will call the office.

## 2017-05-11 ENCOUNTER — Encounter: Payer: Medicare HMO | Attending: Nurse Practitioner | Admitting: Nurse Practitioner

## 2017-05-11 DIAGNOSIS — I70202 Unspecified atherosclerosis of native arteries of extremities, left leg: Secondary | ICD-10-CM | POA: Insufficient documentation

## 2017-05-11 DIAGNOSIS — E785 Hyperlipidemia, unspecified: Secondary | ICD-10-CM | POA: Insufficient documentation

## 2017-05-11 DIAGNOSIS — L97421 Non-pressure chronic ulcer of left heel and midfoot limited to breakdown of skin: Secondary | ICD-10-CM | POA: Insufficient documentation

## 2017-05-11 DIAGNOSIS — Z86718 Personal history of other venous thrombosis and embolism: Secondary | ICD-10-CM | POA: Insufficient documentation

## 2017-05-11 DIAGNOSIS — E11621 Type 2 diabetes mellitus with foot ulcer: Secondary | ICD-10-CM | POA: Diagnosis present

## 2017-05-11 DIAGNOSIS — M1990 Unspecified osteoarthritis, unspecified site: Secondary | ICD-10-CM | POA: Diagnosis not present

## 2017-05-11 DIAGNOSIS — E1151 Type 2 diabetes mellitus with diabetic peripheral angiopathy without gangrene: Secondary | ICD-10-CM | POA: Diagnosis not present

## 2017-05-11 DIAGNOSIS — I509 Heart failure, unspecified: Secondary | ICD-10-CM | POA: Insufficient documentation

## 2017-05-11 DIAGNOSIS — Z885 Allergy status to narcotic agent status: Secondary | ICD-10-CM | POA: Diagnosis not present

## 2017-05-11 DIAGNOSIS — E11610 Type 2 diabetes mellitus with diabetic neuropathic arthropathy: Secondary | ICD-10-CM | POA: Insufficient documentation

## 2017-05-11 DIAGNOSIS — Z886 Allergy status to analgesic agent status: Secondary | ICD-10-CM | POA: Diagnosis not present

## 2017-05-11 DIAGNOSIS — Z888 Allergy status to other drugs, medicaments and biological substances status: Secondary | ICD-10-CM | POA: Diagnosis not present

## 2017-05-11 DIAGNOSIS — I2510 Atherosclerotic heart disease of native coronary artery without angina pectoris: Secondary | ICD-10-CM | POA: Insufficient documentation

## 2017-05-12 NOTE — Progress Notes (Addendum)
DEA, BITTING ([id number]) Visit Report for 05/11/2017 Allergy List Details Patient Name: [name], [name]. Date of Service: 05/11/2017 12:30 PM Medical Record Number: [id number] Patient Account Number: [id number] Date of Birth/Sex: 13-Aug-1945 (72 y.o. Female) Treating RN: Montey Hora Primary Care Najah Liverman: Dion Body Other Clinician: Referring Lateesha Bezold: Referral, Self Treating Kebin Maye/Extender: Cathie Olden in Treatment: 0 Allergies Active Allergies aspirin atorvastatin bee venom protein (honey bee) codeine cortisone Demerol meperidine metformin prednisone lisinopril Statins-Hmg-Coa Reductase Inhibitors Tylenol Allergy Notes Electronic Signature(s) Signed: 05/11/2017 10:02:26 AM By: Montey Hora Entered By: Montey Hora on 05/11/2017 10:02:26 Gundy, Ellenore R. ([id number]) -------------------------------------------------------------------------------- Arrival Information Details Patient Name: Kristy Bible R. Date of Service: 05/11/2017 12:30 PM Medical Record Number: [id number] Patient Account Number: [id number] Date of Birth/Sex: 10-08-1945 (72 y.o. Female) Treating RN: Montey Hora Primary Care Yaphet Smethurst: Dion Body Other Clinician: Referring Makhya Arave: Referral, Self Treating Consepcion Utt/Extender: Cathie Olden in Treatment: 0 Visit Information Patient Arrived: Wheel Chair Arrival Time: 12:40 Accompanied By: dtr Transfer Assistance: None Patient Identification Verified: Yes Secondary Verification Process Yes Completed: Patient Has Alerts: Yes Patient Alerts: ABI 05/02/2017 AVVS L0.51 R 0.89 DMII Electronic Signature(s) Signed: 05/11/2017 4:10:15 PM By: Montey Hora Entered By: Montey Hora on 05/11/2017 12:43:32 [place], [place]. ([id number]) -------------------------------------------------------------------------------- Clinic Level of Care Assessment Details Patient Name: [name], Kristy R. Date of Service: 05/11/2017  12:30 PM Medical Record Number: [id number] Patient Account Number: [id number] Date of Birth/Sex: [birth year] (73 y.o. Female) Treating RN: Ahmed Prima Primary Care Lateia Fraser: Dion Body Other Clinician: Referring Dorrell Mitcheltree: Referral, Self Treating [place] Basham/Extender: Cathie Olden in Treatment: 0 Clinic Level of Care Assessment Items TOOL 2 Quantity Score X - Use when only an EandM is performed on the INITIAL visit 1 0 ASSESSMENTS - Nursing Assessment / Reassessment X - General Physical Exam (combine w/ comprehensive assessment (listed just below) when 1 20 performed on new pt. evals) X- 1 25 Comprehensive Assessment (HX, ROS, Risk Assessments, Wounds Hx, etc.) ASSESSMENTS - Wound and Skin Assessment / Reassessment X - Simple Wound Assessment / Reassessment - one wound 1 5 []  - 0 Complex Wound Assessment / Reassessment - multiple wounds []  - 0 Dermatologic / Skin Assessment (not related to wound area) ASSESSMENTS - Ostomy and/or Continence Assessment and Care []  - Incontinence Assessment and Management 0 []  - 0 Ostomy Care Assessment and Management (repouching, etc.) PROCESS - Coordination of Care X - Simple Patient / Family Education for ongoing care 1 15 []  - 0 Complex (extensive) Patient / Family Education for ongoing care []  - 0 Staff obtains Programmer, systems, Records, Test Results / Process Orders []  - 0 Staff telephones HHA, Nursing Homes / Clarify orders / etc []  - 0 Routine Transfer to another Facility (non-emergent condition) []  - 0 Routine Hospital Admission (non-emergent condition) []  - 0 New Admissions / Biomedical engineer / Ordering NPWT, Apligraf, etc. []  - 0 Emergency Hospital Admission (emergent condition) X- 1 10 Simple Discharge Coordination []  - 0 Complex (extensive) Discharge Coordination PROCESS - Special Needs []  - Pediatric / Minor Patient Management 0 []  - 0 Isolation Patient Management Hamler, Jeanetta R. ([id number]) []  - 0 Hearing  / Language / Visual special needs []  - 0 Assessment of Community assistance (transportation, D/C planning, etc.) []  - 0 Additional assistance / Altered mentation []  - 0 Support Surface(s) Assessment (bed, cushion, seat, etc.) INTERVENTIONS - Wound Cleansing / Measurement X - Wound Imaging (photographs - any number of wounds) 1 5 []  - 0 Wound Tracing (instead of photographs) X- 1 5 Simple Wound  Measurement - one wound []  - 0 Complex Wound Measurement - multiple wounds X- 1 5 Simple Wound Cleansing - one wound []  - 0 Complex Wound Cleansing - multiple wounds INTERVENTIONS - Wound Dressings X - Small Wound Dressing one or multiple wounds 1 10 []  - 0 Medium Wound Dressing one or multiple wounds []  - 0 Large Wound Dressing one or multiple wounds []  - 0 Application of Medications - injection INTERVENTIONS - Miscellaneous []  - External ear exam 0 []  - 0 Specimen Collection (cultures, biopsies, blood, body fluids, etc.) []  - 0 Specimen(s) / Culture(s) sent or taken to Lab for analysis []  - 0 Patient Transfer (multiple staff / Civil Service fast streamer / Similar devices) []  - 0 Simple Staple / Suture removal (25 or less) []  - 0 Complex Staple / Suture removal (26 or more) []  - 0 Hypo / Hyperglycemic Management (close monitor of Blood Glucose) []  - 0 Ankle / Brachial Index (ABI) - do not check if billed separately Has the patient been seen at the hospital within the last three years: Yes Total Score: 100 Level Of Care: New/Established - Level 3 Electronic Signature(s) Signed: 05/11/2017 4:59:29 PM By: Alric Quan Entered By: Alric Quan on 05/11/2017 14:13:13 Reasner, [place] R. ([id number]) -------------------------------------------------------------------------------- Encounter Discharge Information Details Patient Name: [name], Kristy R. Date of Service: 05/11/2017 12:30 PM Medical Record Number: [id number] Patient Account Number: [id number] Date of Birth/Sex: [birth year] (72 y.o.  Female) Treating RN: Ahmed Prima Primary Care Octavia Mottola: Dion Body Other Clinician: Referring Becci Batty: Referral, Self Treating Dejuana Weist/Extender: Cathie Olden in Treatment: 0 Encounter Discharge Information Items Schedule Follow-up Appointment: No Medication Reconciliation completed and No provided to Patient/Care Dianna Ewald: Provided on Clinical Summary of Care: 05/11/2017 Form Type Recipient Paper Patient [facility] Electronic Signature(s) Signed: 05/12/2017 10:09:42 AM By: Ruthine Dose Entered By: Ruthine Dose on 05/11/2017 13:27:04 [place], [place]. ([id number]) -------------------------------------------------------------------------------- Lower Extremity Assessment Details Patient Name: [name], Kristy R. Date of Service: 05/11/2017 12:30 PM Medical Record Number: [id number] Patient Account Number: [id number] Date of Birth/Sex: 1945/12/02 (72 y.o. Female) Treating RN: Montey Hora Primary Care Ileigh Mettler: Dion Body Other Clinician: Referring Deziray Nabi: Referral, Self Treating Magen Suriano/Extender: Cathie Olden in Treatment: 0 Edema Assessment Assessed: [Left: No] [Right: No] [Left: Edema] [Right: :] Calf Left: Right: Point of Measurement: 32 cm From Medial Instep 37.3 cm 37.5 cm Ankle Left: Right: Point of Measurement: 12 cm From Medial Instep 22.2 cm 23 cm Vascular Assessment Pulses: Dorsalis Pedis Palpable: [Left:Yes] [Right:Yes] Doppler Audible: [Left:Yes] [Right:Yes] Posterior Tibial Palpable: [Left:Yes] [Right:Yes] Doppler Audible: [Left:Yes] [Right:Yes] Extremity colors, hair growth, and conditions: Extremity Color: [Left:Hyperpigmented] [Right:Hyperpigmented] Hair Growth on Extremity: [Left:No] [Right:No] Temperature of Extremity: [Left:Warm] [Right:Warm] Capillary Refill: [Left:< 3 seconds] [Right:< 3 seconds] Toe Nail Assessment Left: Right: Thick: Yes Yes Discolored: Yes Yes Deformed: Yes Yes Improper Length and Hygiene: Yes  Yes Electronic Signature(s) Signed: 05/11/2017 4:10:15 PM By: Montey Hora Entered By: Montey Hora on 05/11/2017 12:59:22 [place], [place]. ([id number]) -------------------------------------------------------------------------------- Multi Wound Chart Details Patient Name: [name], Kristy R. Date of Service: 05/11/2017 12:30 PM Medical Record Number: [id number] Patient Account Number: [id number] Date of Birth/Sex: [birth year] (72 y.o. Female) Treating RN: Ahmed Prima Primary Care Audine Mangione: Dion Body Other Clinician: Referring Salem Lembke: Referral, Self Treating Kenyata Guess/Extender: Cathie Olden in Treatment: 0 Vital Signs Height(in): 65 Pulse(bpm): 10 Weight(lbs): Blood Pressure(mmHg): 142/48 Body Mass Index(BMI): Temperature(F): 98.0 Respiratory Rate 16 (breaths/min): Photos: [1:No Photos] [N/A:N/A] Wound Location: [1:Left Calcaneus] [N/A:N/A] Wounding Event: [1:Gradually Appeared] [N/A:N/A] Primary Etiology: [1:Diabetic Wound/Ulcer of the Lower Extremity] [N/A:N/A]  Comorbid History: [1:Angina, Congestive Heart Failure, Coronary Artery Disease, Deep Vein Thrombosis, Peripheral Venous Disease, Type II Diabetes, Osteoarthritis, Neuropathy] [N/A:N/A] Date Acquired: [1:04/17/2017] [N/A:N/A] Weeks of Treatment: [1:0] [N/A:N/A] Wound Status: [1:Open] [N/A:N/A] Pending Amputation on [1:Yes] [N/A:N/A] Presentation: Measurements L x W x D [1:1x0.4x0.2] [N/A:N/A] (cm) Area (cm) : [1:0.314] [N/A:N/A] Volume (cm) : [1:0.063] [N/A:N/A] Classification: [1:Grade 1] [N/A:N/A] Exudate Amount: [1:Medium] [N/A:N/A] Exudate Type: [1:Serous] [N/A:N/A] Exudate Color: [1:amber] [N/A:N/A] Wound Margin: [1:Flat and Intact] [N/A:N/A] Granulation Amount: [1:Large (67-100%)] [N/A:N/A] Granulation Quality: [1:Pink] [N/A:N/A] Necrotic Amount: [1:None Present (0%)] [N/A:N/A] Exposed Structures: [1:Fat Layer (Subcutaneous Tissue) Exposed: Yes Fascia: No Tendon: No Muscle: No  Joint: No Bone: No] [N/A:N/A] Epithelialization: [1:None] [N/A:N/A] Periwound Skin Texture: Excoriation: No N/A N/A Induration: No Callus: No Crepitus: No Rash: No Scarring: No Periwound Skin Moisture: Maceration: No N/A N/A Dry/Scaly: No Periwound Skin Color: Erythema: Yes N/A N/A Atrophie Blanche: No Cyanosis: No Ecchymosis: No Hemosiderin Staining: No Mottled: No Pallor: No Rubor: No Erythema Location: Circumferential N/A N/A Temperature: Hot N/A N/A Tenderness on Palpation: Yes N/A N/A Wound Preparation: Ulcer Cleansing: N/A N/A Rinsed/Irrigated with Saline Topical Anesthetic Applied: Other: lidocaine 4% Treatment Notes Electronic Signature(s) Signed: 05/11/2017 1:23:39 PM By: Lawanda Cousins Entered By: Lawanda Cousins on 05/11/2017 13:23:39 Pinnock, [place]. ([id number]) -------------------------------------------------------------------------------- [place] Details Patient Name: [name], Kristy R. Date of Service: 05/11/2017 12:30 PM Medical Record Number: [id number] Patient Account Number: [id number] Date of Birth/Sex: 01-02-1946 (72 y.o. Female) Treating RN: Ahmed Prima Primary Care [place] Devine: Dion Body Other Clinician: Referring Chera Slivka: Referral, Self Treating Kaliopi Blyden/Extender: Cathie Olden in Treatment: 0 Active Inactive ` Orientation to the Wound Care Program Nursing Diagnoses: Knowledge deficit related to the wound healing center program Goals: Patient/caregiver will verbalize understanding of the [place] Program Date Initiated: 05/11/2017 Target Resolution Date: 05/20/2017 Goal Status: Active Interventions: Provide education on orientation to the wound center Notes: ` Pain, Acute or Chronic Nursing Diagnoses: Pain, acute or chronic: actual or potential Potential alteration in comfort, pain Goals: Patient/caregiver will verbalize adequate pain control between visits Date Initiated:  05/11/2017 Target Resolution Date: 07/22/2017 Goal Status: Active Interventions: Complete pain assessment as per visit requirements Notes: ` Wound/Skin Impairment Nursing Diagnoses: Impaired tissue integrity Knowledge deficit related to ulceration/compromised skin integrity Goals: Ulcer/skin breakdown will have a volume reduction of 80% by week 12 Date Initiated: 05/11/2017 Target Resolution Date: 08/12/2017 Goal Status: Active YAHAYRA, GEIS R. ([id number]) Interventions: Assess ulceration(s) every visit Notes: Electronic Signature(s) Signed: 05/11/2017 4:59:29 PM By: Alric Quan Entered By: Alric Quan on 05/11/2017 13:09:49 Koker, [place]. ([id number]) -------------------------------------------------------------------------------- Non-Wound Condition Assessment Details Patient Name: [name], Kristy R. Date of Service: 05/11/2017 12:30 PM Medical Record Number: [id number] Patient Account Number: [id number] Date of Birth/Sex: 1946/02/17 (72 y.o. Female) Treating RN: Montey Hora Primary Care Schelly Chuba: Dion Body Other Clinician: Referring Garlon Tuggle: Referral, Self Treating Amoni Scallan/Extender: Cathie Olden in Treatment: 0 Non-Wound Condition: Condition: Other Dermatologic Condition Location: Foot Side: Left Periwound Skin Texture Texture Color No Abnormalities Noted: No No Abnormalities Noted: No Moisture No Abnormalities Noted: No Notes patient has 2 small blisters on her left 5th met head that are intact and non tender Electronic Signature(s) Signed: 05/11/2017 1:09:22 PM By: Montey Hora Entered By: Montey Hora on 05/11/2017 13:09:22 Trunnell, Jaylise R. ([id number]) -------------------------------------------------------------------------------- Pain Assessment Details Patient Name: [name], Kristy R. Date of Service: 05/11/2017 12:30 PM Medical Record Number: [id number] Patient Account Number: [id number] Date of Birth/Sex: 09-22-1945 (72 y.o.  Female) Treating RN: Montey Hora Primary Care Izak Anding: Netty Starring,  Lucianne Muss Other Clinician: Referring Crescencio Jozwiak: Referral, Self Treating Aiven Kampe/Extender: Cathie Olden in Treatment: 0 Active Problems Location of Pain Severity and Description of Pain Patient Has Paino Yes Site Locations Pain Location: Pain in Ulcers With Dressing Change: Yes Duration of the Pain. Constant / Intermittento Constant Pain Management and Medication Current Pain Management: Electronic Signature(s) Signed: 05/11/2017 4:10:15 PM By: Montey Hora Entered By: Montey Hora on 05/11/2017 12:43:52 Tomaso, Jadalynn R. ([id number]) -------------------------------------------------------------------------------- Wound Assessment Details Patient Name: [name], Kristy R. Date of Service: 05/11/2017 12:30 PM Medical Record Number: [id number] Patient Account Number: [id number] Date of Birth/Sex: June 22, 1945 (72 y.o. Female) Treating RN: Montey Hora Primary Care Jen Benedict: Dion Body Other Clinician: Referring Tyna Huertas: Referral, Self Treating Edythe Riches/Extender: Cathie Olden in Treatment: 0 Wound Status Wound Number: 1 Primary Diabetic Wound/Ulcer of the Lower Extremity Etiology: Wound Location: Left Calcaneus Wound Open Wounding Event: Gradually Appeared Status: Date Acquired: 04/17/2017 Comorbid Angina, Congestive Heart Failure, Coronary Weeks Of Treatment: 0 History: Artery Disease, Deep Vein Thrombosis, Clustered Wound: No Peripheral Venous Disease, Type II Diabetes, Pending Amputation On Presentation Osteoarthritis, Neuropathy Photos Photo Uploaded By: Montey Hora on 05/11/2017 13:26:12 Wound Measurements Length: (cm) 1 Width: (cm) 0.4 Depth: (cm) 0.2 Area: (cm) 0.314 Volume: (cm) 0.063 % Reduction in Area: % Reduction in Volume: Epithelialization: None Tunneling: No Undermining: No Wound Description Classification: Grade 1 Wound Margin: Flat and Intact Exudate  Amount: Medium Exudate Type: Serous Exudate Color: amber Foul Odor After Cleansing: No Slough/Fibrino No Wound Bed Granulation Amount: Large (67-100%) Exposed Structure Granulation Quality: Pink Fascia Exposed: No Necrotic Amount: None Present (0%) Fat Layer (Subcutaneous Tissue) Exposed: Yes Tendon Exposed: No Muscle Exposed: No Joint Exposed: No Bone Exposed: No [place], Kristy R. ([id number]) Periwound Skin Texture Texture Color No Abnormalities Noted: No No Abnormalities Noted: No Callus: No Atrophie Blanche: No Crepitus: No Cyanosis: No Excoriation: No Ecchymosis: No Induration: No Erythema: Yes Rash: No Erythema Location: Circumferential Scarring: No Hemosiderin Staining: No Mottled: No Moisture Pallor: No No Abnormalities Noted: No Rubor: No Dry / Scaly: No Maceration: No Temperature / Pain Temperature: Hot Tenderness on Palpation: Yes Wound Preparation Ulcer Cleansing: Rinsed/Irrigated with Saline Topical Anesthetic Applied: Other: lidocaine 4%, Electronic Signature(s) Signed: 05/11/2017 4:10:15 PM By: Montey Hora Entered By: Montey Hora on 05/11/2017 12:55:46 [name], [place]. ([id number]) -------------------------------------------------------------------------------- Vitals Details Patient Name: Kristy Bible R. Date of Service: 05/11/2017 12:30 PM Medical Record Number: [id number] Patient Account Number: [id number] Date of Birth/Sex: 1945-06-04 (72 y.o. Female) Treating RN: Montey Hora Primary Care Ashtian Villacis: Dion Body Other Clinician: Referring Toleen Lachapelle: Referral, Self Treating Ovella Manygoats/Extender: Cathie Olden in Treatment: 0 Vital Signs Time Taken: 12:43 Temperature (F): 98.0 Height (in): 65 Pulse (bpm): 79 Source: Measured Respiratory Rate (breaths/min): 16 Blood Pressure (mmHg): 142/48 Reference Range: 80 - 120 mg / dl Electronic Signature(s) Signed: 05/11/2017 4:10:15 PM By: Montey Hora Entered By: Montey Hora on 05/11/2017 12:45:42

## 2017-05-12 NOTE — Progress Notes (Addendum)
Kristy, [name] ([id number]) Visit Report for 05/11/2017 Abuse/Suicide Risk Screen Details Patient Name: [name], [name]. Date of Service: 05/11/2017 12:30 PM Medical Record Number: [id number] Patient Account Number: [id number] Date of Birth/Sex: 1945-10-07 (72 y.o. Female) Treating RN: Curtis Sites Primary Care Jolisa Intriago: [name] Other Clinician: Referring Shakti Fleer: Referral, Self Treating Earon Rivest/Extender: Kathreen Cosier in Treatment: 0 Abuse/Suicide Risk Screen Items Answer ABUSE/SUICIDE RISK SCREEN: Has anyone close to you tried to hurt or harm you recentlyo No Do you feel uncomfortable with anyone in your familyo No Has anyone forced you do things that you didnot want to doo No Do you have any thoughts of harming yourselfo No Patient displays signs or symptoms of abuse and/or neglect. No Electronic Signature(s) Signed: 05/11/2017 9:59:46 AM By: Curtis Sites Entered By: Curtis Sites on 05/11/2017 09:59:46 Whetsell, Leola R. ([id number]) -------------------------------------------------------------------------------- Activities of Daily Living Details Patient Name: Kristy, [name] Date of Service: 05/11/2017 12:30 PM Medical Record Number: [id number] Patient Account Number: [id number] Date of Birth/Sex: 10/06/1945 (72 y.o. Female) Treating RN: Curtis Sites Primary Care Ekam Besson: [name] Other Clinician: Referring Giomar Gusler: Referral, Self Treating Larosa Rhines/Extender: Kathreen Cosier in Treatment: 0 Activities of Daily Living Items Answer Activities of Daily Living (Please select one for each item) Drive Automobile Completely Able Take Medications Completely Able Use Telephone Completely Able Care for Appearance Completely Able Use Toilet Completely Able Bath / Shower Completely Able Dress Self Completely Able Feed Self Completely Able Walk Completely Able Get In / Out Bed Completely Able Housework Completely Able Prepare Meals  Completely Able Handle Money Completely Able Shop for Self Completely Able Electronic Signature(s) Signed: 05/11/2017 4:10:15 PM By: Curtis Sites Entered By: Curtis Sites on 05/11/2017 12:47:51 Cerrone, Mikaiah RMarland Kitchen ([id number]) -------------------------------------------------------------------------------- Education Assessment Details Patient Name: Kristy Grizzle R. Date of Service: 05/11/2017 12:30 PM Medical Record Number: [id number] Patient Account Number: [id number] Date of Birth/Sex: January 25, 1946 (72 y.o. Female) Treating RN: Curtis Sites Primary Care Teodoro Jeffreys: [name] Other Clinician: Referring Nawal Burling: Referral, Self Treating Tyliah Schlereth/Extender: Kathreen Cosier in Treatment: 0 Primary Learner Assessed: Caregiver daughter Reason Patient is not Primary Learner: wound location Learning Preferences/Education Level/Primary Language Learning Preference: Explanation, Demonstration Highest Education Level: College or Above Preferred Language: English Cognitive Barrier Assessment/Beliefs Language Barrier: No Translator Needed: No Memory Deficit: No Emotional Barrier: No Cultural/Religious Beliefs Affecting Medical Care: No Physical Barrier Assessment Impaired Vision: No Impaired Hearing: No Decreased Hand dexterity: No Knowledge/Comprehension Assessment Knowledge Level: Medium Comprehension Level: Medium Ability to understand written Medium instructions: Ability to understand verbal Medium instructions: Motivation Assessment Anxiety Level: Calm Cooperation: Cooperative Education Importance: Acknowledges Need Interest in Health Problems: Asks Questions Perception: Coherent Willingness to Engage in Self- Medium Management Activities: Readiness to Engage in Self- Medium Management Activities: Electronic Signature(s) Signed: 05/11/2017 4:10:15 PM By: Curtis Sites Entered By: Curtis Sites on 05/11/2017 12:48:29 Croy, Arliene R.  ([id number]) -------------------------------------------------------------------------------- Fall Risk Assessment Details Patient Name: Kristy, [name] Date of Service: 05/11/2017 12:30 PM Medical Record Number: [id number] Patient Account Number: [id number] Date of Birth/Sex: 1946/03/14 (73 y.o. Female) Treating RN: Curtis Sites Primary Care Trinitey Roache: [name] Other Clinician: Referring Rosenda Geffrard: Referral, Self Treating Elanore Talcott/Extender: Kathreen Cosier in Treatment: 0 Fall Risk Assessment Items Have you had 2 or more falls in the last 12 monthso 0 No Have you had any fall that resulted in injury in the last 12 monthso 0 No FALL RISK ASSESSMENT: History of falling - immediate or within 3 months 0 No Secondary diagnosis 0 No Ambulatory aid None/bed rest/wheelchair/nurse 0  Yes Crutches/cane/walker 0 No Furniture 0 No IV Access/Saline Lock 0 No Gait/Training Normal/bed rest/immobile 0 No Weak 10 Yes Impaired 0 No Mental Status Oriented to own ability 0 Yes Electronic Signature(s) Signed: 05/11/2017 4:10:15 PM By: Curtis Sites Entered By: Curtis Sites on 05/11/2017 12:48:49 Francis, Keliyah R. ([id number]) -------------------------------------------------------------------------------- Foot Assessment Details Patient Name: Kristy, [name] Date of Service: 05/11/2017 12:30 PM Medical Record Number: [id number] Patient Account Number: [id number] Date of Birth/Sex: March 08, 1946 (72 y.o. Female) Treating RN: Curtis Sites Primary Care Brentlee Sciara: [name] Other Clinician: Referring Olaoluwa Grieder: Referral, Self Treating Edris Schneck/Extender: Kathreen Cosier in Treatment: 0 Foot Assessment Items Site Locations + = Sensation present, - = Sensation absent, C = Callus, U = Ulcer R = Redness, W = Warmth, M = Maceration, PU = Pre-ulcerative lesion F = Fissure, S = Swelling, D = Dryness Assessment Right: Left: Other Deformity: No No Prior Foot Ulcer: No No Prior  Amputation: No No Charcot Joint: No No Ambulatory Status: Ambulatory Without Help Gait: Steady Electronic Signature(s) Signed: 05/11/2017 4:10:15 PM By: Curtis Sites Entered By: Curtis Sites on 05/11/2017 12:53:59 Molinari, Maggy R. ([id number]) -------------------------------------------------------------------------------- Nutrition Risk Assessment Details Patient Name: Kovacik, Dorota R. Date of Service: 05/11/2017 12:30 PM Medical Record Number: [id number] Patient Account Number: [id number] Date of Birth/Sex: 1946/03/01 (72 y.o. Female) Treating RN: Curtis Sites Primary Care Jerelle Virden: [name] Other Clinician: Referring Jakarie Pember: Referral, Self Treating Aeriel Boulay/Extender: Kathreen Cosier in Treatment: 0 Height (in): Weight (lbs): Body Mass Index (BMI): Nutrition Risk Assessment Items NUTRITION RISK SCREEN: I have an illness or condition that made me change the kind and/or amount of 0 No food I eat I eat fewer than two meals per day 0 No I eat few fruits and vegetables, or milk products 0 No I have three or more drinks of beer, liquor or wine almost every day 0 No I have tooth or mouth problems that make it hard for me to eat 0 No I don't always have enough money to buy the food I need 0 No I eat alone most of the time 0 No I take three or more different prescribed or over-the-counter drugs a day 1 Yes Without wanting to, I have lost or gained 10 pounds in the last six months 0 No I am not always physically able to shop, cook and/or feed myself 0 No Nutrition Protocols Good Risk Protocol 0 No interventions needed Moderate Risk Protocol Electronic Signature(s) Signed: 05/11/2017 11:42:21 AM By: Curtis Sites Entered By: Curtis Sites on 05/11/2017 11:42:20

## 2017-05-18 ENCOUNTER — Ambulatory Visit: Payer: Medicare HMO | Admitting: Nurse Practitioner

## 2017-05-19 NOTE — Progress Notes (Signed)
ZHARA, GIESKE ([id number]) Visit Report for 05/11/2017 Chief Complaint Document Details Patient Name: [name], [name]. Date of Service: 05/11/2017 12:30 PM Medical Record Number: [id number] Patient Account Number: [id number] Date of Birth/Sex: 1945/03/20 (72 y.o. Female) Treating RN: Ahmed Prima Primary Care Provider: Dion Body Other Clinician: Referring Provider: Referral, Self Treating Provider/Extender: Cathie Olden in Treatment: 0 Information Obtained from: Patient Chief Complaint She is here for evaluation of a left heel ulcer Electronic Signature(s) Signed: 05/11/2017 1:23:59 PM By: Lawanda Cousins Entered By: Lawanda Cousins on 05/11/2017 13:23:59 Parr, [place] ([id number]) -------------------------------------------------------------------------------- HPI Details Patient Name: Kristy Bible R. Date of Service: 05/11/2017 12:30 PM Medical Record Number: [id number] Patient Account Number: [id number] Date of Birth/Sex: [birth year] (72 y.o. Female) Treating RN: Carolyne Fiscal, Debi Primary Care Provider: Dion Body Other Clinician: Referring Provider: Referral, Self Treating Provider/Extender: Cathie Olden in Treatment: 0 History of Present Illness Location: left heel Quality: sharp, shooting pains Severity: pain rates 5-10/10 Duration: has been present for "weeks" Timing: pain is intermittent Context: dry, cracked heels Modifying Factors: factors that delay healing: edema, arterial insufficiency, diabetes HPI Description: 05/11/17 - She arrives for evaluation of a left heel ulcer. Electronic Signature(s) Signed: 05/11/2017 1:25:59 PM By: Lawanda Cousins Entered By: Lawanda Cousins on 05/11/2017 13:25:59 Stieber, [place] Lake[place] Kitchen ([id number]) -------------------------------------------------------------------------------- Physical Exam Details Patient Name: Lofstrom, Melisia R. Date of Service: 05/11/2017 12:30 PM Medical Record Number: [id number] Patient  Account Number: [id number] Date of Birth/Sex: 21-Aug-1945 (72 y.o. Female) Treating RN: Ahmed Prima Primary Care Provider: Dion Body Other Clinician: Referring Provider: Referral, Self Treating Provider/Extender: Cathie Olden in Treatment: 0 Respiratory non-labored. clear/diminished to auscultation. Cardiovascular s1 s2 regular, rate controlled. LLE- pitting edema-"improved", warm to touch, petechiae to foot and leg. Musculoskeletal . Psychiatric does appear to have good insight or judgement to medical needs. oriented x4. calm, cooperative. Electronic Signature(s) Signed: 05/11/2017 1:43:42 PM By: Lawanda Cousins Entered By: Lawanda Cousins on 05/11/2017 13:43:42 Hymes, [place] Kitchen ([id number]) -------------------------------------------------------------------------------- Physician Orders Details Patient Name: Kristy Bible R. Date of Service: 05/11/2017 12:30 PM Medical Record Number: [id number] Patient Account Number: [id number] Date of Birth/Sex: 1945/10/20 (72 y.o. Female) Treating RN: Ahmed Prima Primary Care Provider: Dion Body Other Clinician: Referring Provider: Referral, Self Treating Provider/Extender: Cathie Olden in Treatment: 0 Verbal / Phone Orders: Yes Clinician: Carolyne Fiscal, Debi Read Back and Verified: Yes Diagnosis Coding Wound Cleansing Wound #1 Left Calcaneus o Clean wound with Normal Saline. o Cleanse wound with mild soap and water o May Shower, gently pat wound dry prior to applying new dressing. Anesthetic (add to Medication List) Wound #1 Left Calcaneus o Topical Lidocaine 4% cream applied to wound bed prior to debridement (In Clinic Only). Skin Barriers/Peri-Wound Care Wound #1 Left Calcaneus o Skin Prep Primary Wound Dressing Wound #1 Left Calcaneus o Hydrogel o Prisma Ag Secondary Dressing Wound #1 Left Calcaneus o Boardered Foam Dressing - or telfa island o Foam - if you have to use telfa  island Dressing Change Frequency Wound #1 Left Calcaneus o Change dressing every other day. Follow-up Appointments Wound #1 Left Calcaneus o Return Appointment in 1 week. Edema Control Wound #1 Left Calcaneus o Elevate legs to the level of the heart and pump ankles as often as possible Off-Loading Wound #1 Left Calcaneus o Other: - float heels while in the bed Niederer, Kolby R. ([id number]) Additional Orders / Instructions Wound #1 Left Calcaneus o Increase protein intake. o Other: - coverlet over the blisters over the metatarsal head first Patient Medications Allergies: aspirin, atorvastatin,  bee venom protein (honey bee), codeine, cortisone, Demerol, meperidine, metformin, prednisone, lisinopril, Statins-Hmg-Coa Reductase Inhibitors, Tylenol Notifications Medication Indication Start End lidocaine DOSE 1 - topical 4 % cream - 1 cream topical Electronic Signature(s) Signed: 05/12/2017 5:53:16 PM By: Alric Quan Signed: 05/18/2017 1:53:22 PM By: Lawanda Cousins Entered By: Alric Quan on 05/12/2017 17:53:02 Dodson, [place]. ([id number]) -------------------------------------------------------------------------------- Prescription 05/11/2017 Patient Name: [name] Provider: Lawanda Cousins NP Date of Birth: 05-31-1945 NPI#: [id number] Sex: F DEA#: [id number] Phone #: [phone] License #: Patient Address: [facility] [facility] [place], [place] [address] [address] [place], [place] [address] [phone] Allergies aspirin atorvastatin bee venom protein (honey bee) codeine cortisone Demerol meperidine metformin prednisone lisinopril Statins-Hmg-Coa Reductase Inhibitors Tylenol Medication Medication: Route: Strength: Form: lidocaine 4 % topical cream topical 4% cream Class: TOPICAL LOCAL ANESTHETICS Indication: Brents, Ty R.  ([id number]) Dose: Frequency / Time: 1 1 cream topical Number of Refills: Number of Units: 0 Generic Substitution: Start Date: End Date: One Time Use: Substitution Permitted No Note to Pharmacy: Signature(s): Date(s): Electronic Signature(s) Signed: 05/12/2017 5:53:16 PM By: Alric Quan Signed: 05/18/2017 1:53:22 PM By: Lawanda Cousins Entered By: Alric Quan on 05/12/2017 17:53:02 Blickenstaff, Akaya R. ([id number]) --------------------------------------------------------------------------------  Problem List Details Patient Name: Vaughan, Saxon R. Date of Service: 05/11/2017 12:30 PM Medical Record Number: [id number] Patient Account Number: [id number] Date of Birth/Sex: September 09, 1945 (72 y.o. 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 ([id number]) -------------------------------------------------------------------------------- Progress Note Details Patient Name: Rezabek, Creasie R. Date of Service: 05/11/2017 12:30 PM Medical Record Number: [id number] Patient Account Number: [id number] Date of Birth/Sex: February 09, 1946 (72 y.o. Female) Treating RN: Carolyne Fiscal, Debi Primary Care Provider: Dion Body Other Clinician: Referring Provider: Referral, Self Treating  Provider/Extender: Cathie Olden in Treatment: 0 Subjective Chief Complaint Information obtained from Patient She is here for evaluation of a left heel ulcer History of Present Illness (HPI) The following HPI elements were documented for the patient's wound: Location: left heel Quality: sharp, shooting pains Severity: pain rates 5-10/10 Duration: has been present for "weeks" Timing: pain is intermittent Context: dry, cracked heels Modifying Factors: factors that delay healing: edema, arterial insufficiency, diabetes 05/11/17 - She arrives for evaluation of a left heel ulcer. Wound History Patient presents with 1 open wound that has been present for approximately 2 weeks. Patient has been treating wound in the following manner: gold bond diabetic lotion. Laboratory tests have not been performed in the last month. Patient reportedly has not tested positive for an antibiotic resistant organism. Patient reportedly has not tested positive for osteomyelitis. Patient reportedly has had testing performed to evaluate circulation in the legs. Patient History Information obtained from Patient. Allergies aspirin, atorvastatin, bee venom protein (honey bee), codeine, cortisone, Demerol, meperidine, metformin, prednisone, lisinopril, Statins-Hmg-Coa Reductase Inhibitors, Tylenol Family History Cancer - Mother, Heart Disease - Father,Siblings, Hypertension - Father,Siblings, Stroke - Mother, No family history of Diabetes, Hereditary Spherocytosis, Kidney Disease,  Lung Disease, Seizures, Thyroid Problems, Tuberculosis. Social History Never smoker, Marital Status - Widowed, Alcohol Use - Rarely, Drug Use - No History, Caffeine Use - Daily. Medical History Cardiovascular Patient has history of Angina - chronic stable, Congestive Heart Failure, Coronary Artery Disease, Deep Vein Thrombosis, Peripheral Venous Disease Denies history of Arrhythmia, Hypertension, Hypotension, Myocardial Infarction,  Peripheral Arterial Disease, Phlebitis, Vasculitis Gastrointestinal Stalker, Scarlettrose R. ([id number]) Denies history of Cirrhosis , Colitis, Crohn s, Hepatitis A, Hepatitis B, Hepatitis C Endocrine Patient has history of Type II Diabetes Immunological Denies history of Lupus Erythematosus, Raynaud s, Scleroderma Integumentary (Skin) Denies history of History of Burn, History of pressure wounds Musculoskeletal Patient has history of Osteoarthritis Neurologic Patient has history of Neuropathy Denies history of Dementia Oncologic Denies history of Received Chemotherapy, Received Radiation Patient is treated with Insulin. Medical And Surgical History Notes Cardiovascular HLD Review of Systems (ROS) Constitutional Symptoms (General Health) The patient has no complaints or symptoms. Eyes The patient has no complaints or symptoms. Ear/Nose/Mouth/Throat The patient has no complaints or symptoms. Hematologic/Lymphatic The patient has no complaints or symptoms. Respiratory The patient has no complaints or symptoms. Gastrointestinal The patient has no complaints or symptoms. Genitourinary The patient has no complaints or symptoms. Immunological The patient has no complaints or symptoms. Integumentary (Skin) The patient has no complaints or symptoms. Neurologic The patient has no complaints or symptoms. Oncologic The patient has no complaints or symptoms. Psychiatric The patient has no complaints or symptoms. Objective Constitutional Vitals Time Taken: 12:43 PM, Height: 65 in, Source: Measured, Temperature: 98.0 F, Pulse: 79 bpm, Respiratory Rate: 16 Gilani, Lakiyah R. ([id number]) breaths/min, Blood Pressure: 142/48 mmHg. Respiratory non-labored. clear/diminished to auscultation. Cardiovascular s1 s2 regular, rate controlled. LLE- pitting edema-"improved", warm to touch, petechiae to foot and leg. Psychiatric does appear to have good insight or judgement to medical needs.  oriented x4. calm, cooperative. Integumentary (Hair, Skin) Wound #1 status is Open. Original cause of wound was Gradually Appeared. The wound is located on the Left Calcaneus. The wound measures 1cm length x 0.4cm width x 0.2cm depth; 0.314cm^2 area and 0.063cm^3 volume. There is Fat Layer (Subcutaneous Tissue) Exposed exposed. There is no tunneling or undermining noted. There is a medium amount of serous drainage noted. The wound margin is flat and intact. There is large (67-100%) pink granulation within the wound bed. There is no necrotic tissue within the wound bed. The periwound skin appearance exhibited: Erythema. The periwound skin appearance did not exhibit: Callus, Crepitus, Excoriation, Induration, Rash, Scarring, Dry/Scaly, Maceration, Atrophie Blanche, Cyanosis, Ecchymosis, Hemosiderin Staining, Mottled, Pallor, Rubor. The surrounding wound skin color is noted with erythema which is circumferential. Periwound temperature was noted as Hot. The periwound has tenderness on palpation. 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 Cleanse wound with mild soap and water May Shower, gently pat wound dry prior to applying new dressing. Anesthetic (add to Medication List): Wound #1 Left Calcaneus: Topical Lidocaine 4% cream applied to wound bed prior to debridement (In Clinic Only). Skin Barriers/Peri-Wound Care: Wound #1 Left Calcaneus: Skin Prep Primary Wound Dressing: Wound #1 Left Calcaneus: Barbaro, Kerrilynn R. ([id number]) Hydrogel Prisma Ag Secondary Dressing: Wound #1 Left Calcaneus: Boardered Foam Dressing Dressing Change  Frequency: Wound #  1 Left Calcaneus: Change dressing every other day. Follow-up Appointments: Wound #1 Left Calcaneus: Return Appointment in 1 week. Edema Control: Wound #1 Left Calcaneus: Elevate legs to the level of the heart and pump ankles as often as possible Off-Loading: Wound #1 Left Calcaneus: Other: - float heels while in the bed Additional Orders / Instructions: Wound #1 Left Calcaneus: Increase protein intake. Other: - coverlet over the blisters over the metatarsal head first The following medication(s) was prescribed: lidocaine topical 4 % cream 1 1 cream topical was prescribed at facility 1. Prisma and foam border to left heel tiw 2. Large Band-Aid over small blisters to plantar left first met head 3. Follow-up next week Electronic Signature(s) Signed: 05/11/2017 2:08:07 PM By: Lawanda Cousins Entered By: Lawanda Cousins on 05/11/2017 14:08:07 Walgren, [place]. ([id number]) -------------------------------------------------------------------------------- ROS/PFSH Details Patient Name: Kristy Bible R. Date of Service: 05/11/2017 12:30 PM Medical Record Number: [id number] Patient Account Number: [id number] Date of Birth/Sex: 08/06/1945 (72 y.o. Female) Treating RN: Montey Hora Primary Care Provider: Dion Body Other Clinician: Referring Provider: Referral, Self Treating Provider/Extender: Cathie Olden in Treatment: 0 Information Obtained From Patient Wound History Do you currently have one or more open woundso Yes How many open wounds do you currently haveo 1 Approximately how long have you had your woundso 2 weeks How have you been treating your wound(s) until nowo gold bond diabetic lotion Has your wound(s) ever healed and then re-openedo No Have you had any lab work done in the past montho No Have you tested positive for an antibiotic resistant organism (MRSA, VRE)o No Have you tested positive for osteomyelitis (bone infection)o No Have you had any  tests for circulation on your legso Yes Who ordered the testo PCP Where was the test doneo [facility] Constitutional Symptoms (General Health) Complaints and Symptoms: No Complaints or Symptoms Eyes Complaints and Symptoms: No Complaints or Symptoms Ear/Nose/Mouth/Throat Complaints and Symptoms: No Complaints or Symptoms Hematologic/Lymphatic Complaints and Symptoms: No Complaints or Symptoms Respiratory Complaints and Symptoms: No Complaints or Symptoms Cardiovascular Medical History: Positive for: Angina - chronic stable; Congestive Heart Failure; Coronary Artery Disease; Deep Vein Thrombosis; Peripheral Venous Disease Negative for: Arrhythmia; Hypertension; Hypotension; Myocardial Infarction; Peripheral Arterial Disease; Phlebitis; Vasculitis Past Medical History Notes: HLD Karas, Dianara R. ([id number]) Gastrointestinal Complaints and Symptoms: No Complaints or Symptoms Medical History: Negative for: Cirrhosis ; Colitis; Crohnos; Hepatitis A; Hepatitis B; Hepatitis C Endocrine Medical History: Positive for: Type II Diabetes Treated with: Insulin Genitourinary Complaints and Symptoms: No Complaints or Symptoms Immunological Complaints and Symptoms: No Complaints or Symptoms Medical History: Negative for: Lupus Erythematosus; Raynaudos; Scleroderma Integumentary (Skin) Complaints and Symptoms: No Complaints or Symptoms Medical History: Negative for: History of Burn; History of pressure wounds Musculoskeletal Medical History: Positive for: Osteoarthritis Neurologic Complaints and Symptoms: No Complaints or Symptoms Medical History: Positive for: Neuropathy Negative for: Dementia Oncologic Complaints and Symptoms: No Complaints or Symptoms Medical History: Negative for: Received Chemotherapy; Received Radiation Psychiatric MURIAL, BEAM. ([id number]) Complaints and Symptoms: No Complaints or Symptoms Immunizations Pneumococcal Vaccine: Received  Pneumococcal Vaccination: Yes Immunization Notes: up to date Implantable Devices Family and Social History Cancer: Yes - Mother; Diabetes: No; Heart Disease: Yes - Father,Siblings; Hereditary Spherocytosis: No; Hypertension: Yes - Father,Siblings; Kidney Disease: No; Lung Disease: No; Seizures: No; Stroke: Yes - Mother; Thyroid Problems: No; Tuberculosis: No; Never smoker; Marital Status - Widowed; Alcohol Use: Rarely; Drug Use: No History; Caffeine Use: Daily; Financial Concerns: No; Food, Clothing or Shelter Needs: No; Support System Lacking: No; Transportation Concerns:  No; Advanced Directives: No; Patient does not want information on Advanced Directives Electronic Signature(s) Signed: 05/11/2017 4:10:15 PM By: Montey Hora Signed: 05/18/2017 1:53:22 PM By: Lawanda Cousins Entered By: Montey Hora on 05/11/2017 12:47:29 Lamarque, LeRoy ([id number]) -------------------------------------------------------------------------------- SuperBill Details Patient Name: Aschoff, Indi R. Date of Service: 05/11/2017 Medical Record Number: [id number] Patient Account Number: [id number] Date of Birth/Sex: 1945-08-12 (72 y.o. 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 S90.822S Blister (nonthermal), left foot, sequela E11.621 Type 2 diabetes mellitus with foot ulcer Facility Procedures CPT4 Code: [id number] Description: 99213 - WOUND CARE VISIT-LEV 3 EST PT Modifier: Quantity: 1 Physician Procedures CPT4 Code Description: 2409735 32992 - WC PHYS LEVEL 3 - EST PT ICD-10 Diagnosis Description I70.202 Unspecified atherosclerosis of native  arteries of extremitie 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

## 2017-06-12 ENCOUNTER — Other Ambulatory Visit (INDEPENDENT_AMBULATORY_CARE_PROVIDER_SITE_OTHER): Payer: Self-pay | Admitting: Vascular Surgery

## 2017-06-12 DIAGNOSIS — I739 Peripheral vascular disease, unspecified: Secondary | ICD-10-CM

## 2017-06-13 ENCOUNTER — Encounter (INDEPENDENT_AMBULATORY_CARE_PROVIDER_SITE_OTHER): Payer: Self-pay | Admitting: Vascular Surgery

## 2017-06-13 ENCOUNTER — Ambulatory Visit (INDEPENDENT_AMBULATORY_CARE_PROVIDER_SITE_OTHER): Payer: Medicare HMO | Admitting: Vascular Surgery

## 2017-06-13 ENCOUNTER — Ambulatory Visit (INDEPENDENT_AMBULATORY_CARE_PROVIDER_SITE_OTHER): Payer: Medicare HMO

## 2017-06-13 VITALS — BP 156/82 | HR 70 | Resp 16 | Ht 65.0 in | Wt 218.0 lb

## 2017-06-13 DIAGNOSIS — E1159 Type 2 diabetes mellitus with other circulatory complications: Secondary | ICD-10-CM | POA: Diagnosis not present

## 2017-06-13 DIAGNOSIS — I739 Peripheral vascular disease, unspecified: Secondary | ICD-10-CM | POA: Diagnosis not present

## 2017-06-13 DIAGNOSIS — Z794 Long term (current) use of insulin: Secondary | ICD-10-CM

## 2017-06-13 DIAGNOSIS — I10 Essential (primary) hypertension: Secondary | ICD-10-CM

## 2017-06-13 DIAGNOSIS — I70222 Atherosclerosis of native arteries of extremities with rest pain, left leg: Secondary | ICD-10-CM | POA: Diagnosis not present

## 2017-06-13 NOTE — Progress Notes (Signed)
MRN : [id number]  [name] is a 72 y.o. (12/01/1945) female who presents with chief complaint of  Chief Complaint  Patient presents with  . Follow-up    3 week [facility] ABI f/u  .  History of Present Illness: Patient returns today in follow up of peripheral arterial disease.  She underwent extensive revascularization of the left lower extremity several weeks ago.  She had significant reperfusion pain after the procedure that took several days to abate.  Her leg is now feeling as good as new.  She went to the wound clinic for some small ulcers on her feet which have healed. Her ABIs today are 0.85 on the right and 1.07 on the left with brisk normal triphasic waveforms on the left.  Current Outpatient Medications  Medication Sig Dispense Refill  . clopidogrel (PLAVIX) 75 MG tablet Take 1 tablet (75 mg total) by mouth daily. 30 tablet 11  . furosemide (LASIX) 20 MG tablet Take 1 tablet (20 mg total) by mouth daily as needed for fluid. 30 tablet 2  . insulin NPH-regular Human (NOVOLIN 70/30) (70-30) 100 UNIT/ML injection Inject 30 Units into the skin 2 (two) times daily with a meal. (Patient taking differently: Inject 40 Units into the skin 2 (two) times daily with a meal. ) 10 mL 11  . NITROSTAT 0.4 MG SL tablet PLACE 1 TABLET UNDER TONGUE EVERY 5 MIN AS NEEDED FOR CHEST PAIN IF NO RELIEF IN15 MIN CALL 911 (MAX 3 TABS) (Patient taking differently: PLACE 0.4 MG UNDER TONGUE EVERY 5 MIN AS NEEDED FOR CHEST PAIN IF NO RELIEF IN15 MIN CALL 911 (MAX 3 TABS)) 100 tablet 0   No current facility-administered medications for this visit.     Past Medical History:  Diagnosis Date  . Arthritis    "hands, back" (12/09/2013)  . Aspirin allergy   . CAD (coronary artery disease)    a. 03/2010 s/p CABG x 3 (LIMA->LAD, VG->OM, VG->Diag);  b. 9/15 PCI native LCX (2.75x16 & 2.75x8 Promus DES');  c. 02/2014 Cath: LM nl, LAD 80-90/100p, LCX patent stents, OM1 100, OM2 99ost, RCA 100p, VG->Diag 100, VG->OM  nl, LIMA->LAD nl, EF 55-65%-->Med Rx; d. 10/2015 NSTEMI/Cath: stable anatomy, patent LCX stent, 2/3 patent grafts as prev noted, EF 45%-->Med Rx w/ nitrate/ranexa.  . Carotid arterial disease ([place])    a. 08/2015 U/S: RICA 54-98%, LICA 2-64%-->B/R 1 yr.  . Chronic stable angina ([place])   . Diastolic dysfunction    a. 01/2014 Echo: EF 55%, Gr1 DD, triv MR.  . DVT (deep venous thrombosis) (HCC) 1970's   LLE  . HLD (hyperlipidemia)    statin intolerant  . Hypertensive heart disease   . Obesity   . Orthostatic hypotension    previous Midodrine therapy  . Pneumonia 2013?  Marland Kitchen PVD (peripheral vascular disease) ([place])    a. prior balloon PTA to left leg in [place].  . Type II diabetes mellitus ([place])     Past Surgical History:  Procedure Laterality Date  . ANGIOPLASTY / STENTING FEMORAL Left 03/14/2011  . APPENDECTOMY    . CARDIAC CATHETERIZATION  03/2010; 2013  . CARDIAC CATHETERIZATION N/A 11/05/2015   Procedure: LEFT HEART CATH AND CORS/GRAFTS ANGIOGRAPHY;  Surgeon: Minna Merritts, MD;  Location: [place] [facility];  Service: Cardiovascular;  Laterality: N/A;  . Carotid dopplers  03/2010   40-59% bilateral ICA stenosis  . CATARACT EXTRACTION W/ INTRAOCULAR LENS  IMPLANT, BILATERAL Bilateral   . CHOLECYSTECTOMY  2012  .  CORONARY ANGIOPLASTY WITH STENT PLACEMENT  12/09/2013   circumflex was stented 2.75 x 16 overlapping 2.75 x 8 promus drug-eluting stents  . CORONARY ARTERY BYPASS GRAFT  03/2010   CABG X3  . LEFT HEART CATHETERIZATION WITH CORONARY/GRAFT ANGIOGRAM N/A 11/21/2011   Procedure: LEFT HEART CATHETERIZATION WITH Beatrix Fetters;  Surgeon: Burnell Blanks, MD;  Location[facility] CATH LAB;  Service: Cardiovascular;  Laterality: N/A;  . LEFT HEART CATHETERIZATION WITH CORONARY/GRAFT ANGIOGRAM N/A 12/09/2013   Procedure: LEFT HEART CATHETERIZATION WITH Beatrix Fetters;  Surgeon: Jettie Booze, MD;  Location: [facility] CATH LAB;  Service: Cardiovascular;  Laterality:  N/A;  . LEFT HEART CATHETERIZATION WITH CORONARY/GRAFT ANGIOGRAM N/A 03/13/2014   Procedure: LEFT HEART CATHETERIZATION WITH Beatrix Fetters;  Surgeon: Peter M Martinique, MD;  Location: [facility] CATH LAB;  Service: Cardiovascular;  Laterality: N/A;  . LOWER EXTREMITY ANGIOGRAPHY Left 05/04/2017   Procedure: LOWER EXTREMITY ANGIOGRAPHY;  Surgeon: Algernon Huxley, MD;  Location: [place] CV LAB;  Service: Cardiovascular;  Laterality: Left;  . PERCUTANEOUS CORONARY STENT INTERVENTION (PCI-S)  12/09/2013   Procedure: PERCUTANEOUS CORONARY STENT INTERVENTION (PCI-S);  Surgeon: Jettie Booze, MD;  Location: [facility] CATH LAB;  Service: Cardiovascular;;  . REFRACTIVE SURGERY     laser  . RIGHT HEART CATHETERIZATION  11/21/2011   Procedure: RIGHT HEART CATH;  Surgeon: Burnell Blanks, MD;  Location: [facility] CATH LAB;  Service: Cardiovascular;;  . TONSILLECTOMY    . TUBAL LIGATION Bilateral   . VITRECTOMY Right 2015    Social History Social History       Tobacco Use  . Smoking status: Never Smoker  . Smokeless tobacco: Never Used  Substance Use Topics  . Alcohol use: No  . Drug use: No    Family History      Family History  Problem Relation Age of Onset  . Esophageal cancer Mother   . Stroke Mother   . Heart attack Father        MI  . Heart attack Brother        CABG x 4   . Heart disease Brother        valve replacement    Allergies  Allergen Reactions  . Bee Venom Anaphylaxis  . Statins Other (See Comments)    Crippling  . Lisinopril Other (See Comments)    Fluctuations BP  . Tylenol [Acetaminophen] Other (See Comments)    Unknown  . Aspirin Hives  . Atorvastatin Other (See Comments)    Arthralgias  . Codeine Hives and Nausea Only  . Cortisone Hives  . Demerol Hives  . Meperidine Hives and Nausea And Vomiting  . Metformin Other (See Comments)    Intolerance  . Prednisone Hives     REVIEW OF SYSTEMS (Negative unless  checked)  Constitutional: _0 Weight loss  _1 Fever  _2 Chills Cardiac: _3 Chest pain   _4 Chest pressure   _5 Palpitations   _6 Shortness of breath when laying flat   _7 Shortness of breath at rest   _8 Shortness of breath with exertion. Vascular:  _9 Pain in legs with walking   _10 Pain in legs at rest   _11 Pain in legs when laying flat   _12 Claudication   _13 Pain in feet when walking  _14 Pain in feet at rest  _15 Pain in feet when laying flat   _16 History of DVT   _17 Phlebitis   _18 Swelling in legs   _19 Varicose veins   _20 Non-healing ulcers Pulmonary:   _21 Uses home oxygen   _22 Productive cough   _23 Hemoptysis   _24 Wheeze  _25 COPD   _26   Asthma Neurologic:  _0 Dizziness  _1 Blackouts   _2 Seizures   _3 History of stroke   _4 History of TIA  _5 Aphasia   _6 Temporary blindness   _7 Dysphagia   _8 Weakness or numbness in arms   _9 Weakness or numbness in legs Musculoskeletal:  _10 Arthritis   _11 Joint swelling   _12 Joint pain   _13 Low back pain Hematologic:  _14 Easy bruising  _15 Easy bleeding   _16 Hypercoagulable state   _17 Anemic   Gastrointestinal:  _18 Blood in stool   _19 Vomiting blood  _20 Gastroesophageal reflux/heartburn   _21 Abdominal pain Genitourinary:  _22 Chronic kidney disease   _23 Difficult urination  _24 Frequent urination  _25 Burning with urination   _26 Hematuria Skin:  _27 Rashes   _28 Ulcers   _29 Wounds Psychological:  _30 History of anxiety   _31  History of major depression.     Physical Examination  BP (!) 156/82 (BP Location: Right Arm, Patient Position: Sitting)   Pulse 70   Resp 16   Ht _32  (1.651 m)   Wt 98.9 kg (218 lb)   BMI 36.28 kg/m  Gen:  WD/WN, NAD Head: [place]/AT, No temporalis wasting. Ear/Nose/Throat: Hearing grossly intact, nares w/o erythema or drainage, trachea midline Eyes: Conjunctiva clear. Sclera non-icteric Neck: Supple.  No JVD.  Pulmonary:  Good air movement, no use of accessory muscles.  Cardiac: RRR, no JVD Vascular:  Vessel Right Left  Radial Palpable Palpable                          PT  1+  palpable Palpable  DP Palpable Palpable   Musculoskeletal: M/S 5/5 throughout.  No deformity or atrophy.  Trace left lower extremity edema. Neurologic: Sensation grossly intact in extremities.  Symmetrical.  Speech is fluent.  Psychiatric: Judgment intact, Mood & affect appropriate for pt's clinical situation. Dermatologic: No rashes or ulcers noted.  No cellulitis or open wounds.       Labs Recent Results (from the past 2160 hour(s))  Glucose, capillary     Status: Abnormal   Collection Time: 05/04/17  8:52 AM  Result Value Ref Range   Glucose-Capillary 107 (H) 65 - 99 mg/dL  BUN     Status: None   Collection Time: 05/04/17  9:10 AM  Result Value Ref Range   BUN 18 6 - 20 mg/dL    Comment: Performed at [facility], [address] [place], [place] [address]  Creatinine, serum     Status: None   Collection Time: 05/04/17  9:10 AM  Result Value Ref Range   Creatinine, Ser 0.77 0.44 - 1.00 mg/dL   GFR calc non Af Amer >60 >60 mL/min   GFR calc Af Amer >60 >60 mL/min    Comment: (NOTE) The eGFR has been calculated using the CKD EPI equation. This calculation has not been validated in all clinical situations. eGFR's persistently <60 mL/min signify possible Chronic Kidney Disease. Performed at [facility], [address] [place], [place] [address]   Glucose, capillary     Status: None   Collection Time: 05/04/17 12:07 PM  Result Value Ref Range   Glucose-Capillary 96 65 - 99 mg/dL  Glucose, capillary     Status: Abnormal   Collection Time: 05/04/17 12:37 PM  Result Value Ref Range   Glucose-Capillary 106 (H) 65 - 99 mg/dL  Glucose, capillary     Status: Abnormal   Collection Time: 05/04/17  2:01 PM  Result Value Ref Range   Glucose-Capillary 110 (H) 65 - 99 mg/dL  Glucose, capillary  Status: Abnormal   Collection Time: 05/04/17  2:53 PM  Result Value Ref Range   Glucose-Capillary 147 (H) 65 - 99 mg/dL    Radiology No results  found.    Assessment/Plan Type II diabetes mellitus (HCC) blood glucose control important in reducing the progression of atherosclerotic disease. Also, involved in wound healing. On appropriate medications.   Essential hypertension blood pressure control important in reducing the progression of atherosclerotic disease. On appropriate oral medications.   Atherosclerosis of native arteries of extremity with rest pain ([place]) Her ABIs today are 0.85 on the right and 1.07 on the left with brisk normal triphasic waveforms on the left. She is doing quite well.  Continue Plavix 75 mg daily.  Return in 3-4 months with noninvasive studies.    Leotis Pain, MD  06/13/2017 3:45 PM    This note was created with Dragon medical transcription system.  Any errors from dictation are purely unintentional

## 2017-06-13 NOTE — Assessment & Plan Note (Signed)
Her ABIs today are 0.85 on the right and 1.07 on the left with brisk normal triphasic waveforms on the left. She is doing quite well.  Continue Plavix 75 mg daily.  Return in 3-4 months with noninvasive studies.

## 2017-07-12 ENCOUNTER — Encounter: Payer: Self-pay | Admitting: *Deleted

## 2017-07-12 DIAGNOSIS — Z006 Encounter for examination for normal comparison and control in clinical research program: Secondary | ICD-10-CM

## 2017-07-12 NOTE — Progress Notes (Addendum)
Late entry: Subject to research clinic on July 07, 2017 for visit T8-M18 in the Clear research study.  No cos, saes to report. An AE did occur and is reported to sponsor.  New drug dispensed and next appointment scheduled.  Subject re-consented to [facility] [place] version 6.0, local 10Jan2019

## 2017-08-08 ENCOUNTER — Telehealth: Payer: Self-pay | Admitting: *Deleted

## 2017-08-08 NOTE — Telephone Encounter (Signed)
[name] called to ask if a sore neck could be a side effect of the Clear Research drug.  She spent the night in a hotel about 2 weeks ago and developed a sore neck.  She has had massage therapy but the pain doesn't completely resolve.  She was asking if this could be a side effect of bempedoic acid.  I told the subject that a sore neck was not listed as a side effect in the protocol or consent form.  She thanked me for returning her call.

## 2017-09-22 ENCOUNTER — Encounter (INDEPENDENT_AMBULATORY_CARE_PROVIDER_SITE_OTHER): Payer: Medicare HMO

## 2017-09-22 ENCOUNTER — Ambulatory Visit (INDEPENDENT_AMBULATORY_CARE_PROVIDER_SITE_OTHER): Payer: Medicare HMO | Admitting: Vascular Surgery

## 2017-10-13 ENCOUNTER — Encounter (INDEPENDENT_AMBULATORY_CARE_PROVIDER_SITE_OTHER): Payer: Medicare HMO

## 2017-10-13 ENCOUNTER — Ambulatory Visit (INDEPENDENT_AMBULATORY_CARE_PROVIDER_SITE_OTHER): Payer: Medicare HMO | Admitting: Vascular Surgery

## 2017-10-17 ENCOUNTER — Other Ambulatory Visit: Payer: Self-pay | Admitting: Cardiology

## 2017-10-17 DIAGNOSIS — I6523 Occlusion and stenosis of bilateral carotid arteries: Secondary | ICD-10-CM

## 2017-10-23 ENCOUNTER — Ambulatory Visit (INDEPENDENT_AMBULATORY_CARE_PROVIDER_SITE_OTHER): Payer: Medicare HMO

## 2017-10-23 DIAGNOSIS — I6523 Occlusion and stenosis of bilateral carotid arteries: Secondary | ICD-10-CM

## 2017-10-30 ENCOUNTER — Telehealth: Payer: Self-pay | Admitting: *Deleted

## 2017-10-30 DIAGNOSIS — I6523 Occlusion and stenosis of bilateral carotid arteries: Secondary | ICD-10-CM

## 2017-10-30 NOTE — Telephone Encounter (Signed)
Carotid doppler ordered for 1 year 

## 2017-10-30 NOTE — Telephone Encounter (Signed)
-----   Message from Rollene Rotunda, MD sent at 10/25/2017  7:56 AM EDT ----- Right Carotid: Velocities in the right ICA are consistent with a 40-59% stenosis.      Left Carotid: Velocities in the left ICA are consistent with a 40-59% stenosis. Follow up in one year with repeat Doppler. Call Ms. [place] with the results and send results to [name], MD

## 2017-11-01 ENCOUNTER — Ambulatory Visit: Payer: Medicare HMO | Admitting: Cardiology

## 2017-11-29 NOTE — Progress Notes (Signed)
HPI The patient presents for followup of coronary disease.  She has an extensive history of CAD.  She has been intolerant of many medications.  She took herself off of most meds.  Since I last saw her she has done well.  She did have thrombosis of the right leg and had to have popliteal artery stenting with thrombectomy in February.  She is followed by vascular surgery.  Her sugars been well controlled.  She continues in the CLEAR study for her lipids.  She is required very rare sublingual nitroglycerin and very rare diuretic.  Working part-time.  She is not having any chest pressure, neck or arm discomfort.  She has no shortness of breath, PND or orthopnea.  Allergies  Allergen Reactions  . Bee Venom Anaphylaxis  . Statins Other (See Comments)    Crippling  . Lisinopril Other (See Comments)    Fluctuations BP  . Tylenol [Acetaminophen] Other (See Comments)    Unknown  . Aspirin Hives  . Atorvastatin Other (See Comments)    Arthralgias  . Codeine Hives and Nausea Only  . Cortisone Hives  . Demerol Hives  . Meperidine Hives and Nausea And Vomiting  . Metformin Other (See Comments)    Intolerance  . Prednisone Hives    Current Outpatient Medications  Medication Sig Dispense Refill  . clopidogrel (PLAVIX) 75 MG tablet Take 1 tablet (75 mg total) by mouth daily. 30 tablet 11  . furosemide (LASIX) 20 MG tablet Take 1 tablet (20 mg total) by mouth daily as needed for fluid. 30 tablet 2  . insulin NPH-regular Human (NOVOLIN 70/30) (70-30) 100 UNIT/ML injection Inject into the skin. Inject 20 in the morning and 25 in the evening    . nitroGLYCERIN (NITROSTAT) 0.4 MG SL tablet PLACE 1 TABLET UNDER TONGUE EVERY 5 MIN AS NEEDED FOR CHEST PAIN IF NO RELIEF IN15 MIN CALL 911 (MAX 3 TABS) 25 tablet 2   No current facility-administered medications for this visit.     Past Medical History:  Diagnosis Date  . Arthritis    "hands, back" (12/09/2013)  . Aspirin allergy   . CAD (coronary  artery disease)    a. 03/2010 s/p CABG x 3 (LIMA->LAD, VG->OM, VG->Diag);  b. 9/15 PCI native LCX (2.75x16 & 2.75x8 Promus DES');  c. 02/2014 Cath: LM nl, LAD 80-90/100p, LCX patent stents, OM1 100, OM2 99ost, RCA 100p, VG->Diag 100, VG->OM nl, LIMA->LAD nl, EF 55-65%-->Med Rx; d. 10/2015 NSTEMI/Cath: stable anatomy, patent LCX stent, 2/3 patent grafts as prev noted, EF 45%-->Med Rx w/ nitrate/ranexa.  . Carotid arterial disease (HCC)    a. 08/2015 U/S: RICA 40-59%, LICA 1-39%-->f/u 1 yr.  . Chronic stable angina (HCC)   . Diastolic dysfunction    a. 01/2014 Echo: EF 55%, Gr1 DD, triv MR.  . DVT (deep venous thrombosis) (HCC) 1970's   LLE  . HLD (hyperlipidemia)    statin intolerant  . Hypertensive heart disease   . Obesity   . Orthostatic hypotension    previous Midodrine therapy  . Pneumonia 2013?  Marland Kitchen PVD (peripheral vascular disease) (HCC)    a. prior balloon PTA to left leg in [place].  . Type II diabetes mellitus (HCC)     Past Surgical History:  Procedure Laterality Date  . ANGIOPLASTY / STENTING FEMORAL Left 03/14/2011  . APPENDECTOMY    . CARDIAC CATHETERIZATION  03/2010; 2013  . CARDIAC CATHETERIZATION N/A 11/05/2015   Procedure: LEFT HEART CATH AND CORS/GRAFTS ANGIOGRAPHY;  Surgeon: Antonieta Iba, MD;  Location: [facility] INVASIVE CV LAB;  Service: Cardiovascular;  Laterality: N/A;  . Carotid dopplers  03/2010   40-59% bilateral ICA stenosis  . CATARACT EXTRACTION W/ INTRAOCULAR LENS  IMPLANT, BILATERAL Bilateral   . CHOLECYSTECTOMY  2012  . CORONARY ANGIOPLASTY WITH STENT PLACEMENT  12/09/2013   circumflex was stented 2.75 x 16 overlapping 2.75 x 8 promus drug-eluting stents  . CORONARY ARTERY BYPASS GRAFT  03/2010   CABG X3  . LEFT HEART CATHETERIZATION WITH CORONARY/GRAFT ANGIOGRAM N/A 11/21/2011   Procedure: LEFT HEART CATHETERIZATION WITH Isabel Caprice;  Surgeon: Kathleene Hazel, MD;  Location: [facility] CATH LAB;  Service: Cardiovascular;  Laterality: N/A;  .  LEFT HEART CATHETERIZATION WITH CORONARY/GRAFT ANGIOGRAM N/A 12/09/2013   Procedure: LEFT HEART CATHETERIZATION WITH Isabel Caprice;  Surgeon: Corky Crafts, MD;  Location: [facility] CATH LAB;  Service: Cardiovascular;  Laterality: N/A;  . LEFT HEART CATHETERIZATION WITH CORONARY/GRAFT ANGIOGRAM N/A 03/13/2014   Procedure: LEFT HEART CATHETERIZATION WITH Isabel Caprice;  Surgeon: Peter M Swaziland, MD;  Location: [facility] CATH LAB;  Service: Cardiovascular;  Laterality: N/A;  . LOWER EXTREMITY ANGIOGRAPHY Left 05/04/2017   Procedure: LOWER EXTREMITY ANGIOGRAPHY;  Surgeon: Annice Needy, MD;  Location: [facility] INVASIVE CV LAB;  Service: Cardiovascular;  Laterality: Left;  . PERCUTANEOUS CORONARY STENT INTERVENTION (PCI-S)  12/09/2013   Procedure: PERCUTANEOUS CORONARY STENT INTERVENTION (PCI-S);  Surgeon: Corky Crafts, MD;  Location: [facility] CATH LAB;  Service: Cardiovascular;;  . REFRACTIVE SURGERY     laser  . RIGHT HEART CATHETERIZATION  11/21/2011   Procedure: RIGHT HEART CATH;  Surgeon: Kathleene Hazel, MD;  Location: [facility] CATH LAB;  Service: Cardiovascular;;  . TONSILLECTOMY    . TUBAL LIGATION Bilateral   . VITRECTOMY Right 2015    ROS:  As stated in the HPI and negative for all other systems.  PHYSICAL EXAM BP 138/61   Pulse 83   Ht 5\' 5"  (1.651 m)   Wt 206 lb (93.4 kg)   BMI 34.28 kg/m   GENERAL:  Well appearing NECK:  No jugular venous distention, waveform within normal limits, carotid upstroke brisk and symmetric, no bruits, no thyromegaly LUNGS:  Clear to auscultation bilaterally CHEST:  Unremarkable HEART:  PMI not displaced or sustained,S1 and S2 within normal limits, no S3, no S4, no clicks, no rubs, no murmurs ABD:  Flat, positive bowel sounds normal in frequency in pitch, no bruits, no rebound, no guarding, no midline pulsatile mass, no hepatomegaly, no splenomegaly EXT:  2 plus pulses upper and decreased dorsalis pedis and posterior tibialis bilaterally,, mild  diffuse nonpitting edema, no cyanosis no clubbing   EKG: 05/04/2017 sinus rhythm, left axis deviation, left anterior fascicular block, borderline QT prolongation, no acute ST-T wave changes.   ASSESSMENT AND PLAN   CAD - The patient has no new sypmtoms.  No further cardiovascular testing is indicated.  We will continue with aggressive risk reduction and meds as listed.  CAROTID ARTERY STENOSIS, BILATERAL -  She had 40 - 59% bilateral stenosis in August.  I will continue meds as listed.  We will follow in one year.  HYPERLIPIDEMIA -  She is on the CLEAR study with bempedoic acid 180mg /day, a non-statin alternative, versus placebo.  No change in therapy.   HTN - Her BP is controlled. No change in therapy.   DM - She reports that the A1C is 7.5.  She is followed by endocrinology.

## 2017-11-30 ENCOUNTER — Ambulatory Visit: Payer: Medicare HMO | Admitting: Cardiology

## 2017-11-30 ENCOUNTER — Encounter: Payer: Self-pay | Admitting: Cardiology

## 2017-11-30 VITALS — BP 138/61 | HR 83 | Ht 65.0 in | Wt 206.0 lb

## 2017-11-30 DIAGNOSIS — I10 Essential (primary) hypertension: Secondary | ICD-10-CM

## 2017-11-30 DIAGNOSIS — I2510 Atherosclerotic heart disease of native coronary artery without angina pectoris: Secondary | ICD-10-CM

## 2017-11-30 DIAGNOSIS — E785 Hyperlipidemia, unspecified: Secondary | ICD-10-CM

## 2017-11-30 DIAGNOSIS — I6523 Occlusion and stenosis of bilateral carotid arteries: Secondary | ICD-10-CM | POA: Diagnosis not present

## 2017-11-30 MED ORDER — NITROGLYCERIN 0.4 MG SL SUBL: SUBLINGUAL_TABLET | SUBLINGUAL | 2 refills | Status: AC

## 2017-11-30 NOTE — Patient Instructions (Signed)

## 2018-01-02 ENCOUNTER — Encounter: Payer: Medicare HMO | Admitting: *Deleted

## 2018-01-02 VITALS — BP 134/71 | HR 85 | Wt 207.0 lb

## 2018-01-02 DIAGNOSIS — Z006 Encounter for examination for normal comparison and control in clinical research program: Secondary | ICD-10-CM

## 2018-01-02 NOTE — Research (Signed)
Patient to research clinic for visit T10-M24 in the Clear research study.  No cos, aes or saes to report.  99% compliant with meds and new drug dispensed.  Next phone call/followup scheduled and next clinic visit scheduled.

## 2018-01-16 ENCOUNTER — Encounter (INDEPENDENT_AMBULATORY_CARE_PROVIDER_SITE_OTHER): Payer: Medicare HMO

## 2018-01-16 ENCOUNTER — Ambulatory Visit (INDEPENDENT_AMBULATORY_CARE_PROVIDER_SITE_OTHER): Payer: Medicare HMO | Admitting: Vascular Surgery

## 2018-02-16 ENCOUNTER — Ambulatory Visit (INDEPENDENT_AMBULATORY_CARE_PROVIDER_SITE_OTHER): Payer: Medicare HMO

## 2018-02-16 ENCOUNTER — Ambulatory Visit (INDEPENDENT_AMBULATORY_CARE_PROVIDER_SITE_OTHER): Payer: Medicare HMO | Admitting: Vascular Surgery

## 2018-02-16 DIAGNOSIS — I70222 Atherosclerosis of native arteries of extremities with rest pain, left leg: Secondary | ICD-10-CM | POA: Diagnosis not present

## 2018-02-20 ENCOUNTER — Telehealth (INDEPENDENT_AMBULATORY_CARE_PROVIDER_SITE_OTHER): Payer: Self-pay | Admitting: Vascular Surgery

## 2018-02-20 NOTE — Telephone Encounter (Signed)
Patient had an appt on 12/13 and it was canceled. She needs come back in and see Dew for results of her U/S, so please call patient and reschedule.

## 2018-02-23 ENCOUNTER — Ambulatory Visit (INDEPENDENT_AMBULATORY_CARE_PROVIDER_SITE_OTHER): Payer: Medicare HMO | Admitting: Vascular Surgery

## 2018-03-02 ENCOUNTER — Encounter (INDEPENDENT_AMBULATORY_CARE_PROVIDER_SITE_OTHER): Payer: Self-pay | Admitting: Vascular Surgery

## 2018-03-02 ENCOUNTER — Ambulatory Visit (INDEPENDENT_AMBULATORY_CARE_PROVIDER_SITE_OTHER): Payer: Medicare HMO | Admitting: Vascular Surgery

## 2018-03-02 VITALS — BP 133/71 | HR 81 | Resp 16 | Ht 64.0 in | Wt 204.0 lb

## 2018-03-02 DIAGNOSIS — I10 Essential (primary) hypertension: Secondary | ICD-10-CM | POA: Diagnosis not present

## 2018-03-02 DIAGNOSIS — E1159 Type 2 diabetes mellitus with other circulatory complications: Secondary | ICD-10-CM | POA: Diagnosis not present

## 2018-03-02 DIAGNOSIS — I70221 Atherosclerosis of native arteries of extremities with rest pain, right leg: Secondary | ICD-10-CM | POA: Diagnosis not present

## 2018-03-02 DIAGNOSIS — E785 Hyperlipidemia, unspecified: Secondary | ICD-10-CM | POA: Diagnosis not present

## 2018-03-02 DIAGNOSIS — Z794 Long term (current) use of insulin: Secondary | ICD-10-CM

## 2018-03-02 NOTE — Assessment & Plan Note (Signed)
Noninvasive studies performed recently showed a left ABI of 0.9 and a right ABI of 0.7.  Her symptoms are likely a combination of malperfusion and atheroembolization.  Recommend:  The patient has evidence of severe atherosclerotic changes of both lower extremities with rest pain that is associated with preulcerative changes and impending tissue loss of the foot.  This represents a limb threatening ischemia and places the patient at the risk for limb loss.  Patient should undergo angiography of the right lower extremity with the hope for intervention for limb salvage.  The risks and benefits as well as the alternative therapies was discussed in detail with the patient.  All questions were answered.  Patient agrees to proceed with angiography.  The patient will follow up with me in the office after the procedure.

## 2018-03-02 NOTE — Assessment & Plan Note (Signed)
lipid control important in reducing the progression of atherosclerotic disease. Intolerant to statin therapy  

## 2018-03-02 NOTE — Progress Notes (Signed)
MRN : [id number]  [name] is a 72 y.o. (06/30/1945) female who presents with chief complaint of  Chief Complaint  Patient presents with  . Follow-up  .  History of Present Illness: Patient returns today in follow up of PAD.  She has been having much more pain in her right foot and lower leg.  She is having stabbing pain at rest and worsening claudication type symptoms.  She denies any fevers or chills.  This has progressed steadily but over the past few weeks has become more severe.  She denies any clear inciting event or causative factor that worsened or started the pain.  Her left leg symptoms are fairly mild although she does have occasional sharp pain and some discoloration of toes on both feet.  Noninvasive studies performed recently showed a left ABI of 0.9 and a right ABI of 0.7.  Current Outpatient Medications  Medication Sig Dispense Refill  . clopidogrel (PLAVIX) 75 MG tablet Take 1 tablet (75 mg total) by mouth daily. 30 tablet 11  . furosemide (LASIX) 20 MG tablet Take 1 tablet (20 mg total) by mouth daily as needed for fluid. 30 tablet 2  . insulin NPH-regular Human (NOVOLIN 70/30) (70-30) 100 UNIT/ML injection Inject into the skin. Inject 20 in the morning and 25 in the evening    . nitroGLYCERIN (NITROSTAT) 0.4 MG SL tablet PLACE 1 TABLET UNDER TONGUE EVERY 5 MIN AS NEEDED FOR CHEST PAIN IF NO RELIEF IN15 MIN CALL 911 (MAX 3 TABS) 25 tablet 2   No current facility-administered medications for this visit.     Past Medical History:  Diagnosis Date  . Arthritis    "hands, back" (12/09/2013)  . Aspirin allergy   . CAD (coronary artery disease)    a. 03/2010 s/p CABG x 3 (LIMA->LAD, VG->OM, VG->Diag);  b. 9/15 PCI native LCX (2.75x16 & 2.75x8 Promus DES');  c. 02/2014 Cath: LM nl, LAD 80-90/100p, LCX patent stents, OM1 100, OM2 99ost, RCA 100p, VG->Diag 100, VG->OM nl, LIMA->LAD nl, EF 55-65%-->Med Rx; d. 10/2015 NSTEMI/Cath: stable anatomy, patent LCX stent, 2/3 patent  grafts as prev noted, EF 45%-->Med Rx w/ nitrate/ranexa.  . Carotid arterial disease (HCC)    a. 08/2015 U/S: RICA 40-59%, LICA 1-39%-->f/u 1 yr.  . Chronic stable angina (HCC)   . Diastolic dysfunction    a. 01/2014 Echo: EF 55%, Gr1 DD, triv MR.  . DVT (deep venous thrombosis) (HCC) 1970's   LLE  . HLD (hyperlipidemia)    statin intolerant  . Hypertensive heart disease   . Obesity   . Orthostatic hypotension    previous Midodrine therapy  . Pneumonia 2013?  Marland Kitchen PVD (peripheral vascular disease) (HCC)    a. prior balloon PTA to left leg in [place].  . Type II diabetes mellitus (HCC)     Past Surgical History:  Procedure Laterality Date  . ANGIOPLASTY / STENTING FEMORAL Left 03/14/2011  . APPENDECTOMY    . CARDIAC CATHETERIZATION  03/2010; 2013  . CARDIAC CATHETERIZATION N/A 11/05/2015   Procedure: LEFT HEART CATH AND CORS/GRAFTS ANGIOGRAPHY;  Surgeon: Antonieta Iba, MD;  Location: [facility] INVASIVE CV LAB;  Service: Cardiovascular;  Laterality: N/A;  . Carotid dopplers  03/2010   40-59% bilateral ICA stenosis  . CATARACT EXTRACTION W/ INTRAOCULAR LENS  IMPLANT, BILATERAL Bilateral   . CHOLECYSTECTOMY  2012  . CORONARY ANGIOPLASTY WITH STENT PLACEMENT  12/09/2013   circumflex was stented 2.75 x 16 overlapping 2.75 x 8 promus drug-eluting stents  .  CORONARY ARTERY BYPASS GRAFT  03/2010   CABG X3  . LEFT HEART CATHETERIZATION WITH CORONARY/GRAFT ANGIOGRAM N/A 11/21/2011   Procedure: LEFT HEART CATHETERIZATION WITH Isabel Caprice;  Surgeon: Kathleene Hazel, MD;  Location: [facility] CATH LAB;  Service: Cardiovascular;  Laterality: N/A;  . LEFT HEART CATHETERIZATION WITH CORONARY/GRAFT ANGIOGRAM N/A 12/09/2013   Procedure: LEFT HEART CATHETERIZATION WITH Isabel Caprice;  Surgeon: Corky Crafts, MD;  Location: [facility] CATH LAB;  Service: Cardiovascular;  Laterality: N/A;  . LEFT HEART CATHETERIZATION WITH CORONARY/GRAFT ANGIOGRAM N/A 03/13/2014   Procedure: LEFT HEART  CATHETERIZATION WITH Isabel Caprice;  Surgeon: Peter M Swaziland, MD;  Location: [facility] CATH LAB;  Service: Cardiovascular;  Laterality: N/A;  . LOWER EXTREMITY ANGIOGRAPHY Left 05/04/2017   Procedure: LOWER EXTREMITY ANGIOGRAPHY;  Surgeon: Annice Needy, MD;  Location: [facility] INVASIVE CV LAB;  Service: Cardiovascular;  Laterality: Left;  . PERCUTANEOUS CORONARY STENT INTERVENTION (PCI-S)  12/09/2013   Procedure: PERCUTANEOUS CORONARY STENT INTERVENTION (PCI-S);  Surgeon: Corky Crafts, MD;  Location: [facility] CATH LAB;  Service: Cardiovascular;;  . REFRACTIVE SURGERY     laser  . RIGHT HEART CATHETERIZATION  11/21/2011   Procedure: RIGHT HEART CATH;  Surgeon: Kathleene Hazel, MD;  Location: [facility] CATH LAB;  Service: Cardiovascular;;  . TONSILLECTOMY    . TUBAL LIGATION Bilateral   . VITRECTOMY Right 2015    Social History       Tobacco Use  . Smoking status: Never Smoker  . Smokeless tobacco: Never Used  Substance Use Topics  . Alcohol use: No  . Drug use: No    Family History      Family History  Problem Relation Age of Onset  . Esophageal cancer Mother   . Stroke Mother   . Heart attack Father    MI  . Heart attack Brother    CABG x 4   . Heart disease Brother    valve replacement         Allergies  Allergen Reactions  . Bee Venom Anaphylaxis  . Statins Other (See Comments)    Crippling  . Lisinopril Other (See Comments)    Fluctuations BP  . Tylenol [Acetaminophen] Other (See Comments)    Unknown  . Aspirin Hives  . Atorvastatin Other (See Comments)    Arthralgias  . Codeine Hives and Nausea Only  . Cortisone Hives  . Demerol Hives  . Meperidine Hives and Nausea And Vomiting  . Metformin Other (See Comments)    Intolerance  . Prednisone Hives     REVIEW OF SYSTEMS(Negative unless checked)  Constitutional: [] ?Weight loss[] ?Fever[] ?Chills Cardiac:[] ?Chest pain[] ?Chest pressure[] ?Palpitations  [] ?Shortness of breath when laying flat [] ?Shortness of breath at rest [] ?Shortness of breath with exertion. Vascular: [x] ?Pain in legs with walking[] ?Pain in legsat rest[] ?Pain in legs when laying flat [x] ?Claudication [] ?Pain in feet when walking [x] ?Pain in feet at rest [] ?Pain in feet when laying flat [] ?History of DVT [] ?Phlebitis [x] ?Swelling in legs [] ?Varicose veins [] ?Non-healing ulcers Pulmonary: [] ?Uses home oxygen [] ?Productive cough[] ?Hemoptysis [] ?Wheeze [] ?COPD [] ?Asthma Neurologic: [] ?Dizziness [] ?Blackouts [] ?Seizures [] ?History of stroke [] ?History of TIA[] ?Aphasia [] ?Temporary blindness[] ?Dysphagia [] ?Weaknessor numbness in arms [] ?Weakness or numbnessin legs Musculoskeletal: [x] ?Arthritis [] ?Joint swelling [] ?Joint pain [] ?Low back pain Hematologic:[] ?Easy bruising[] ?Easy bleeding [] ?Hypercoagulable state [] ?Anemic  Gastrointestinal:[] ?Blood in stool[] ?Vomiting blood[] ?Gastroesophageal reflux/heartburn[] ?Abdominal pain Genitourinary: [] ?Chronic kidney disease [] ?Difficulturination [] ?Frequenturination [] ?Burning with urination[] ?Hematuria Skin: [] ?Rashes [] ?Ulcers [] ?Wounds Psychological: [] ?History of anxiety[] ?History of major depression.    Physical Examination  BP 133/71 (BP Location: Right Arm)   Pulse 81   Resp 16  Ht 5\' 4"  (1.626 m)   Wt 204 lb (92.5 kg)   BMI 35.02 kg/m  Gen:  WD/WN, NAD Head: [place]/AT, No temporalis wasting. Ear/Nose/Throat: Hearing grossly intact, nares w/o erythema or drainage Eyes: Conjunctiva clear. Sclera non-icteric Neck: Supple.  Trachea midline Pulmonary:  Good air movement, no use of accessory muscles.  Cardiac: Somewhat irregular Vascular:  Vessel Right Left  Radial Palpable Palpable                          PT  1+ palpable  1+ palpable  DP  1+ palpable Palpable   Gastrointestinal: soft, non-tender/non-distended.    Musculoskeletal: M/S 5/5 throughout.  No deformity or atrophy.  Trace lower extremity edema.  Some mild purplish blue discoloration of the toes on the right foot as well as the left great toe. Neurologic: Sensation grossly intact in extremities.  Symmetrical.  Speech is fluent.  Psychiatric: Judgment intact, Mood & affect appropriate for pt's clinical situation. Dermatologic: No rashes or ulcers noted.  No cellulitis or open wounds.       Labs No results found for this or any previous visit (from the past 2160 hour(s)).  Radiology Vas [place] Vanice Sarah With/wo Tbi  Result Date: 02/20/2018 LOWER EXTREMITY DOPPLER STUDY Indications: Claudication, rest pain, and peripheral artery disease.  Vascular Interventions: 05/04/2017 PTA of the left ATA and popliteal artery.                         Stent left popliteal. Comparison Study: 06/13/2017 Performing Technologist: Reece Agar RT (R)(VS)  Examination Guidelines: A complete evaluation includes at minimum, Doppler waveform signals and systolic blood pressure reading at the level of bilateral brachial, anterior tibial, and posterior tibial arteries, when vessel segments are accessible. Bilateral testing is considered an integral part of a complete examination. Photoelectric Plethysmograph (PPG) waveforms and toe systolic pressure readings are included as required and additional duplex testing as needed. Limited examinations for reoccurring indications may be performed as noted.  ABI Findings: +---------+------------------+-----+--------+--------+ Right    Rt Pressure (mmHg)IndexWaveformComment  +---------+------------------+-----+--------+--------+ Brachial 150                                     +---------+------------------+-----+--------+--------+ ATA      110               0.72 biphasic         +---------+------------------+-----+--------+--------+ PTA      88                0.58 biphasic          +---------+------------------+-----+--------+--------+ Great Toe54                0.35                  +---------+------------------+-----+--------+--------+ +---------+------------------+-----+----------+-------+ Left     Lt Pressure (mmHg)IndexWaveform  Comment +---------+------------------+-----+----------+-------+ Brachial 153                                      +---------+------------------+-----+----------+-------+ ATA      143               0.93 monophasic        +---------+------------------+-----+----------+-------+ PTA      131  0.86 biphasic          +---------+------------------+-----+----------+-------+ Great Toe64                0.42                   +---------+------------------+-----+----------+-------+ +-------+-----------+-----------+------------+------------+ ABI/TBIToday's ABIToday's TBIPrevious ABIPrevious TBI +-------+-----------+-----------+------------+------------+ Right  .72        .35        .85                      +-------+-----------+-----------+------------+------------+ Left   .93        .42        1.07                     +-------+-----------+-----------+------------+------------+ Bilateral ABIs appear decreased compared to prior study on 06/13/2017.  Summary: Right: Resting right ankle-brachial index indicates moderate right lower extremity arterial disease. The right toe-brachial index is abnormal. Left: Resting left ankle-brachial index indicates mild left lower extremity arterial disease. The left toe-brachial index is abnormal.  *See table(s) above for measurements and observations.  Electronically signed by Festus BarrenJason Dew MD on 02/20/2018 at 4:17:50 PM.   Final    Vas [place] Lower Extremity Arterial Duplex  Result Date: 02/20/2018 LOWER EXTREMITY ARTERIAL DUPLEX STUDY Indications: Rest pain, and peripheral artery disease.  Vascular Interventions: 05/04/2017 PTA of the left ATA. PTA of the left popliteal                          artery. Stent left popliteal artery. Current ABI:            Right = .72 Left = .93 Comparison Study: 06/13/2017 Performing Technologist: Reece AgarValarie Baldwin RT (R)(VS)  Examination Guidelines: A complete evaluation includes B-mode imaging, spectral Doppler, color Doppler, and power Doppler as needed of all accessible portions of each vessel. Bilateral testing is considered an integral part of a complete examination. Limited examinations for reoccurring indications may be performed as noted  Left Duplex Findings: +----------+--------+-----+--------+----------+--------+           PSV cm/sRatioStenosisWaveform  Comments +----------+--------+-----+--------+----------+--------+ CFA Prox  135                  triphasic          +----------+--------+-----+--------+----------+--------+ DFA       95                   triphasic          +----------+--------+-----+--------+----------+--------+ SFA Prox  93                   triphasic          +----------+--------+-----+--------+----------+--------+ SFA Mid   88                   triphasic          +----------+--------+-----+--------+----------+--------+ SFA Distal90                   triphasic stent    +----------+--------+-----+--------+----------+--------+ POP Prox  101                  triphasic stent    +----------+--------+-----+--------+----------+--------+ POP Distal138                  triphasic stent    +----------+--------+-----+--------+----------+--------+ ATA Distal63  monophasic         +----------+--------+-----+--------+----------+--------+ PTA [id number]                  biphasic           +----------+--------+-----+--------+----------+--------+  Summary: Right: Near normal examination. Left: Left stent is patent with no restenosis.  See table(s) above for measurements and observations. Electronically signed by Festus Barren MD on 02/20/2018 at 4:18:06 PM.    Final      Assessment/Plan Type II diabetes mellitus (HCC) blood glucose control important in reducing the progression of atherosclerotic disease. Also, involved in wound healing. On appropriate medications.   Essential hypertension blood pressure control important in reducing the progression of atherosclerotic disease. On appropriate oral medications.  HLD (hyperlipidemia) lipid control important in reducing the progression of atherosclerotic disease. Intolerant to statin therapy   Atherosclerosis of native arteries of extremity with rest pain (HCC) Noninvasive studies performed recently showed a left ABI of 0.9 and a right ABI of 0.7.  Her symptoms are likely a combination of malperfusion and atheroembolization.  Recommend:  The patient has evidence of severe atherosclerotic changes of both lower extremities with rest pain that is associated with preulcerative changes and impending tissue loss of the foot.  This represents a limb threatening ischemia and places the patient at the risk for limb loss.  Patient should undergo angiography of the right lower extremity with the hope for intervention for limb salvage.  The risks and benefits as well as the alternative therapies was discussed in detail with the patient.  All questions were answered.  Patient agrees to proceed with angiography.  The patient will follow up with me in the office after the procedure.         Festus Barren, MD  03/02/2018 12:43 PM    This note was created with Dragon medical transcription system.  Any errors from dictation are purely unintentional

## 2018-03-02 NOTE — Patient Instructions (Signed)
Endovascular Therapy for Peripheral Arterial Disease, Care After  This sheet gives you information about how to care for yourself after your procedure. Your health care provider may also give you more specific instructions. If you have problems or questions, contact your health care provider.  What can I expect after the procedure?  After the procedure, it is common to have:   Pain.   Soreness and bruising around your puncture or incision (access site).   Fatigue.  Follow these instructions at home:  Access site care   Follow instructions from your health care provider about how to take care of your access site. Make sure you:  ? Wash your hands with soap and water before you change your bandage (dressing). If soap and water are not available, use hand sanitizer.  ? Change your dressing as told by your health care provider.  ? Leave stitches (sutures), skin glue, or adhesive strips in place. If adhesive strip edges start to loosen and curl up, you may trim the loose edges. Do not remove adhesive strips or skin glue completely unless your health care provider tells you to do that.   Check your access site every day for signs of infection. Check for:  ? Redness, swelling, or pain.  ? A lump or bump.  ? Fluid or blood.  ? Warmth.  ? Pus or a bad smell.  Medicines   Take over-the-counter and prescription medicines only as told by your health care provider. You may need to take medicines to prevent blood clots and to lower your cholesterol.   If you were prescribed antibiotic medicine, take it as told by your health care provider. Do not stop taking the antibiotic even if you start to feel better.  Driving   Do not drive until your health care provider approves. You should:  ? Not drive for 24 hours if you were given a medicine to help you relax (sedative) during your procedure.  ? Not drive or use heavy machinery while taking prescription pain medicine.  Activity   Do not lift anything that is heavier than 10  lb (4.5 kg) until your health care provider says that it is safe. You may have this lifting limit for several days.   Return to your normal activities as told by your health care provider. Ask your health care provider what activities are safe for you.  ? Avoid activity that requires a lot of energy, such as exercise and sports, as told by your health care provider.  ? Avoid sexual activity until your health care provider says it is safe.   Follow your exercise plan as told by your health care provider.  Eating and drinking     Drink fluids as instructed to help wash (flush) dye used during the procedure out of your body.   Follow instructions from your health care provider about eating or drinking restrictions. You may need to eat a diet that is low in salt (sodium) and fat.   Avoid drinking alcohol.  General instructions   Do not take baths, swim, or use a hot tub until your health care provider approves. You may take showers.   Do not use any products that contain nicotine or tobacco, such as cigarettes and e-cigarettes. If you need help quitting, ask your health care provider.   Keep all follow-up visits as told by your health care provider. This is important.  Contact a health care provider if:   You have a fever.   You   have severe pain that does not get better with medicine.   You have redness, swelling, or pain around your access site.   You have a fever.   You have a lump or bump at your access site.  Get help right away if:     You have fluid or blood coming from your access site. If this happens, lie down on your back and apply pressure to the area.   You have chest pain.   You have problems breathing.   You have pain, numbness, or tingling in your legs.   You faint.   You have any symptoms of a stroke. "BE FAST" is an easy way to remember the main warning signs of a stroke:  ? B - Balance. Signs are dizziness, sudden trouble walking, or loss of balance.  ? E - Eyes. Signs are trouble  seeing or a sudden change in vision.  ? F - Face. Signs are sudden weakness or numbness of the face, or the face or eyelid drooping on one side.  ? A - Arms. Signs are weakness or numbness in an arm. This happens suddenly and usually on one side of the body.  ? S - Speech. Signs are sudden trouble speaking, slurred speech, or trouble understanding what people say.  ? T - Time. Time to call emergency services. Write down what time symptoms started.   You have other signs of a stroke, such as:  ? A sudden, severe headache with no known cause.  ? Nausea or vomiting.  ? Seizure.  These symptoms may represent a serious problem that is an emergency. Do not wait to see if the symptoms will go away. Get medical help right away. Call your local emergency services (911 in the [place] Do not drive yourself to the hospital.  Summary   After the procedure, it is common to have pain and soreness near your puncture or incision (access site).   Check your access site every day for signs of infection, such as redness, swelling, or pain.   You may need to take medicines to prevent blood clots and to lower your cholesterol.   If you have any signs of a stroke, get help right away.  This information is not intended to replace advice given to you by your health care provider. Make sure you discuss any questions you have with your health care provider.  Document Released: 06/22/2016 Document Revised: 06/22/2016 Document Reviewed: 06/22/2016  Elsevier Interactive Patient Education  2019 [employer]

## 2018-03-05 ENCOUNTER — Encounter (INDEPENDENT_AMBULATORY_CARE_PROVIDER_SITE_OTHER): Payer: Self-pay

## 2018-03-05 ENCOUNTER — Telehealth (INDEPENDENT_AMBULATORY_CARE_PROVIDER_SITE_OTHER): Payer: Self-pay

## 2018-03-05 NOTE — Telephone Encounter (Signed)
Spoke with the patient and went over instructions, day and time for pre-admit appt as well as procedure day and time. All information will be mailed out to the patient.

## 2018-03-13 ENCOUNTER — Other Ambulatory Visit (INDEPENDENT_AMBULATORY_CARE_PROVIDER_SITE_OTHER): Payer: Self-pay | Admitting: Nurse Practitioner

## 2018-03-16 ENCOUNTER — Other Ambulatory Visit: Payer: Self-pay

## 2018-03-16 ENCOUNTER — Encounter
Admission: RE | Admit: 2018-03-16 | Discharge: 2018-03-16 | Disposition: A | Discharge status: Home / Self Care | Payer: Medicare HMO | Source: Ambulatory Visit | Attending: Vascular Surgery | Admitting: Vascular Surgery

## 2018-03-16 DIAGNOSIS — Z951 Presence of aortocoronary bypass graft: Secondary | ICD-10-CM | POA: Diagnosis not present

## 2018-03-16 DIAGNOSIS — E669 Obesity, unspecified: Secondary | ICD-10-CM | POA: Diagnosis not present

## 2018-03-16 DIAGNOSIS — Z7902 Long term (current) use of antithrombotics/antiplatelets: Secondary | ICD-10-CM | POA: Diagnosis not present

## 2018-03-16 DIAGNOSIS — Z886 Allergy status to analgesic agent status: Secondary | ICD-10-CM | POA: Diagnosis not present

## 2018-03-16 DIAGNOSIS — I70221 Atherosclerosis of native arteries of extremities with rest pain, right leg: Secondary | ICD-10-CM | POA: Diagnosis not present

## 2018-03-16 DIAGNOSIS — I5030 Unspecified diastolic (congestive) heart failure: Secondary | ICD-10-CM | POA: Diagnosis not present

## 2018-03-16 DIAGNOSIS — I771 Stricture of artery: Secondary | ICD-10-CM | POA: Diagnosis not present

## 2018-03-16 DIAGNOSIS — I110 Hypertensive heart disease with heart failure: Secondary | ICD-10-CM | POA: Diagnosis not present

## 2018-03-16 DIAGNOSIS — Z86718 Personal history of other venous thrombosis and embolism: Secondary | ICD-10-CM | POA: Diagnosis not present

## 2018-03-16 DIAGNOSIS — Z888 Allergy status to other drugs, medicaments and biological substances status: Secondary | ICD-10-CM | POA: Diagnosis not present

## 2018-03-16 DIAGNOSIS — Z95820 Peripheral vascular angioplasty status with implants and grafts: Secondary | ICD-10-CM | POA: Diagnosis not present

## 2018-03-16 DIAGNOSIS — Z8249 Family history of ischemic heart disease and other diseases of the circulatory system: Secondary | ICD-10-CM | POA: Diagnosis not present

## 2018-03-16 DIAGNOSIS — Z823 Family history of stroke: Secondary | ICD-10-CM | POA: Diagnosis not present

## 2018-03-16 DIAGNOSIS — Z6835 Body mass index (BMI) 35.0-35.9, adult: Secondary | ICD-10-CM | POA: Diagnosis not present

## 2018-03-16 DIAGNOSIS — Z885 Allergy status to narcotic agent status: Secondary | ICD-10-CM | POA: Diagnosis not present

## 2018-03-16 DIAGNOSIS — Z955 Presence of coronary angioplasty implant and graft: Secondary | ICD-10-CM | POA: Diagnosis not present

## 2018-03-16 DIAGNOSIS — E785 Hyperlipidemia, unspecified: Secondary | ICD-10-CM | POA: Diagnosis not present

## 2018-03-16 DIAGNOSIS — E1159 Type 2 diabetes mellitus with other circulatory complications: Secondary | ICD-10-CM | POA: Diagnosis not present

## 2018-03-16 DIAGNOSIS — M1990 Unspecified osteoarthritis, unspecified site: Secondary | ICD-10-CM | POA: Diagnosis not present

## 2018-03-16 DIAGNOSIS — Z794 Long term (current) use of insulin: Secondary | ICD-10-CM | POA: Diagnosis not present

## 2018-03-16 DIAGNOSIS — Z01812 Encounter for preprocedural laboratory examination: Secondary | ICD-10-CM | POA: Insufficient documentation

## 2018-03-16 DIAGNOSIS — I6523 Occlusion and stenosis of bilateral carotid arteries: Secondary | ICD-10-CM | POA: Diagnosis not present

## 2018-03-16 HISTORY — DX: Nausea with vomiting, unspecified: Z98.890

## 2018-03-16 HISTORY — DX: Nausea with vomiting, unspecified: R11.2

## 2018-03-16 HISTORY — DX: Acute myocardial infarction, unspecified: I21.9

## 2018-03-16 LAB — BUN: BUN: 18 mg/dL (ref 8–23)

## 2018-03-16 LAB — CREATININE, SERUM
Creatinine, Ser: 0.74 mg/dL (ref 0.44–1.00)
GFR calc Af Amer: >60 mL/min (ref >60)
GFR calc non Af Amer: >60 mL/min (ref >60)

## 2018-03-16 NOTE — Patient Instructions (Signed)
FOLLOW INSTRUCTIONS GIVEN TO YOU BY [facility].  Your procedure is scheduled with Dr. Wyn Quaker                            On:  Monday, March 19, 2018    Go to [facility]' on the first floor of [facility].  Do not eat or drink 8 hours prior to your procedure.    Continue taking Plavix.  Please call Dr Wyn Quaker office with any questions or concerns: [phone].  You will need to have someone drive you home and stay with you the night of the procedure.

## 2018-03-19 ENCOUNTER — Ambulatory Visit
Admission: RE | Admit: 2018-03-19 | Discharge: 2018-03-19 | Disposition: A | Discharge status: Home / Self Care | Payer: Medicare HMO | Attending: Vascular Surgery | Admitting: Vascular Surgery

## 2018-03-19 ENCOUNTER — Other Ambulatory Visit: Payer: Self-pay

## 2018-03-19 ENCOUNTER — Encounter
Admission: RE | Disposition: A | Discharge status: Home / Self Care | Payer: Self-pay | Source: Home / Self Care | Attending: Vascular Surgery

## 2018-03-19 DIAGNOSIS — Z885 Allergy status to narcotic agent status: Secondary | ICD-10-CM | POA: Insufficient documentation

## 2018-03-19 DIAGNOSIS — Z8249 Family history of ischemic heart disease and other diseases of the circulatory system: Secondary | ICD-10-CM | POA: Insufficient documentation

## 2018-03-19 DIAGNOSIS — Z6835 Body mass index (BMI) 35.0-35.9, adult: Secondary | ICD-10-CM | POA: Insufficient documentation

## 2018-03-19 DIAGNOSIS — I110 Hypertensive heart disease with heart failure: Secondary | ICD-10-CM | POA: Insufficient documentation

## 2018-03-19 DIAGNOSIS — Z7902 Long term (current) use of antithrombotics/antiplatelets: Secondary | ICD-10-CM | POA: Insufficient documentation

## 2018-03-19 DIAGNOSIS — M1990 Unspecified osteoarthritis, unspecified site: Secondary | ICD-10-CM | POA: Insufficient documentation

## 2018-03-19 DIAGNOSIS — E1159 Type 2 diabetes mellitus with other circulatory complications: Secondary | ICD-10-CM | POA: Insufficient documentation

## 2018-03-19 DIAGNOSIS — Z95820 Peripheral vascular angioplasty status with implants and grafts: Secondary | ICD-10-CM | POA: Insufficient documentation

## 2018-03-19 DIAGNOSIS — I70221 Atherosclerosis of native arteries of extremities with rest pain, right leg: Secondary | ICD-10-CM | POA: Insufficient documentation

## 2018-03-19 DIAGNOSIS — I771 Stricture of artery: Secondary | ICD-10-CM | POA: Insufficient documentation

## 2018-03-19 DIAGNOSIS — E669 Obesity, unspecified: Secondary | ICD-10-CM | POA: Insufficient documentation

## 2018-03-19 DIAGNOSIS — E785 Hyperlipidemia, unspecified: Secondary | ICD-10-CM | POA: Insufficient documentation

## 2018-03-19 DIAGNOSIS — Z888 Allergy status to other drugs, medicaments and biological substances status: Secondary | ICD-10-CM | POA: Insufficient documentation

## 2018-03-19 DIAGNOSIS — I6523 Occlusion and stenosis of bilateral carotid arteries: Secondary | ICD-10-CM | POA: Insufficient documentation

## 2018-03-19 DIAGNOSIS — Z951 Presence of aortocoronary bypass graft: Secondary | ICD-10-CM | POA: Insufficient documentation

## 2018-03-19 DIAGNOSIS — Z823 Family history of stroke: Secondary | ICD-10-CM | POA: Insufficient documentation

## 2018-03-19 DIAGNOSIS — Z955 Presence of coronary angioplasty implant and graft: Secondary | ICD-10-CM | POA: Insufficient documentation

## 2018-03-19 DIAGNOSIS — Z794 Long term (current) use of insulin: Secondary | ICD-10-CM | POA: Insufficient documentation

## 2018-03-19 DIAGNOSIS — I5030 Unspecified diastolic (congestive) heart failure: Secondary | ICD-10-CM | POA: Insufficient documentation

## 2018-03-19 DIAGNOSIS — Z886 Allergy status to analgesic agent status: Secondary | ICD-10-CM | POA: Insufficient documentation

## 2018-03-19 DIAGNOSIS — I7092 Chronic total occlusion of artery of the extremities: Secondary | ICD-10-CM | POA: Diagnosis not present

## 2018-03-19 DIAGNOSIS — I70229 Atherosclerosis of native arteries of extremities with rest pain, unspecified extremity: Secondary | ICD-10-CM

## 2018-03-19 DIAGNOSIS — Z86718 Personal history of other venous thrombosis and embolism: Secondary | ICD-10-CM | POA: Insufficient documentation

## 2018-03-19 HISTORY — PX: LOWER EXTREMITY ANGIOGRAPHY: CATH118251

## 2018-03-19 LAB — GLUCOSE, CAPILLARY
Glucose-Capillary: 108 mg/dL — ABNORMAL HIGH (ref 70–99)
Glucose-Capillary: 78 mg/dL (ref 70–99)
Glucose-Capillary: 175 mg/dL — ABNORMAL HIGH (ref 70–99)

## 2018-03-19 SURGERY — LOWER EXTREMITY ANGIOGRAPHY
Anesthesia: Moderate Sedation | Laterality: Right

## 2018-03-19 MED ORDER — LABETALOL HCL 5 MG/ML IV SOLN: 10.0000 mg | INTRAVENOUS | Status: DC | PRN

## 2018-03-19 MED ORDER — HYDROMORPHONE HCL 1 MG/ML IJ SOLN: 1.0000 mg | Freq: Once | INTRAMUSCULAR | Status: DC | PRN

## 2018-03-19 MED ORDER — CEFAZOLIN SODIUM-DEXTROSE 2-4 GM/100ML-% IV SOLN
INTRAVENOUS | Status: AC
  Filled 2018-03-19: qty 100

## 2018-03-19 MED ORDER — HEPARIN SODIUM (PORCINE) 1000 UNIT/ML IJ SOLN
INTRAMUSCULAR | Status: AC
  Filled 2018-03-19: qty 1

## 2018-03-19 MED ORDER — ONDANSETRON HCL 4 MG/2ML IJ SOLN
INTRAMUSCULAR | Status: AC
  Filled 2018-03-19: qty 2

## 2018-03-19 MED ORDER — CEFAZOLIN SODIUM 1 G IJ SOLR
2.0000 g | Freq: Once | INTRAMUSCULAR | Status: AC
  Administered 2018-03-19: 2 g via INTRAVENOUS
  Filled 2018-03-19: qty 20

## 2018-03-19 MED ORDER — ACETAMINOPHEN 325 MG PO TABS: 650.0000 mg | ORAL_TABLET | ORAL | Status: DC | PRN

## 2018-03-19 MED ORDER — MORPHINE SULFATE (PF) 2 MG/ML IV SOLN
INTRAVENOUS | Status: AC
  Filled 2018-03-19: qty 3

## 2018-03-19 MED ORDER — DEXTROSE 50 % IV SOLN
INTRAVENOUS | Status: AC
  Filled 2018-03-19: qty 50

## 2018-03-19 MED ORDER — HYDRALAZINE HCL 20 MG/ML IJ SOLN: 5.0000 mg | INTRAMUSCULAR | Status: DC | PRN

## 2018-03-19 MED ORDER — ONDANSETRON HCL 4 MG/2ML IJ SOLN: 4.0000 mg | Freq: Four times a day (QID) | INTRAMUSCULAR | Status: DC | PRN

## 2018-03-19 MED ORDER — SODIUM CHLORIDE 0.9 % IV SOLN: 250.0000 mL | INTRAVENOUS | Status: DC | PRN

## 2018-03-19 MED ORDER — HEPARIN SODIUM (PORCINE) 1000 UNIT/ML IJ SOLN
INTRAMUSCULAR | Status: DC | PRN
  Administered 2018-03-19: 5000 [IU] via INTRAVENOUS

## 2018-03-19 MED ORDER — DEXTROSE 50 % IV SOLN
1.0000 | Freq: Once | INTRAVENOUS | Status: AC
  Administered 2018-03-19: 50 mL via INTRAVENOUS

## 2018-03-19 MED ORDER — SODIUM CHLORIDE 0.9 % IV SOLN: INTRAVENOUS | Status: DC

## 2018-03-19 MED ORDER — SODIUM CHLORIDE 0.9% FLUSH: 3.0000 mL | Freq: Two times a day (BID) | INTRAVENOUS | Status: DC

## 2018-03-19 MED ORDER — HEPARIN (PORCINE) IN NACL 1000-0.9 UT/500ML-% IV SOLN
INTRAVENOUS | Status: AC
  Filled 2018-03-19: qty 1000

## 2018-03-19 MED ORDER — SODIUM CHLORIDE 0.9% FLUSH: 3.0000 mL | INTRAVENOUS | Status: DC | PRN

## 2018-03-19 MED ORDER — MIDAZOLAM HCL 2 MG/2ML IJ SOLN
INTRAMUSCULAR | Status: DC | PRN
  Administered 2018-03-19 (×3): 1 mg via INTRAVENOUS
  Administered 2018-03-19: 2 mg via INTRAVENOUS

## 2018-03-19 MED ORDER — MIDAZOLAM HCL 5 MG/5ML IJ SOLN
INTRAMUSCULAR | Status: AC
  Filled 2018-03-19: qty 5

## 2018-03-19 MED ORDER — SODIUM CHLORIDE 0.9 % IV SOLN
INTRAVENOUS | Status: DC
  Administered 2018-03-19: 11:00:00 via INTRAVENOUS

## 2018-03-19 MED ORDER — LIDOCAINE-EPINEPHRINE (PF) 1 %-1:200000 IJ SOLN
INTRAMUSCULAR | Status: AC
  Filled 2018-03-19: qty 10

## 2018-03-19 SURGICAL SUPPLY — 22 items
BALLN LUTONIX 4X120X130 (BALLOONS) ×3
BALLN ULTRVRSE 2.5X300X150 (BALLOONS) ×3
BALLN ULTRVRSE 3X300X150 (BALLOONS) ×3
BALLN ULTRVRSE 3X300X150 OTW (BALLOONS) ×1
BALLOON LUTONIX 4X120X130 (BALLOONS) IMPLANT
BALLOON ULTRVRSE 2.5X300X150 (BALLOONS) IMPLANT
BALLOON ULTRVRSE 3X300X150 OTW (BALLOONS) IMPLANT
CATH BEACON 5 .038 100 VERT TP (CATHETERS) ×2 IMPLANT
CATH CXI SUPP ANG 4FR 135 (CATHETERS) IMPLANT
CATH CXI SUPP ANG 4FR 135CM (CATHETERS) ×3
CATH PIG 70CM (CATHETERS) ×2 IMPLANT
COVER PROBE U/S 5X48 (MISCELLANEOUS) ×2 IMPLANT
DEVICE PRESTO INFLATION (MISCELLANEOUS) ×2 IMPLANT
DEVICE STARCLOSE SE CLOSURE (Vascular Products) ×2 IMPLANT
GLIDEWIRE ADV .035X260CM (WIRE) ×2 IMPLANT
PACK ANGIOGRAPHY (CUSTOM PROCEDURE TRAY) ×3 IMPLANT
SHEATH ANL2 6FRX45 HC (SHEATH) ×2 IMPLANT
SHEATH BRITE TIP 5FRX11 (SHEATH) ×2 IMPLANT
SYR MEDRAD MARK V 150ML (SYRINGE) ×2 IMPLANT
TUBING CONTRAST HIGH PRESS 72 (TUBING) ×2 IMPLANT
WIRE G V18X300CM (WIRE) ×2 IMPLANT
WIRE J 3MM .035X145CM (WIRE) ×2 IMPLANT

## 2018-03-19 NOTE — H&P (Signed)
[facility] History & Physical Update  The patient was interviewed and re-examined.  The patient's previous History and Physical has been reviewed and is unchanged.  There is no change in the plan of care. We plan to proceed with the scheduled procedure.  Festus Barren, MD  03/19/2018, 10:52 AM

## 2018-03-19 NOTE — Op Note (Signed)
[facility]  Percutaneous Study/Intervention Procedural Note   Date of Surgery: 03/19/2018  Surgeon(s):,    Assistants:none  Pre-operative Diagnosis: PAD with rest pain right foot  Post-operative diagnosis:  Same  Procedure(s) Performed:             1.  Ultrasound guidance for vascular access left femoral artery             2.  Catheter placement into right common femoral artery from left femoral approach             3.  Aortogram and selective right lower extremity angiogram occluding selective images of the anterior tibial and peroneal arteries             4.  Percutaneous transluminal angioplasty of right anterior tibial artery with 3 mm diameter by 30 cm length angioplasty balloon             5.   Percutaneous transluminal angioplasty of the right popliteal artery with 4 mm diameter by 12 cm length Lutonix drug-coated angioplasty balloon  6.  Percutaneous transluminal angioplasty of the right peroneal artery and tibioperoneal trunk with 2.5 mm diameter by 30 cm length angioplasty balloon             7.  StarClose closure device left femoral artery  EBL: 5 cc  Contrast: 70 cc  Fluoro Time: 6.6 minutes  Moderate Conscious Sedation Time: approximately 45 minutes using 4 mg of Versed               Indications:  Patient is a 73 y.o.female with worsening pain in her right foot worrisome for rest pain. The patient has noninvasive study showing reduced perfusion in the right foot. The patient is brought in for angiography for further evaluation and potential treatment.  Due to the limb threatening nature of the situation, angiogram was performed for attempted limb salvage. The patient is aware that if the procedure fails, amputation would be expected.  The patient also understands that even with successful revascularization, amputation may still be required due to the severity of the situation. Risks and benefits are discussed and informed consent is  obtained.   Procedure:  The patient was identified and appropriate procedural time out was performed.  The patient was then placed supine on the table and prepped and draped in the usual sterile fashion. Moderate conscious sedation was administered during a face to face encounter with the patient throughout the procedure with my supervision of the RN administering medicines and monitoring the patient's vital signs, pulse oximetry, telemetry and mental status throughout from the start of the procedure until the patient was taken to the recovery room. Ultrasound was used to evaluate the left common femoral artery.  It was patent .  A digital ultrasound image was acquired.  A Seldinger needle was used to access the left common femoral artery under direct ultrasound guidance and a permanent image was performed.  A 0.035 J wire was advanced without resistance and a 5Fr sheath was placed.  Pigtail catheter was placed into the aorta and an AP aortogram was performed. This demonstrated normal renal arteries and normal aorta and iliac segments without significant stenosis. I then crossed the aortic bifurcation and advanced to the right femoral head. Selective right lower extremity angiogram was then performed. This demonstrated normal common femoral artery, profunda femoris artery, and superficial femoral artery with mild disease but no high-grade stenosis.  The popliteal artery then had about a 70% stenosis  at and just above the level of the knee and then after several centimeters of normalizing, it occluded.  All 3 tibial vessels were occluded proximally and the anterior tibial artery had about an 80% stenosis 10 to 12 cm beyond its origin after reconstitution.  Similarly, the peroneal artery reconstituted near its origin and had moderate disease in the mid and proximal segments.  The posterior tibial artery was chronically occluded. It was felt that it was in the patient's best interest to proceed with intervention  after these images to avoid a second procedure and a larger amount of contrast and fluoroscopy based off of the findings from the initial angiogram. The patient was systemically heparinized and a 6 [place] Ansell sheath was then placed over the [employer] wire. I then used a Kumpe catheter and the advantage wire to navigate across the popliteal lesions and initially traverse into the anterior tibial artery.  The Kumpe catheter would not pass beyond the origin of the anterior tibial artery but selective imaging showed that we were intraluminal.  I then used a 0.018 wire and parked this down into the foot crossing both the proximal occlusion and the stenosis about 10 to 12 cm beyond the origin.  A 3 mm diameter by 30 cm length angioplasty balloon was then inflated from the mid anterior tibial artery up into the popliteal artery.  This was taken to 8 atm for 1 minute.  To treat the popliteal lesion, a 4 mm diameter by 12 cm length Lutonix drug-coated angioplasty balloon was then used.  This was inflated to 12 atm for 1 minute in the right popliteal artery.  Completion imaging showed only about a 20% residual stenosis in the popliteal artery and about a 30 to 35% residual stenosis in the anterior tibial artery.  At this point, I felt if we could open up a second runoff vessel it would be in her best interest.  Using the Kumpe catheter and the V 18 wire I was able to navigate down into the tibioperoneal trunk without difficulty.  Intraluminal flow was confirmed in the proximal right peroneal artery.  I then navigated a wire down to the distal right peroneal artery.  A 2.5 mm diameter by 30 cm length angioplasty balloon was used to treat the right peroneal artery and tibioperoneal trunk and was inflated to 10 atm for 1 minute from the mid to distal peroneal artery up to the tibioperoneal trunk.  Completion imaging showed about 30% residual stenosis in the peroneal artery. I elected to terminate the procedure. The  sheath was removed and StarClose closure device was deployed in the left femoral artery with excellent hemostatic result. The patient was taken to the recovery room in stable condition having tolerated the procedure well.  Findings:               Aortogram:  Normal renal arteries, normal aorta and iliac arteries without stenosis             Right Lower Extremity:  Reasonably normal common femoral artery, profunda femoris artery, and superficial femoral artery with mild disease but no high-grade stenosis.  The popliteal artery then had about a 70% stenosis at and just above the level of the knee and then after several centimeters of normalizing, it occluded.  All 3 tibial vessels were occluded proximally and the anterior tibial artery had about an 80% stenosis 10 to 12 cm beyond its origin after reconstitution.  Similarly, the peroneal artery reconstituted near its origin and  had moderate disease in the mid and proximal segments.  The posterior tibial artery was chronically occluded   Disposition: Patient was taken to the recovery room in stable condition having tolerated the procedure well.  Complications: None  Leotis Pain 03/19/2018 1:36 PM   This note was created with Dragon Medical transcription system. Any errors in dictation are purely unintentional.

## 2018-03-20 ENCOUNTER — Encounter: Payer: Self-pay | Admitting: Vascular Surgery

## 2018-04-17 ENCOUNTER — Ambulatory Visit (INDEPENDENT_AMBULATORY_CARE_PROVIDER_SITE_OTHER): Payer: Medicare HMO | Admitting: Vascular Surgery

## 2018-04-17 ENCOUNTER — Encounter (INDEPENDENT_AMBULATORY_CARE_PROVIDER_SITE_OTHER): Payer: Medicare HMO

## 2018-05-03 ENCOUNTER — Other Ambulatory Visit (INDEPENDENT_AMBULATORY_CARE_PROVIDER_SITE_OTHER): Payer: Self-pay | Admitting: Vascular Surgery

## 2018-05-03 DIAGNOSIS — Z9862 Peripheral vascular angioplasty status: Secondary | ICD-10-CM

## 2018-05-03 DIAGNOSIS — I70221 Atherosclerosis of native arteries of extremities with rest pain, right leg: Secondary | ICD-10-CM

## 2018-05-04 ENCOUNTER — Encounter (INDEPENDENT_AMBULATORY_CARE_PROVIDER_SITE_OTHER): Payer: Self-pay | Admitting: Vascular Surgery

## 2018-05-04 ENCOUNTER — Ambulatory Visit (INDEPENDENT_AMBULATORY_CARE_PROVIDER_SITE_OTHER): Payer: Medicare HMO | Admitting: Vascular Surgery

## 2018-05-04 ENCOUNTER — Ambulatory Visit (INDEPENDENT_AMBULATORY_CARE_PROVIDER_SITE_OTHER): Payer: Medicare HMO

## 2018-05-04 ENCOUNTER — Other Ambulatory Visit: Payer: Self-pay

## 2018-05-04 VITALS — BP 145/85 | HR 79 | Resp 16 | Ht 64.0 in | Wt 207.8 lb

## 2018-05-04 DIAGNOSIS — E785 Hyperlipidemia, unspecified: Secondary | ICD-10-CM | POA: Diagnosis not present

## 2018-05-04 DIAGNOSIS — I10 Essential (primary) hypertension: Secondary | ICD-10-CM

## 2018-05-04 DIAGNOSIS — I70221 Atherosclerosis of native arteries of extremities with rest pain, right leg: Secondary | ICD-10-CM

## 2018-05-04 DIAGNOSIS — Z9862 Peripheral vascular angioplasty status: Secondary | ICD-10-CM

## 2018-05-04 DIAGNOSIS — E1159 Type 2 diabetes mellitus with other circulatory complications: Secondary | ICD-10-CM | POA: Diagnosis not present

## 2018-05-04 DIAGNOSIS — Z794 Long term (current) use of insulin: Secondary | ICD-10-CM

## 2018-05-04 MED ORDER — CLOPIDOGREL BISULFATE 75 MG PO TABS: 75.0000 mg | ORAL_TABLET | Freq: Every day | ORAL | 11 refills | Status: DC

## 2018-05-04 NOTE — Progress Notes (Signed)
MRN : [id number]  [name] is a 73 y.o. (01-30-1946) female who presents with chief complaint of  Chief Complaint  Patient presents with  . Follow-up    [facility] 4week abi  .  History of Present Illness: Patient returns today in follow up of PAD.  A few days ago she had an abrupt pain in her left leg that lasted several hours but eventually went away.  Her color of her feet and the discomfort in her feet is markedly improved after her right lower extremity intervention last month.  Her ABIs today show a marked improvement on the right side now up to 0.85 with biphasic waveforms and a normal digital pressure of 100.  Her left leg perfusion is reasonably stable at 0.79 with a digit pressure of 92.  Current Outpatient Medications  Medication Sig Dispense Refill  . clopidogrel (PLAVIX) 75 MG tablet Take 1 tablet (75 mg total) by mouth daily. 30 tablet 11  . furosemide (LASIX) 20 MG tablet Take 1 tablet (20 mg total) by mouth daily as needed for fluid. 30 tablet 2  . insulin NPH-regular Human (NOVOLIN 70/30) (70-30) 100 UNIT/ML injection Inject into the skin. Inject 20 in the morning and 20 in the evening    . nitroGLYCERIN (NITROSTAT) 0.4 MG SL tablet PLACE 1 TABLET UNDER TONGUE EVERY 5 MIN AS NEEDED FOR CHEST PAIN IF NO RELIEF IN15 MIN CALL 911 (MAX 3 TABS) 25 tablet 2  . clopidogrel (PLAVIX) 75 MG tablet Take 1 tablet (75 mg total) by mouth daily. 30 tablet 11   No current facility-administered medications for this visit.     Past Medical History:  Diagnosis Date  . Arthritis    "hands, back" (12/09/2013)  . Aspirin allergy   . CAD (coronary artery disease)    a. 03/2010 s/p CABG x 3 (LIMA->LAD, VG->OM, VG->Diag);  b. 9/15 PCI native LCX (2.75x16 & 2.75x8 Promus DES');  c. 02/2014 Cath: LM nl, LAD 80-90/100p, LCX patent stents, OM1 100, OM2 99ost, RCA 100p, VG->Diag 100, VG->OM nl, LIMA->LAD nl, EF 55-65%-->Med Rx; d. 10/2015 NSTEMI/Cath: stable anatomy, patent LCX stent, 2/3 patent  grafts as prev noted, EF 45%-->Med Rx w/ nitrate/ranexa.  . Carotid arterial disease (HCC)    a. 08/2015 U/S: RICA 40-59%, LICA 1-39%-->f/u 1 yr.  . Chronic stable angina (HCC)   . Diastolic dysfunction    a. 01/2014 Echo: EF 55%, Gr1 DD, triv MR.  . DVT (deep venous thrombosis) (HCC) 1970's   LLE  . HLD (hyperlipidemia)    statin intolerant  . Hypertensive heart disease   . Myocardial infarction (HCC) 03/04/2016   STEMI  . Obesity   . Orthostatic hypotension    previous Midodrine therapy  . Pneumonia 2013?  Marland Kitchen PONV (postoperative nausea and vomiting)   . PVD (peripheral vascular disease) (HCC)    a. prior balloon PTA to left leg in [place].  . Type II diabetes mellitus (HCC)     Past Surgical History:  Procedure Laterality Date  . ANGIOPLASTY / STENTING FEMORAL Left 03/14/2011  . APPENDECTOMY    . CARDIAC CATHETERIZATION  03/2010; 2013  . CARDIAC CATHETERIZATION N/A 11/05/2015   Procedure: LEFT HEART CATH AND CORS/GRAFTS ANGIOGRAPHY;  Surgeon: Antonieta Iba, MD;  Location: [facility] INVASIVE CV LAB;  Service: Cardiovascular;  Laterality: N/A;  . Carotid dopplers  03/2010   40-59% bilateral ICA stenosis  . CATARACT EXTRACTION W/ INTRAOCULAR LENS  IMPLANT, BILATERAL Bilateral   . CHOLECYSTECTOMY  2012  .  CORONARY ANGIOPLASTY WITH STENT PLACEMENT  12/09/2013   circumflex was stented 2.75 x 16 overlapping 2.75 x 8 promus drug-eluting stents  . CORONARY ARTERY BYPASS GRAFT  03/2010   CABG X3  . EYE SURGERY    . LEFT HEART CATHETERIZATION WITH CORONARY/GRAFT ANGIOGRAM N/A 11/21/2011   Procedure: LEFT HEART CATHETERIZATION WITH Isabel Caprice;  Surgeon: Kathleene Hazel, MD;  Location: [facility] CATH LAB;  Service: Cardiovascular;  Laterality: N/A;  . LEFT HEART CATHETERIZATION WITH CORONARY/GRAFT ANGIOGRAM N/A 12/09/2013   Procedure: LEFT HEART CATHETERIZATION WITH Isabel Caprice;  Surgeon: Corky Crafts, MD;  Location: [facility] CATH LAB;  Service: Cardiovascular;   Laterality: N/A;  . LEFT HEART CATHETERIZATION WITH CORONARY/GRAFT ANGIOGRAM N/A 03/13/2014   Procedure: LEFT HEART CATHETERIZATION WITH Isabel Caprice;  Surgeon: Peter M Swaziland, MD;  Location: [facility] CATH LAB;  Service: Cardiovascular;  Laterality: N/A;  . LOWER EXTREMITY ANGIOGRAPHY Left 05/04/2017   Procedure: LOWER EXTREMITY ANGIOGRAPHY;  Surgeon: Annice Needy, MD;  Location: [facility] INVASIVE CV LAB;  Service: Cardiovascular;  Laterality: Left;  . LOWER EXTREMITY ANGIOGRAPHY Right 03/19/2018   Procedure: LOWER EXTREMITY ANGIOGRAPHY;  Surgeon: Annice Needy, MD;  Location: [facility] INVASIVE CV LAB;  Service: Cardiovascular;  Laterality: Right;  . PERCUTANEOUS CORONARY STENT INTERVENTION (PCI-S)  12/09/2013   Procedure: PERCUTANEOUS CORONARY STENT INTERVENTION (PCI-S);  Surgeon: Corky Crafts, MD;  Location: [facility] CATH LAB;  Service: Cardiovascular;;  . REFRACTIVE SURGERY     laser  . RIGHT HEART CATHETERIZATION  11/21/2011   Procedure: RIGHT HEART CATH;  Surgeon: Kathleene Hazel, MD;  Location: [facility] CATH LAB;  Service: Cardiovascular;;  . TOE SURGERY Bilateral    titanium joints in bilateral great toes  . TONSILLECTOMY    . TUBAL LIGATION Bilateral   . VITRECTOMY Right 2015    Social History       Tobacco Use  . Smoking status: Never Smoker  . Smokeless tobacco: Never Used  Substance Use Topics  . Alcohol use: No  . Drug use: No    Family History      Family History  Problem Relation Age of Onset  . Esophageal cancer Mother   . Stroke Mother   . Heart attack Father    MI  . Heart attack Brother    CABG x 4   . Heart disease Brother    valve replacement         Allergies  Allergen Reactions  . Bee Venom Anaphylaxis  . Statins Other (See Comments)    Crippling  . Lisinopril Other (See Comments)    Fluctuations BP  . Tylenol [Acetaminophen] Other (See Comments)    Unknown  . Aspirin Hives  . Atorvastatin Other (See  Comments)    Arthralgias  . Codeine Hives and Nausea Only  . Cortisone Hives  . Demerol Hives  . Meperidine Hives and Nausea And Vomiting  . Metformin Other (See Comments)    Intolerance  . Prednisone Hives     REVIEW OF SYSTEMS(Negative unless checked)  Constitutional: [] ??Weight loss[] ??Fever[] ??Chills Cardiac:[] ??Chest pain[] ??Chest pressure[] ??Palpitations [] ??Shortness of breath when laying flat [] ??Shortness of breath at rest [] ??Shortness of breath with exertion. Vascular: [x] ??Pain in legs with walking[] ??Pain in legsat rest[] ??Pain in legs when laying flat [x] ??Claudication [] ??Pain in feet when walking [x] ??Pain in feet at rest [] ??Pain in feet when laying flat [] ??History of DVT [] ??Phlebitis [x] ??Swelling in legs [] ??Varicose veins [] ??Non-healing ulcers Pulmonary: [] ??Uses home oxygen [] ??Productive cough[] ??Hemoptysis [] ??Wheeze [] ??COPD [] ??Asthma Neurologic: [] ??Dizziness [] ??Blackouts [] ??Seizures [] ??History of stroke [] ??History of  TIA[] ??Aphasia [] ??Temporary blindness[] ??Dysphagia [] ??Weaknessor numbness in arms [] ??Weakness or numbnessin legs Musculoskeletal: [x] ??Arthritis [] ??Joint swelling [] ??Joint pain [] ??Low back pain Hematologic:[] ??Easy bruising[] ??Easy bleeding [] ??Hypercoagulable state [] ??Anemic  Gastrointestinal:[] ??Blood in stool[] ??Vomiting blood[] ??Gastroesophageal reflux/heartburn[] ??Abdominal pain Genitourinary: [] ??Chronic kidney disease [] ??Difficulturination [] ??Frequenturination [] ??Burning with urination[] ??Hematuria Skin: [] ??Rashes [] ??Ulcers [] ??Wounds Psychological: [] ??History of anxiety[] ??History of major depression.    Physical Examination  BP (!) 145/85 (BP Location: Left Arm)   Pulse 79   Resp 16   Ht 5\' 4"  (1.626 m)   Wt 207 lb 12.8 oz (94.3 kg)   BMI 35.67 kg/m  Gen:  WD/WN, NAD Head: /AT, No  temporalis wasting. Ear/Nose/Throat: Hearing grossly intact, nares w/o erythema or drainage Eyes: Conjunctiva clear. Sclera non-icteric Neck: Supple.  Trachea midline Pulmonary:  Good air movement, no use of accessory muscles.  Cardiac: RRR, no JVD Vascular:  Vessel Right Left  Radial Palpable Palpable                          PT  not palpable  1+ palpable  DP  2+ palpable  2+ palpable    Musculoskeletal: M/S 5/5 throughout.  No deformity or atrophy.  Capillary refill bilaterally good.  Trace lower extremity edema. Neurologic: Sensation grossly intact in extremities.  Symmetrical.  Speech is fluent.  Psychiatric: Judgment intact, Mood & affect appropriate for pt's clinical situation. Dermatologic: No rashes or ulcers noted.  No cellulitis or open wounds.       Labs Recent Results (from the past 2160 hour(s))  BUN     Status: None   Collection Time: 03/16/18 10:38 AM  Result Value Ref Range   BUN 18 8 - 23 mg/dL    Comment: Performed at [facility], [address] [place], [place] [address]  Creatinine, serum     Status: None   Collection Time: 03/16/18 10:38 AM  Result Value Ref Range   Creatinine, Ser 0.74 0.44 - 1.00 mg/dL   GFR calc non Af Amer >60 >60 mL/min   GFR calc Af Amer >60 >60 mL/min    Comment: Performed at [facility], [address] [place], [place] [address]  Glucose, capillary     Status: Abnormal   Collection Time: 03/19/18 10:45 AM  Result Value Ref Range   Glucose-Capillary 108 (H) 70 - 99 mg/dL  Glucose, capillary     Status: None   Collection Time: 03/19/18 12:25 PM  Result Value Ref Range   Glucose-Capillary 78 70 - 99 mg/dL  Glucose, capillary     Status: Abnormal   Collection Time: 03/19/18  1:42 PM  Result Value Ref Range   Glucose-Capillary 175 (H) 70 - 99 mg/dL    Radiology No results found.  Assessment/Plan Type II diabetes mellitus (HCC) blood glucose control important in reducing the progression of  atherosclerotic disease. Also, involved in wound healing. On appropriate medications.   Essential hypertension blood pressure control important in reducing the progression of atherosclerotic disease. On appropriate oral medications.  HLD (hyperlipidemia) lipid control important in reducing the progression of atherosclerotic disease. Intolerant to statin therapy  Atherosclerosis of native arteries of extremity with rest pain (HCC) Symptoms significantly improved after revascularization.  I suspect her episode of pain last week was neuropathic in nature as her perfusion appears to be pretty good.  Continue current medical regimen.  Recheck in 3 to 4 months with noninvasive studies.    Festus Barren, MD  05/04/2018 4:55 PM    This note was created with  Dragon medical transcription system.  Any errors from dictation are purely unintentional

## 2018-05-04 NOTE — Patient Instructions (Signed)
Peripheral Vascular Disease  Peripheral vascular disease (PVD) is a disease of the blood vessels that are not part of your heart and brain. A simple term for PVD is poor circulation. In most cases, PVD narrows the blood vessels that carry blood from your heart to the rest of your body. This can reduce the supply of blood to your arms, legs, and internal organs, like your stomach or kidneys. However, PVD most often affects a person's lower legs and feet. Without treatment, PVD tends to get worse. PVD can also lead to acute ischemic limb. This is when an arm or leg suddenly cannot get enough blood. This is a medical emergency. Follow these instructions at home: Lifestyle  Do not use any products that contain nicotine or tobacco, such as cigarettes and e-cigarettes. If you need help quitting, ask your doctor.  Lose weight if you are overweight. Or, stay at a healthy weight as told by your doctor.  Eat a diet that is low in fat and cholesterol. If you need help, ask your doctor.  Exercise regularly. Ask your doctor for activities that are right for you. General instructions  Take over-the-counter and prescription medicines only as told by your doctor.  Take good care of your feet: ? Wear comfortable shoes that fit well. ? Check your feet often for any cuts or sores.  Keep all follow-up visits as told by your doctor This is important. Contact a doctor if:  You have cramps in your legs when you walk.  You have leg pain when you are at rest.  You have coldness in a leg or foot.  Your skin changes.  You are unable to get or have an erection (erectile dysfunction).  You have cuts or sores on your feet that do not heal. Get help right away if:  Your arm or leg turns cold, numb, and blue.  Your arms or legs become red, warm, swollen, painful, or numb.  You have chest pain.  You have trouble breathing.  You suddenly have weakness in your face, arm, or leg.  You become very  confused or you cannot speak.  You suddenly have a very bad headache.  You suddenly cannot see. Summary  Peripheral vascular disease (PVD) is a disease of the blood vessels.  A simple term for PVD is poor circulation. Without treatment, PVD tends to get worse.  Treatment may include exercise, low fat and low cholesterol diet, and quitting smoking. This information is not intended to replace advice given to you by your health care provider. Make sure you discuss any questions you have with your health care provider. Document Released: 05/25/2009 Document Revised: 04/07/2016 Document Reviewed: 04/07/2016 Elsevier Interactive Patient Education  2019 Elsevier Inc.  

## 2018-05-04 NOTE — Assessment & Plan Note (Signed)
Symptoms significantly improved after revascularization.  I suspect her episode of pain last week was neuropathic in nature as her perfusion appears to be pretty good.  Continue current medical regimen.  Recheck in 3 to 4 months with noninvasive studies.

## 2018-06-28 ENCOUNTER — Telehealth: Payer: Self-pay | Admitting: *Deleted

## 2018-06-28 NOTE — Telephone Encounter (Signed)
Spoke with patient to cancel 07/02/18 appointment.  Received verbal consent to [employer] medications to her home.  Instructions to not take any additional drug from remaining supplies and not to start new medication which will be delivered to her until we follow up by phone to determine that med was received in appropriate condition. Conducted visit [id number] by phone.

## 2018-07-24 ENCOUNTER — Other Ambulatory Visit (INDEPENDENT_AMBULATORY_CARE_PROVIDER_SITE_OTHER): Payer: Self-pay | Admitting: Vascular Surgery

## 2018-09-04 ENCOUNTER — Ambulatory Visit (INDEPENDENT_AMBULATORY_CARE_PROVIDER_SITE_OTHER): Payer: Medicare HMO | Admitting: Vascular Surgery

## 2018-09-04 ENCOUNTER — Encounter (INDEPENDENT_AMBULATORY_CARE_PROVIDER_SITE_OTHER): Payer: Medicare HMO

## 2018-10-05 ENCOUNTER — Encounter (INDEPENDENT_AMBULATORY_CARE_PROVIDER_SITE_OTHER): Payer: Medicare HMO

## 2018-10-05 ENCOUNTER — Ambulatory Visit (INDEPENDENT_AMBULATORY_CARE_PROVIDER_SITE_OTHER): Payer: Medicare HMO | Admitting: Vascular Surgery

## 2018-10-12 ENCOUNTER — Telehealth: Payer: Self-pay | Admitting: Cardiology

## 2018-10-12 NOTE — Telephone Encounter (Signed)
New message   Pt c/o BP issue: STAT if pt c/o blurred vision, one-sided weakness or slurred speech  1. What are your last 5 BP readings? 180/74  221/110 221/114  2. Are you having any other symptoms (ex. Dizziness, headache, blurred vision, passed out)? No   3. What is your BP issue? Blood pressure is elevated

## 2018-10-12 NOTE — Telephone Encounter (Signed)
Spoke with Dr. Harrell Gave (DOD). As long as she is symptomatic she can see [name] NP Monday Aug 3,2020 at 3:45 PM. Informed her to go to the ED if headache, Slurred speech, blurred vision or one sided weakness occurs. Patient verbalized understanding.

## 2018-10-12 NOTE — Telephone Encounter (Signed)
Telephone call to patient. States has been having elevated BP's the past week. This am it was 171/91 HR 73, last PM 207/102. Denies CP,pressure SHOB,swelling. Only takes Plavix and Lasix 20 mg PRN swelling in which she says she hasn't taken in months. No other cardiac meds. Please advise.

## 2018-10-14 NOTE — Progress Notes (Deleted)
Cardiology Office Note   Date:  10/14/2018   ID:  [name], DOB December 18, 1945, MRN [id number]  PCP:  [name], MD  Cardiologist:  Dr. Antoine Poche  No chief complaint on file.    History of Present Illness: [name] is a 73 y.o. female who presents for ongoing assessment and management of coronary artery disease with extensive history beginning in January 2012, leading to CABG x3 with LIMA to LAD, SVG to OM, SVG to diagonal.  Most recent cardiac catheterization was in August 2017 in the setting of NSTEMI which revealed patent left circumflex stent, 2 of 3 patent grafts, with EF of 45%, to be treated medically.  Other history includes carotid artery disease with most recent ultrasound in 2017 revealing R ICA 40% to 59%, with LICA 1 to 39%, chronic deep vein thrombosis of the left lower leg, chronic stable angina, hypertension with hypertensive heart disease, and hyperlipidemia.  She is also followed in [place] due to peripheral vascular disease and has had a prior balloon PTA of the left leg December 2012..  On most recent appointment with Dr. Antoine Poche dated 11/30/2017, she was without complaint.  She was stable from a cardiac standpoint.  He noted that she was a study participant for CLEAR study with bempedoic acid 180 mcg daily which is a non-statin alternative verses placebo.     Past Medical History:  Diagnosis Date  . Arthritis    "hands, back" (12/09/2013)  . Aspirin allergy   . CAD (coronary artery disease)    a. 03/2010 s/p CABG x 3 (LIMA->LAD, VG->OM, VG->Diag);  b. 9/15 PCI native LCX (2.75x16 & 2.75x8 Promus DES');  c. 02/2014 Cath: LM nl, LAD 80-90/100p, LCX patent stents, OM1 100, OM2 99ost, RCA 100p, VG->Diag 100, VG->OM nl, LIMA->LAD nl, EF 55-65%-->Med Rx; d. 10/2015 NSTEMI/Cath: stable anatomy, patent LCX stent, 2/3 patent grafts as prev noted, EF 45%-->Med Rx w/ nitrate/ranexa.  . Carotid arterial disease (HCC)    a. 08/2015 U/S: RICA 40-59%, LICA 1-39%-->f/u 1  yr.  . Chronic stable angina (HCC)   . Diastolic dysfunction    a. 01/2014 Echo: EF 55%, Gr1 DD, triv MR.  . DVT (deep venous thrombosis) (HCC) 1970's   LLE  . HLD (hyperlipidemia)    statin intolerant  . Hypertensive heart disease   . Myocardial infarction (HCC) 03/04/2016   STEMI  . Obesity   . Orthostatic hypotension    previous Midodrine therapy  . Pneumonia 2013?  Marland Kitchen PONV (postoperative nausea and vomiting)   . PVD (peripheral vascular disease) (HCC)    a. prior balloon PTA to left leg in [place].  . Type II diabetes mellitus (HCC)     Past Surgical History:  Procedure Laterality Date  . ANGIOPLASTY / STENTING FEMORAL Left 03/14/2011  . APPENDECTOMY    . CARDIAC CATHETERIZATION  03/2010; 2013  . CARDIAC CATHETERIZATION N/A 11/05/2015   Procedure: LEFT HEART CATH AND CORS/GRAFTS ANGIOGRAPHY;  Surgeon: Antonieta Iba, MD;  Location: [facility] INVASIVE CV LAB;  Service: Cardiovascular;  Laterality: N/A;  . Carotid dopplers  03/2010   40-59% bilateral ICA stenosis  . CATARACT EXTRACTION W/ INTRAOCULAR LENS  IMPLANT, BILATERAL Bilateral   . CHOLECYSTECTOMY  2012  . CORONARY ANGIOPLASTY WITH STENT PLACEMENT  12/09/2013   circumflex was stented 2.75 x 16 overlapping 2.75 x 8 promus drug-eluting stents  . CORONARY ARTERY BYPASS GRAFT  03/2010   CABG X3  . EYE SURGERY    . LEFT HEART CATHETERIZATION WITH CORONARY/GRAFT  ANGIOGRAM N/A 11/21/2011   Procedure: LEFT HEART CATHETERIZATION WITH Isabel CapriceORONARY/GRAFT ANGIOGRAM;  Surgeon: Kathleene Hazelhristopher D McAlhany, MD;  [facility];  Service: Cardiovascular;  Laterality: N/A;  . LEFT HEART CATHETERIZATION WITH CORONARY/GRAFT ANGIOGRAM N/A 12/09/2013   Procedure: LEFT HEART CATHETERIZATION WITH Isabel CapriceORONARY/GRAFT ANGIOGRAM;  Surgeon: Corky CraftsJayadeep S Varanasi, MD;  [facility];  Service: Cardiovascular;  Laterality: N/A;  . LEFT HEART CATHETERIZATION WITH CORONARY/GRAFT ANGIOGRAM N/A 03/13/2014   Procedure: LEFT HEART CATHETERIZATION WITH  Isabel CapriceORONARY/GRAFT ANGIOGRAM;  Surgeon: Peter M SwazilandJordan, MD;  [facility];  Service: Cardiovascular;  Laterality: N/A;  . LOWER EXTREMITY ANGIOGRAPHY Left 05/04/2017   Procedure: LOWER EXTREMITY ANGIOGRAPHY;  Surgeon: Annice Needyew, Jason S, MD;  Location: [facility] INVASIVE CV LAB;  Service: Cardiovascular;  Laterality: Left;  . LOWER EXTREMITY ANGIOGRAPHY Right 03/19/2018   Procedure: LOWER EXTREMITY ANGIOGRAPHY;  Surgeon: Annice Needyew, Jason S, MD;  Location: [facility] INVASIVE CV LAB;  Service: Cardiovascular;  Laterality: Right;  . PERCUTANEOUS CORONARY STENT INTERVENTION (PCI-S)  12/09/2013   Procedure: PERCUTANEOUS CORONARY STENT INTERVENTION (PCI-S);  Surgeon: Corky CraftsJayadeep S Varanasi, MD;  [facility];  Service: Cardiovascular;;  . REFRACTIVE SURGERY     laser  . RIGHT HEART CATHETERIZATION  11/21/2011   Procedure: RIGHT HEART CATH;  Surgeon: Kathleene Hazelhristopher D McAlhany, MD;  [facility];  Service: Cardiovascular;;  . TOE SURGERY Bilateral    titanium joints in bilateral great toes  . TONSILLECTOMY    . TUBAL LIGATION Bilateral   . VITRECTOMY Right 2015     Current Outpatient Medications  Medication Sig Dispense Refill  . clopidogrel (PLAVIX) 75 MG tablet Take 1 tablet (75 mg total) by mouth daily. 30 tablet 11  . clopidogrel (PLAVIX) 75 MG tablet Take 1 tablet (75 mg total) by mouth daily. 30 tablet 11  . clopidogrel (PLAVIX) 75 MG tablet TAKE ONE TABLET BY MOUTH DAILY 30 tablet 5  . furosemide (LASIX) 20 MG tablet Take 1 tablet (20 mg total) by mouth daily as needed for fluid. 30 tablet 2  . insulin NPH-regular Human (NOVOLIN 70/30) (70-30) 100 UNIT/ML injection Inject into the skin. Inject 20 in the morning and 20 in the evening    . nitroGLYCERIN (NITROSTAT) 0.4 MG SL tablet PLACE 1 TABLET UNDER TONGUE EVERY 5 MIN AS NEEDED FOR CHEST PAIN IF NO RELIEF IN15 MIN CALL 911 (MAX 3 TABS) 25 tablet 2   No current facility-administered medications for this visit.     Allergies:   Bee venom, Statins,  Eggs or egg-derived products, Lisinopril, Tylenol [acetaminophen], Aspirin, Atorvastatin, Codeine, Cortisone, Demerol, Meperidine, Metformin, and Prednisone    Social History:  The patient  reports that she has never smoked. She has never used smokeless tobacco. She reports that she does not drink alcohol or use drugs.   Family History:  The patient's family history includes Esophageal cancer in her mother; Heart attack in her brother and father; Heart disease in her brother; Stroke in her mother.    ROS: All other systems are reviewed and negative. Unless otherwise mentioned in H&P    PHYSICAL EXAM: VS:  There were no vitals taken for this visit. , BMI There is no height or weight on file to calculate BMI. GEN: Well nourished, well developed, in no acute distress HEENT: normal Neck: no JVD, carotid bruits, or masses Cardiac: ***RRR; no murmurs, rubs, or gallops,no edema  Respiratory:  Clear to auscultation bilaterally, normal work of breathing GI: soft, nontender, nondistended, + BS MS: no deformity  or atrophy Skin: warm and dry, no rash Neuro:  Strength and sensation are intact Psych: euthymic mood, full affect   EKG:  EKG {ACTION; IS/IS [id number]} ordered today. The ekg ordered today demonstrates ***   Recent Labs: 03/16/2018: BUN 18; Creatinine, Ser 0.74    Lipid Panel    Component Value Date/Time   CHOL 289 (H) 11/20/2011 1159   TRIG 240 (H) 10/29/2014 1014   HDL 48 10/29/2014 1014   CHOLHDL 5.4 (H) 10/29/2014 1014   CHOLHDL 5.7 11/20/2011 1159   VLDL 40 11/20/2011 1159   LDLCALC 198 (H) 11/20/2011 1159   LDLDIRECT 155 (H) 10/29/2014 1014      Wt Readings from Last 3 Encounters:  05/04/18 207 lb 12.8 oz (94.3 kg)  03/16/18 204 lb (92.5 kg)  03/02/18 204 lb (92.5 kg)      Other studies Reviewed: Additional studies/ records that were reviewed today include: ***. Review of the above records demonstrates: ***   ASSESSMENT AND PLAN:  1.  ***   Current  medicines are reviewed at length with the patient today.    Labs/ tests ordered today include: *** Phill Myron. [place] Pugh, ANP, AACC   10/14/2018 1:49 PM    [address] Office [phone] Fax [phone]

## 2018-10-15 ENCOUNTER — Ambulatory Visit: Payer: Medicare HMO | Admitting: Adult Health

## 2018-11-26 ENCOUNTER — Encounter (HOSPITAL_COMMUNITY): Payer: Medicare HMO

## 2018-12-04 ENCOUNTER — Ambulatory Visit (INDEPENDENT_AMBULATORY_CARE_PROVIDER_SITE_OTHER): Payer: Medicare HMO

## 2018-12-04 ENCOUNTER — Other Ambulatory Visit: Payer: Self-pay

## 2018-12-04 ENCOUNTER — Other Ambulatory Visit: Payer: Self-pay | Admitting: Cardiology

## 2018-12-04 DIAGNOSIS — I6523 Occlusion and stenosis of bilateral carotid arteries: Secondary | ICD-10-CM

## 2018-12-06 NOTE — Progress Notes (Signed)
HPI The patient presents for followup of coronary disease.  She has an extensive history of CAD.  She has been intolerant of many medications.  She took herself off of most meds.  Since I last saw her she had called with increased BP.  This was 3 days or so in late July.  She had to take sublingual nitroglycerin and things improved.  She was not sure why this happened but she is had normal blood pressure since then.  She is done very well.  Her lipids are better.  Her sugar is better. The patient denies any new symptoms such as chest discomfort, neck or arm discomfort. There has been no new shortness of breath, PND or orthopnea. There have been no reported palpitations, presyncope or syncope.    Allergies  Allergen Reactions  . Bee Venom Anaphylaxis  . Statins Other (See Comments)    Crippling  . Lisinopril Other (See Comments)    Fluctuations BP  . Tylenol [Acetaminophen] Diarrhea and Hypertension  . Aspirin Hives  . Atorvastatin Other (See Comments)    Arthralgias  . Codeine Hives and Nausea Only  . Cortisone Hives  . Demerol Hives  . Meperidine Hives and Nausea And Vomiting  . Metformin Other (See Comments)    Intolerance  . Prednisone Hives    Current Outpatient Medications  Medication Sig Dispense Refill  . clopidogrel (PLAVIX) 75 MG tablet Take 1 tablet (75 mg total) by mouth daily. 30 tablet 11  . furosemide (LASIX) 20 MG tablet Take 1 tablet (20 mg total) by mouth daily as needed for fluid. 30 tablet 2  . insulin NPH-regular Human (NOVOLIN 70/30) (70-30) 100 UNIT/ML injection Inject into the skin. Inject 20 in the morning and 20 in the evening    . nitroGLYCERIN (NITROSTAT) 0.4 MG SL tablet PLACE 1 TABLET UNDER TONGUE EVERY 5 MIN AS NEEDED FOR CHEST PAIN IF NO RELIEF IN15 MIN CALL 911 (MAX 3 TABS) 25 tablet 2   No current facility-administered medications for this visit.     Past Medical History:  Diagnosis Date  . Arthritis    "hands, back" (12/09/2013)  .  Aspirin allergy   . CAD (coronary artery disease)    a. 03/2010 s/p CABG x 3 (LIMA->LAD, VG->OM, VG->Diag);  b. 9/15 PCI native LCX (2.75x16 & 2.75x8 Promus DES');  c. 02/2014 Cath: LM nl, LAD 80-90/100p, LCX patent stents, OM1 100, OM2 99ost, RCA 100p, VG->Diag 100, VG->OM nl, LIMA->LAD nl, EF 55-65%-->Med Rx; d. 10/2015 NSTEMI/Cath: stable anatomy, patent LCX stent, 2/3 patent grafts as prev noted, EF 45%-->Med Rx w/ nitrate/ranexa.  . Carotid arterial disease ([place])    a. 08/2015 U/S: RICA 85-46%, LICA 2-70%-->J/J 1 yr.  . Chronic stable angina ([place])   . Diastolic dysfunction    a. 01/2014 Echo: EF 55%, Gr1 DD, triv MR.  . DVT (deep venous thrombosis) (HCC) 1970's   LLE  . HLD (hyperlipidemia)    statin intolerant  . Hypertensive heart disease   . Myocardial infarction ([place]) 03/04/2016   STEMI  . Obesity   . Orthostatic hypotension    previous Midodrine therapy  . Pneumonia 2013?  Marland Kitchen PONV (postoperative nausea and vomiting)   . PVD (peripheral vascular disease) ([place])    a. prior balloon PTA to left leg in [place].  . Type II diabetes mellitus ([place])     Past Surgical History:  Procedure Laterality Date  . ANGIOPLASTY / STENTING FEMORAL Left 03/14/2011  . APPENDECTOMY    .  CARDIAC CATHETERIZATION  03/2010; 2013  . CARDIAC CATHETERIZATION N/A 11/05/2015   Procedure: LEFT HEART CATH AND CORS/GRAFTS ANGIOGRAPHY;  Surgeon: Antonieta Iba, MD;  Location: [facility] INVASIVE CV LAB;  Service: Cardiovascular;  Laterality: N/A;  . Carotid dopplers  03/2010   40-59% bilateral ICA stenosis  . CATARACT EXTRACTION W/ INTRAOCULAR LENS  IMPLANT, BILATERAL Bilateral   . CHOLECYSTECTOMY  2012  . CORONARY ANGIOPLASTY WITH STENT PLACEMENT  12/09/2013   circumflex was stented 2.75 x 16 overlapping 2.75 x 8 promus drug-eluting stents  . CORONARY ARTERY BYPASS GRAFT  03/2010   CABG X3  . EYE SURGERY    . LEFT HEART CATHETERIZATION WITH CORONARY/GRAFT ANGIOGRAM N/A 11/21/2011   Procedure: LEFT HEART  CATHETERIZATION WITH Isabel Caprice;  Surgeon: Kathleene Hazel, MD;  [facility];  Service: Cardiovascular;  Laterality: N/A;  . LEFT HEART CATHETERIZATION WITH CORONARY/GRAFT ANGIOGRAM N/A 12/09/2013   Procedure: LEFT HEART CATHETERIZATION WITH Isabel Caprice;  Surgeon: Corky Crafts, MD;  Location[facility] LAB;  Service: Cardiovascular;  Laterality: N/A;  . LEFT HEART CATHETERIZATION WITH CORONARY/GRAFT ANGIOGRAM N/A 03/13/2014   Procedure: LEFT HEART CATHETERIZATION WITH Isabel Caprice;  Surgeon: Peter M Swaziland, MD;  Location: [facility] CATH LAB;  Service: Cardiovascular;  Laterality: N/A;  . LOWER EXTREMITY ANGIOGRAPHY Left 05/04/2017   Procedure: LOWER EXTREMITY ANGIOGRAPHY;  Surgeon: Annice Needy, MD;  Location: [facility] INVASIVE CV LAB;  Service: Cardiovascular;  Laterality: Left;  . LOWER EXTREMITY ANGIOGRAPHY Right 03/19/2018   Procedure: LOWER EXTREMITY ANGIOGRAPHY;  Surgeon: Annice Needy, MD;  Location: [facility] INVASIVE CV LAB;  Service: Cardiovascular;  Laterality: Right;  . PERCUTANEOUS CORONARY STENT INTERVENTION (PCI-S)  12/09/2013   Procedure: PERCUTANEOUS CORONARY STENT INTERVENTION (PCI-S);  Surgeon: Corky Crafts, MD;  [facility] LAB;  Service: Cardiovascular;;  . REFRACTIVE SURGERY     laser  . RIGHT HEART CATHETERIZATION  11/21/2011   Procedure: RIGHT HEART CATH;  Surgeon: Kathleene Hazel, MD;  [facility] C[facility] LAB;  Service: Cardiovascular;;  . TOE SURGERY Bilateral    titanium joints in bilateral great toes  . TONSILLECTOMY    . TUBAL LIGATION Bilateral   . VITRECTOMY Right 2015    ROS:  As stated in the HPI and negative for all other systems.  PHYSICAL EXAM BP (!) 146/72   Pulse 77   Temp 98.4 F (36.9 C) (Temporal)   Ht 5\' 5"  (1.651 m)   Wt 204 lb 9.6 oz (92.8 kg)   SpO2 98%   BMI 34.05 kg/m   GENERAL:  Well appearing NECK:  No jugular venous distention, waveform within normal limits, carotid upstroke  brisk and symmetric, no bruits, no thyromegaly LUNGS:  Clear to auscultation bilaterally CHEST:  Unremarkable HEART:  PMI not displaced or sustained,S1 and S2 within normal limits, no S3, no S4, no clicks, no rubs, no murmurs ABD:  Flat, positive bowel sounds normal in frequency in pitch, no bruits, no rebound, no guarding, no midline pulsatile mass, no hepatomegaly, no splenomegaly EXT:  2 plus pulses upper and decreased dorsalis pedis and posterior tibialis bilateral, no edema, no cyanosis no clubbing  EKG: Patient refused  ASSESSMENT AND PLAN   CAD - The patient has no new sypmtoms.  No further cardiovascular testing is indicated.  We will continue with aggressive risk reduction and meds as listed.  CAROTID ARTERY STENOSIS, BILATERAL -  She had 40 - 59% bilateral stenosis in Sept.  .  I will follow in one year.  HYPERLIPIDEMIA -  She is on the CLEAR study with bempedoic acid 180mg /day, a non-statin alternative, versus placebo.  LDL was 120.     HTN - Her BP is controlled at home.he is very slightly elevated today but he has been unable to tolerate any medicines.   DM - She reports that the A1C is 6.6 in July.  This is much improved.

## 2018-12-07 ENCOUNTER — Encounter: Payer: Self-pay | Admitting: Cardiology

## 2018-12-07 ENCOUNTER — Ambulatory Visit: Payer: Medicare HMO | Admitting: Cardiology

## 2018-12-07 ENCOUNTER — Other Ambulatory Visit: Payer: Self-pay

## 2018-12-07 VITALS — BP 146/72 | HR 77 | Temp 98.4°F | Ht 65.0 in | Wt 204.6 lb

## 2018-12-07 DIAGNOSIS — I6523 Occlusion and stenosis of bilateral carotid arteries: Secondary | ICD-10-CM

## 2018-12-07 DIAGNOSIS — I10 Essential (primary) hypertension: Secondary | ICD-10-CM | POA: Diagnosis not present

## 2018-12-07 DIAGNOSIS — I2510 Atherosclerotic heart disease of native coronary artery without angina pectoris: Secondary | ICD-10-CM | POA: Diagnosis not present

## 2018-12-07 NOTE — Patient Instructions (Signed)
Medication Instructions:  Your physician recommends that you continue on your current medications as directed. Please refer to the Current Medication list given to you today.  If you need a refill on your cardiac medications before your next appointment, please call your pharmacy.   Lab work: NONE  Testing/Procedures: NONE  Follow-Up: At [facility], you and your health needs are our priority.  As part of our continuing mission to provide you with exceptional heart care, we have created designated Provider Care Teams.  These Care Teams include your primary Cardiologist (physician) and Advanced Practice Providers (APPs -  Physician Assistants and Nurse Practitioners) who all work together to provide you with the care you need, when you need it. You will need a follow up appointment in 1 years.  Please call our office 2 months in advance to schedule this appointment.  You may see Dr. Percival Spanish or one of the following Advanced Practice Providers on your designated Care Team:   Rosaria Ferries, PA-C Jory Sims, DNP, ANP

## 2019-04-08 ENCOUNTER — Telehealth: Payer: Self-pay | Admitting: *Deleted

## 2019-04-08 NOTE — Telephone Encounter (Signed)
Spoke with patient for visit [phone] in the Clear research study.  No aes or saes to report.  She has expressed that she no longer wants to take any additional study medication.  Will be ok with every 3 month phone calls to check on her.

## 2019-05-06 ENCOUNTER — Telehealth: Payer: Self-pay | Admitting: *Deleted

## 2019-05-06 DIAGNOSIS — Z006 Encounter for examination for normal comparison and control in clinical research program: Secondary | ICD-10-CM

## 2019-05-06 NOTE — Telephone Encounter (Signed)
Chart check for clear research study.  She has returned all bottles and is continuing in study via phone calls.

## 2019-07-22 ENCOUNTER — Telehealth: Payer: Self-pay | Admitting: *Deleted

## 2019-07-22 NOTE — Telephone Encounter (Signed)
Called patient for T16, M42 Visit for clear study. No AE's/SAE's to report. Reminded her of next phone call from [place] in about 3 months.

## 2019-10-11 ENCOUNTER — Telehealth: Payer: Self-pay | Admitting: *Deleted

## 2019-10-11 NOTE — Telephone Encounter (Signed)
Completed telephone call/medical record check for CLEAR visit T17-M45. No AE's/SAE's to report. Medication changes have been updated. Next call will be in about 3 months.

## 2019-12-29 DIAGNOSIS — I6523 Occlusion and stenosis of bilateral carotid arteries: Secondary | ICD-10-CM | POA: Insufficient documentation

## 2019-12-29 NOTE — Progress Notes (Deleted)
Cardiology Office Note   Date:  12/29/2019   ID:  [name], DOB [birth year], MRN [id number]  PCP:  Kristy Ivan, MD  Cardiologist:   No primary care provider on file. Referring:  ***  No chief complaint on file.     History of Present Illness: [name] is a 74 y.o. female who presents for followup of coronary disease.  She has an extensive history of CAD.  She has been intolerant of many medications.  She took herself off of most meds.  Since I last saw her ***  *** she had called with increased BP.  This was 3 days or so in late July.  She had to take sublingual nitroglycerin and things improved.  She was not sure why this happened but she is had normal blood pressure since then.  She is done very well.  Her lipids are better.  Her sugar is better. The patient denies any new symptoms such as chest discomfort, neck or arm discomfort. There has been no new shortness of breath, PND or orthopnea. There have been no reported palpitations, presyncope or syncope.     Past Medical History:  Diagnosis Date  . Arthritis    "hands, back" (12/09/2013)  . Aspirin allergy   . CAD (coronary artery disease)    a. 03/2010 s/p CABG x 3 (LIMA->LAD, VG->OM, VG->Diag);  b. 9/15 PCI native LCX (2.75x16 & 2.75x8 Promus DES');  c. 02/2014 Cath: LM nl, LAD 80-90/100p, LCX patent stents, OM1 100, OM2 99ost, RCA 100p, VG->Diag 100, VG->OM nl, LIMA->LAD nl, EF 55-65%-->Med Rx; d. 10/2015 NSTEMI/Cath: stable anatomy, patent LCX stent, 2/3 patent grafts as prev noted, EF 45%-->Med Rx w/ nitrate/ranexa.  . Carotid arterial disease (HCC)    a. 08/2015 U/S: RICA 40-59%, LICA 1-39%-->f/u 1 yr.  . Chronic stable angina (HCC)   . Diastolic dysfunction    a. 01/2014 Echo: EF 55%, Gr1 DD, triv MR.  . DVT (deep venous thrombosis) (HCC) 1970's   LLE  . HLD (hyperlipidemia)    statin intolerant  . Hypertensive heart disease   . Myocardial infarction (HCC) 03/04/2016   STEMI  . Obesity   .  Orthostatic hypotension    previous Midodrine therapy  . Pneumonia 2013?  Marland Kitchen PONV (postoperative nausea and vomiting)   . PVD (peripheral vascular disease) (HCC)    a. prior balloon PTA to left leg in [place].  . Type II diabetes mellitus (HCC)     Past Surgical History:  Procedure Laterality Date  . ANGIOPLASTY / STENTING FEMORAL Left 03/14/2011  . APPENDECTOMY    . CARDIAC CATHETERIZATION  03/2010; 2013  . CARDIAC CATHETERIZATION N/A 11/05/2015   Procedure: LEFT HEART CATH AND CORS/GRAFTS ANGIOGRAPHY;  Surgeon: Antonieta Iba, MD;  Location: [facility] INVASIVE CV LAB;  Service: Cardiovascular;  Laterality: N/A;  . Carotid dopplers  03/2010   40-59% bilateral ICA stenosis  . CATARACT EXTRACTION W/ INTRAOCULAR LENS  IMPLANT, BILATERAL Bilateral   . CHOLECYSTECTOMY  2012  . CORONARY ANGIOPLASTY WITH STENT PLACEMENT  12/09/2013   circumflex was stented 2.75 x 16 overlapping 2.75 x 8 promus drug-eluting stents  . CORONARY ARTERY BYPASS GRAFT  03/2010   CABG X3  . EYE SURGERY    . LEFT HEART CATHETERIZATION WITH CORONARY/GRAFT ANGIOGRAM N/A 11/21/2011   Procedure: LEFT HEART CATHETERIZATION WITH Isabel Caprice;  Surgeon: Kathleene Hazel, MD;  Location: [facility] CATH LAB;  Service: Cardiovascular;  Laterality: N/A;  . LEFT HEART CATHETERIZATION WITH CORONARY/GRAFT  ANGIOGRAM N/A 12/09/2013   Procedure: LEFT HEART CATHETERIZATION WITH Isabel Caprice;  Surgeon: Corky Crafts, MD;  Location: [facility] CATH LAB;  Service: Cardiovascular;  Laterality: N/A;  . LEFT HEART CATHETERIZATION WITH CORONARY/GRAFT ANGIOGRAM N/A 03/13/2014   Procedure: LEFT HEART CATHETERIZATION WITH Isabel Caprice;  Surgeon: Peter M Swaziland, MD;  Location: [facility] CATH LAB;  Service: Cardiovascular;  Laterality: N/A;  . LOWER EXTREMITY ANGIOGRAPHY Left 05/04/2017   Procedure: LOWER EXTREMITY ANGIOGRAPHY;  Surgeon: Annice Needy, MD;  Location: [facility] INVASIVE CV LAB;  Service: Cardiovascular;  Laterality:  Left;  . LOWER EXTREMITY ANGIOGRAPHY Right 03/19/2018   Procedure: LOWER EXTREMITY ANGIOGRAPHY;  Surgeon: Annice Needy, MD;  Location: [facility] INVASIVE CV LAB;  Service: Cardiovascular;  Laterality: Right;  . PERCUTANEOUS CORONARY STENT INTERVENTION (PCI-S)  12/09/2013   Procedure: PERCUTANEOUS CORONARY STENT INTERVENTION (PCI-S);  Surgeon: Corky Crafts, MD;  Location: [facility] CATH LAB;  Service: Cardiovascular;;  . REFRACTIVE SURGERY     laser  . RIGHT HEART CATHETERIZATION  11/21/2011   Procedure: RIGHT HEART CATH;  Surgeon: Kathleene Hazel, MD;  Location: [facility] CATH LAB;  Service: Cardiovascular;;  . TOE SURGERY Bilateral    titanium joints in bilateral great toes  . TONSILLECTOMY    . TUBAL LIGATION Bilateral   . VITRECTOMY Right 2015     Current Outpatient Medications  Medication Sig Dispense Refill  . clopidogrel (PLAVIX) 75 MG tablet Take 1 tablet (75 mg total) by mouth daily. 30 tablet 11  . furosemide (LASIX) 20 MG tablet Take 1 tablet (20 mg total) by mouth daily as needed for fluid. 30 tablet 2  . insulin NPH-regular Human (NOVOLIN 70/30) (70-30) 100 UNIT/ML injection Inject into the skin. Inject 20 in the morning and 20 in the evening    . nitroGLYCERIN (NITROSTAT) 0.4 MG SL tablet PLACE 1 TABLET UNDER TONGUE EVERY 5 MIN AS NEEDED FOR CHEST PAIN IF NO RELIEF IN15 MIN CALL 911 (MAX 3 TABS) 25 tablet 2   No current facility-administered medications for this visit.    Allergies:   Bee venom, Statins, Lisinopril, Tylenol [acetaminophen], Aspirin, Atorvastatin, Codeine, Cortisone, Demerol, Meperidine, Metformin, and Prednisone   ROS:  Please see the history of present illness.   Otherwise, review of systems are positive for {NONE DEFAULTED:18576::"none"}.   All other systems are reviewed and negative.    PHYSICAL EXAM: VS:  There were no vitals taken for this visit. , BMI There is no height or weight on file to calculate BMI. GENERAL:  Well appearing NECK:  No jugular venous  distention, waveform within normal limits, carotid upstroke brisk and symmetric, no bruits, no thyromegaly LUNGS:  Clear to auscultation bilaterally CHEST:  Unremarkable HEART:  PMI not displaced or sustained,S1 and S2 within normal limits, no S3, no S4, no clicks, no rubs, *** murmurs ABD:  Flat, positive bowel sounds normal in frequency in pitch, no bruits, no rebound, no guarding, no midline pulsatile mass, no hepatomegaly, no splenomegaly EXT:  2 plus pulses throughout, no edema, no cyanosis no clubbing     ***GENERAL:  Well appearing HEENT:  Pupils equal round and reactive, fundi not visualized, oral mucosa unremarkable NECK:  No jugular venous distention, waveform within normal limits, carotid upstroke brisk and symmetric, no bruits, no thyromegaly LYMPHATICS:  No cervical, inguinal adenopathy LUNGS:  Clear to auscultation bilaterally BACK:  No CVA tenderness CHEST:  Unremarkable HEART:  PMI not displaced or sustained,S1 and S2 within normal limits, no S3, no S4, no clicks, no  rubs, *** murmurs ABD:  Flat, positive bowel sounds normal in frequency in pitch, no bruits, no rebound, no guarding, no midline pulsatile mass, no hepatomegaly, no splenomegaly EXT:  2 plus pulses throughout, no edema, no cyanosis no clubbing SKIN:  No rashes no nodules NEURO:  Cranial nerves II through XII grossly intact, motor grossly intact throughout PSYCH:  Cognitively intact, oriented to person place and time    EKG:  EKG {ACTION; IS/IS ZOX:09604540} ordered today. The ekg ordered today demonstrates ***   Recent Labs: No results found for requested labs within last 8760 hours.    Lipid Panel    Component Value Date/Time   CHOL 289 (H) 11/20/2011 1159   TRIG 240 (H) 10/29/2014 1014   HDL 48 10/29/2014 1014   CHOLHDL 5.4 (H) 10/29/2014 1014   CHOLHDL 5.7 11/20/2011 1159   VLDL 40 11/20/2011 1159   LDLCALC 198 (H) 11/20/2011 1159   LDLDIRECT 155 (H) 10/29/2014 1014      Wt Readings from  Last 3 Encounters:  12/07/18 204 lb 9.6 oz (92.8 kg)  05/04/18 207 lb 12.8 oz (94.3 kg)  03/16/18 204 lb (92.5 kg)      Other studies Reviewed: Additional studies/ records that were reviewed today include: ***. Review of the above records demonstrates:  Please see elsewhere in the note.  ***   ASSESSMENT AND PLAN:   CAD - ***  he patient has no new sypmtoms.  No further cardiovascular testing is indicated.  We will continue with aggressive risk reduction and meds as listed.  CAROTID ARTERY STENOSIS, BILATERAL -  She had 40 - 59% bilateral stenosis in Sept 2020.  She is overdue for follow up.  ***  .  I will follow in one year.   HYPERLIPIDEMIA -  She is on the CLEAR study with bempedoic acid 180mg /day.  *** a non-statin alternative, versus placebo.  LDL was 120.     HTN - Her BP is *** controlled at home.he is very slightly elevated today but he has been unable to tolerate any medicines.   DM - She reports that the A1C was ***is 6.6 in July.  This is much improved.       Current medicines are reviewed at length with the patient today.  The patient {ACTIONS; HAS/DOES NOT HAVE:19233} concerns regarding medicines.  The following changes have been made:  {PLAN; NO CHANGE:13088:s}  Labs/ tests ordered today include: *** No orders of the defined types were placed in this encounter.    Disposition:   FU with ***    Signed, August, MD  12/29/2019 11:33 AM    [facility]

## 2019-12-31 ENCOUNTER — Ambulatory Visit: Payer: Medicare HMO | Admitting: Cardiology

## 2019-12-31 DIAGNOSIS — E119 Type 2 diabetes mellitus without complications: Secondary | ICD-10-CM

## 2019-12-31 DIAGNOSIS — I6523 Occlusion and stenosis of bilateral carotid arteries: Secondary | ICD-10-CM

## 2019-12-31 DIAGNOSIS — I2510 Atherosclerotic heart disease of native coronary artery without angina pectoris: Secondary | ICD-10-CM

## 2019-12-31 DIAGNOSIS — E785 Hyperlipidemia, unspecified: Secondary | ICD-10-CM

## 2019-12-31 DIAGNOSIS — I10 Essential (primary) hypertension: Secondary | ICD-10-CM

## 2020-01-23 ENCOUNTER — Other Ambulatory Visit: Payer: Self-pay

## 2020-01-23 ENCOUNTER — Emergency Department: Payer: Medicare HMO

## 2020-01-23 ENCOUNTER — Inpatient Hospital Stay
Admission: EM | Admit: 2020-01-23 | Discharge: 2020-01-29 | DRG: 177 | Disposition: A | Discharge status: Home Health | Payer: Medicare HMO | Attending: Internal Medicine | Admitting: Internal Medicine

## 2020-01-23 DIAGNOSIS — T380X5A Adverse effect of glucocorticoids and synthetic analogues, initial encounter: Secondary | ICD-10-CM | POA: Diagnosis not present

## 2020-01-23 DIAGNOSIS — Z888 Allergy status to other drugs, medicaments and biological substances status: Secondary | ICD-10-CM

## 2020-01-23 DIAGNOSIS — M19041 Primary osteoarthritis, right hand: Secondary | ICD-10-CM | POA: Diagnosis present

## 2020-01-23 DIAGNOSIS — Z9981 Dependence on supplemental oxygen: Secondary | ICD-10-CM

## 2020-01-23 DIAGNOSIS — Z86718 Personal history of other venous thrombosis and embolism: Secondary | ICD-10-CM

## 2020-01-23 DIAGNOSIS — Z823 Family history of stroke: Secondary | ICD-10-CM

## 2020-01-23 DIAGNOSIS — J9601 Acute respiratory failure with hypoxia: Secondary | ICD-10-CM | POA: Diagnosis present

## 2020-01-23 DIAGNOSIS — Z9114 Patient's other noncompliance with medication regimen: Secondary | ICD-10-CM

## 2020-01-23 DIAGNOSIS — E1159 Type 2 diabetes mellitus with other circulatory complications: Secondary | ICD-10-CM

## 2020-01-23 DIAGNOSIS — Z886 Allergy status to analgesic agent status: Secondary | ICD-10-CM

## 2020-01-23 DIAGNOSIS — Z955 Presence of coronary angioplasty implant and graft: Secondary | ICD-10-CM

## 2020-01-23 DIAGNOSIS — I2510 Atherosclerotic heart disease of native coronary artery without angina pectoris: Secondary | ICD-10-CM | POA: Diagnosis present

## 2020-01-23 DIAGNOSIS — A0839 Other viral enteritis: Secondary | ICD-10-CM | POA: Diagnosis present

## 2020-01-23 DIAGNOSIS — E039 Hypothyroidism, unspecified: Secondary | ICD-10-CM | POA: Diagnosis present

## 2020-01-23 DIAGNOSIS — M19042 Primary osteoarthritis, left hand: Secondary | ICD-10-CM | POA: Diagnosis present

## 2020-01-23 DIAGNOSIS — I10 Essential (primary) hypertension: Secondary | ICD-10-CM | POA: Diagnosis present

## 2020-01-23 DIAGNOSIS — I252 Old myocardial infarction: Secondary | ICD-10-CM

## 2020-01-23 DIAGNOSIS — E785 Hyperlipidemia, unspecified: Secondary | ICD-10-CM | POA: Diagnosis present

## 2020-01-23 DIAGNOSIS — Z79899 Other long term (current) drug therapy: Secondary | ICD-10-CM

## 2020-01-23 DIAGNOSIS — J9600 Acute respiratory failure, unspecified whether with hypoxia or hypercapnia: Secondary | ICD-10-CM | POA: Diagnosis not present

## 2020-01-23 DIAGNOSIS — Z91030 Bee allergy status: Secondary | ICD-10-CM

## 2020-01-23 DIAGNOSIS — Z951 Presence of aortocoronary bypass graft: Secondary | ICD-10-CM

## 2020-01-23 DIAGNOSIS — Z8249 Family history of ischemic heart disease and other diseases of the circulatory system: Secondary | ICD-10-CM

## 2020-01-23 DIAGNOSIS — Z66 Do not resuscitate: Secondary | ICD-10-CM | POA: Diagnosis present

## 2020-01-23 DIAGNOSIS — Z794 Long term (current) use of insulin: Secondary | ICD-10-CM

## 2020-01-23 DIAGNOSIS — E1151 Type 2 diabetes mellitus with diabetic peripheral angiopathy without gangrene: Secondary | ICD-10-CM | POA: Diagnosis present

## 2020-01-23 DIAGNOSIS — Z800 Family history of malignant neoplasm of digestive organs: Secondary | ICD-10-CM

## 2020-01-23 DIAGNOSIS — J1282 Pneumonia due to coronavirus disease 2019: Secondary | ICD-10-CM

## 2020-01-23 DIAGNOSIS — E1165 Type 2 diabetes mellitus with hyperglycemia: Secondary | ICD-10-CM | POA: Diagnosis not present

## 2020-01-23 DIAGNOSIS — E119 Type 2 diabetes mellitus without complications: Secondary | ICD-10-CM

## 2020-01-23 DIAGNOSIS — Z885 Allergy status to narcotic agent status: Secondary | ICD-10-CM

## 2020-01-23 DIAGNOSIS — U071 COVID-19: Principal | ICD-10-CM | POA: Diagnosis present

## 2020-01-23 DIAGNOSIS — I25119 Atherosclerotic heart disease of native coronary artery with unspecified angina pectoris: Secondary | ICD-10-CM | POA: Diagnosis not present

## 2020-01-23 DIAGNOSIS — I5032 Chronic diastolic (congestive) heart failure: Secondary | ICD-10-CM | POA: Diagnosis present

## 2020-01-23 DIAGNOSIS — M479 Spondylosis, unspecified: Secondary | ICD-10-CM | POA: Diagnosis present

## 2020-01-23 DIAGNOSIS — I110 Hypertensive heart disease with heart failure: Secondary | ICD-10-CM | POA: Diagnosis present

## 2020-01-23 LAB — COMPREHENSIVE METABOLIC PANEL
ALT: 22 U/L (ref 0–44)
AST: 44 U/L — ABNORMAL HIGH (ref 15–41)
Albumin: 3.1 g/dL — ABNORMAL LOW (ref 3.5–5.0)
Alkaline Phosphatase: 29 U/L — ABNORMAL LOW (ref 38–126)
Anion gap: 12 (ref 5–15)
BUN: 23 mg/dL (ref 8–23)
CO2: 24 mmol/L (ref 22–32)
Calcium: 8.4 mg/dL — ABNORMAL LOW (ref 8.9–10.3)
Chloride: 95 mmol/L — ABNORMAL LOW (ref 98–111)
Creatinine, Ser: 0.84 mg/dL (ref 0.44–1.00)
GFR, Estimated: >60 mL/min (ref >60)
Glucose, Bld: 114 mg/dL — ABNORMAL HIGH (ref 70–99)
Potassium: 3.8 mmol/L (ref 3.5–5.1)
Sodium: 131 mmol/L — ABNORMAL LOW (ref 135–145)
Total Bilirubin: 1.1 mg/dL (ref 0.3–1.2)
Total Protein: 6.9 g/dL (ref 6.5–8.1)

## 2020-01-23 LAB — CBC WITH DIFFERENTIAL/PLATELET
Abs Immature Granulocytes: 0.07 10*3/uL (ref 0.00–0.07)
Basophils Absolute: 0 10*3/uL (ref 0.0–0.1)
Basophils Relative: 0 %
Eosinophils Absolute: 0 10*3/uL (ref 0.0–0.5)
Eosinophils Relative: 0 %
HCT: 34.6 % — ABNORMAL LOW (ref 36.0–46.0)
Hemoglobin: 11.8 g/dL — ABNORMAL LOW (ref 12.0–15.0)
Immature Granulocytes: 1 %
Lymphocytes Relative: 13 %
Lymphs Abs: 0.7 10*3/uL (ref 0.7–4.0)
MCH: 28.4 pg (ref 26.0–34.0)
MCHC: 34.1 g/dL (ref 30.0–36.0)
MCV: 83.4 fL (ref 80.0–100.0)
Monocytes Absolute: 0.4 10*3/uL (ref 0.1–1.0)
Monocytes Relative: 8 %
Neutro Abs: 3.9 10*3/uL (ref 1.7–7.7)
Neutrophils Relative %: 78 %
Platelets: 167 10*3/uL (ref 150–400)
RBC: 4.15 MIL/uL (ref 3.87–5.11)
RDW: 12.8 % (ref 11.5–15.5)
Smear Review: NORMAL
WBC: 5.1 10*3/uL (ref 4.0–10.5)
nRBC: 0 % (ref 0.0–0.2)

## 2020-01-23 LAB — TROPONIN I (HIGH SENSITIVITY): Troponin I (High Sensitivity): 20 ng/L — ABNORMAL HIGH (ref <18)

## 2020-01-23 LAB — RESPIRATORY PANEL BY RT PCR (FLU A&B, COVID)
Influenza A by PCR: NEGATIVE
Influenza B by PCR: NEGATIVE
SARS Coronavirus 2 by RT PCR: POSITIVE — AB

## 2020-01-23 LAB — BRAIN NATRIURETIC PEPTIDE: B Natriuretic Peptide: 92.3 pg/mL (ref 0.0–100.0)

## 2020-01-23 LAB — MAGNESIUM: Magnesium: 2.2 mg/dL (ref 1.7–2.4)

## 2020-01-23 LAB — FIBRIN DERIVATIVES D-DIMER (ARMC ONLY): Fibrin derivatives D-dimer (ARMC): 2153.44 ng/mL (FEU) — ABNORMAL HIGH (ref 0.00–499.00)

## 2020-01-23 MED ORDER — IOHEXOL 350 MG/ML SOLN: 75.0000 mL | Freq: Once | INTRAVENOUS | Status: DC | PRN

## 2020-01-23 MED ORDER — SODIUM CHLORIDE 0.9 % IV SOLN
100.0000 mg | Freq: Every day | INTRAVENOUS | Status: DC
  Filled 2020-01-23: qty 20

## 2020-01-23 MED ORDER — ASCORBIC ACID 500 MG PO TABS
500.0000 mg | ORAL_TABLET | Freq: Every day | ORAL | Status: DC
  Administered 2020-01-24 – 2020-01-29 (×6): 500 mg via ORAL
  Filled 2020-01-23 (×6): qty 1

## 2020-01-23 MED ORDER — SODIUM CHLORIDE 0.9 % IV SOLN
200.0000 mg | Freq: Once | INTRAVENOUS | Status: DC
  Filled 2020-01-23: qty 40

## 2020-01-23 MED ORDER — LACTATED RINGERS IV BOLUS
500.0000 mL | Freq: Once | INTRAVENOUS | Status: AC
  Administered 2020-01-24: 500 mL via INTRAVENOUS

## 2020-01-23 MED ORDER — ZINC SULFATE 220 (50 ZN) MG PO CAPS
220.0000 mg | ORAL_CAPSULE | Freq: Every day | ORAL | Status: DC
  Administered 2020-01-24 – 2020-01-29 (×6): 220 mg via ORAL
  Filled 2020-01-23 (×6): qty 1

## 2020-01-23 MED ORDER — INSULIN ASPART 100 UNIT/ML ~~LOC~~ SOLN
0.0000 [IU] | SUBCUTANEOUS | Status: DC
  Administered 2020-01-24: 2 [IU] via SUBCUTANEOUS
  Administered 2020-01-25: 5 [IU] via SUBCUTANEOUS
  Administered 2020-01-25: 2 [IU] via SUBCUTANEOUS
  Administered 2020-01-25 (×4): 5 [IU] via SUBCUTANEOUS
  Administered 2020-01-26 (×2): 3 [IU] via SUBCUTANEOUS
  Filled 2020-01-23 (×9): qty 1

## 2020-01-23 NOTE — H&P (Signed)
[name]:[id number] DOB: 1945/03/18 DOA: 01/23/2020     PCP: [name], MD   Outpatient Specialists:   CARDS:    Lynden Oxford,  Patient arrived to ER on 01/23/20 at 2147 Referred by Attending Gilles Chiquito, MD   Patient coming from: home Lives alone,       Chief Complaint:   Chief Complaint  Patient presents with  . Shortness of Breath    HPI: [name] is a 74 y.o. female with medical history significant of  DM2,  CAD, DVT  HTN, PVD, HDL recent diagnosis of COVID-19 on 11/7,   Presented with shortness of breath she tested positive for Covid 7 days ago. Home O2 sats 80%. Patient reports worsening cough shortness of breath generalized weakness decreased p.o. intake and diarrhea She was started on Oxygen at home by her PCP She was scheduled to have MAB infusion done But read side effects and refused.  Daughter try to put on oxygen at home she was up to 8 L but despite that continued to be hypoxic EMS started on non-rebreather  Family members have been ill aswell  Patient states she has ben off all her medications because she haas read their side effects. She is no longer on Plavix  Patient states she has read and heard about remdesivir and knows all about it side effects and refuses to have any States she has multiple allergic reactions to steroids  Infectious risk factors:  Reports   fever, shortness of breath, dry cough Sore throat, URI symptoms,  Diarrhea   Body aches, severe fatigue    KNOWN COVID POSITIVE   Has  NOt been vaccinated against COVID    Initial COVID TEST   POSITIVE,     Lab Results  Component Value Date   SARSCOV2NAA POSITIVE (A) 01/23/2020     Regarding pertinent Chronic problems:    Hyperlipidemia -  Not on statins Lipid Panel     Component Value Date/Time   CHOL 289 (H) 11/20/2011 1159   TRIG 240 (H) 10/29/2014 1014   HDL 48 10/29/2014 1014   CHOLHDL 5.4 (H) 10/29/2014 1014   CHOLHDL 5.7  11/20/2011 1159   VLDL 40 11/20/2011 1159   LDLCALC 198 (H) 11/20/2011 1159   LDLDIRECT 155 (H) 10/29/2014 1014     HTN not on any meds   chronic CHF diastolic   - last echo 01/2014 Echo: EF 55%, Gr1 DD, triv MR. Stopped Lasix  CAD  -does not tolerate aspirin, on Plavix                - followed by cardiology                - last cardiac cath 2015  Remote DVT not on any medications  Peripheral vascular disease required PTA to left leg no longer on Plavix   DM 2 -  Lab Results  Component Value Date   HGBA1C 7.7 (H) 11/05/2015   on insulin,    Hypothyroidism:  Lab Results  Component Value Date   TSH 2.081 11/05/2015  No longer on synthroid       While in ER: Started on remdesivir Start on 6 L nasal cannula satting 99 Remdesivir was ordered but patient has refused CTA chest was ordered to evaluate for any evidence of PE given elevated D-dimer patient refused  She is agreeable to oxygen  Hospitalist was called for admission for acute respiratory failure secondary to Covid infection  The following Work up has been ordered so far:  Orders Placed This Encounter  Procedures  . Respiratory Panel by RT PCR (Flu A&B, Covid) - Nasopharyngeal Swab  . DG Chest 1 View  . CBC with Differential  . Comprehensive metabolic panel  . Brain natriuretic peptide  . Magnesium  . Fibrin derivatives D-Dimer ([facility] only)  . Procalcitonin - Baseline  . pharmacy consult  . Consult to hospitalist  ALL PATIENTS BEING ADMITTED/HAVING PROCEDURES NEED COVID-19 SCREENING  . EKG 12-Lead    Following Medications were ordered in ER: Medications  remdesivir 200 mg in sodium chloride 0.9% 250 mL IVPB (has no administration in time range)    Followed by  remdesivir 100 mg in sodium chloride 0.9 % 100 mL IVPB (has no administration in time range)  lactated ringers bolus 500 mL (has no administration in time range)        Consult Orders  (From admission, onward)         Start     Ordered    01/23/20 2254  Consult to hospitalist  ALL PATIENTS BEING ADMITTED/HAVING PROCEDURES NEED COVID-19 SCREENING  Once       Comments: ALL PATIENTS BEING ADMITTED/HAVING PROCEDURES NEED COVID-19 SCREENING  Provider:  (Not yet assigned)  Question Answer Comment  Place call to: [phone]   Reason for Consult Admit   Diagnosis/Clinical Info for Consult: COVID, on 6L      01/23/20 2255          Significant initial  Findings: Abnormal Labs Reviewed  CBC WITH DIFFERENTIAL/PLATELET - Abnormal; Notable for the following components:      Result Value   Hemoglobin 11.8 (*)    HCT 34.6 (*)    All other components within normal limits  COMPREHENSIVE METABOLIC PANEL - Abnormal; Notable for the following components:   Sodium 131 (*)    Chloride 95 (*)    Glucose, Bld 114 (*)    Calcium 8.4 (*)    Albumin 3.1 (*)    AST 44 (*)    Alkaline Phosphatase 29 (*)    All other components within normal limits  FIBRIN DERIVATIVES D-DIMER ([facility] ONLY) - Abnormal; Notable for the following components:   Fibrin derivatives D-dimer [facility]) 2,153.44 (*)    All other components within normal limits  TROPONIN I (HIGH SENSITIVITY) - Abnormal; Notable for the following components:   Troponin I (High Sensitivity) 20 (*)    All other components within normal limits    Otherwise labs showing:   Recent Labs  Lab 01/23/20 2157  NA 131*  K 3.8  CO2 24  GLUCOSE 114*  BUN 23  CREATININE 0.84  CALCIUM 8.4*  MG 2.2   Cr    stable,    Lab Results  Component Value Date   CREATININE 0.84 01/23/2020   CREATININE 0.74 03/16/2018   CREATININE 0.77 05/04/2017   Recent Labs  Lab 01/23/20 2157  AST 44*  ALT 22  ALKPHOS 29*  BILITOT 1.1  PROT 6.9  ALBUMIN 3.1*   Lab Results  Component Value Date   CALCIUM 8.4 (L) 01/23/2020   WBC      Component Value Date/Time   WBC 5.1 01/23/2020 2157   LYMPHSABS 0.7 01/23/2020 2157   MONOABS 0.4 01/23/2020 2157   EOSABS 0.0 01/23/2020 2157   BASOSABS 0.0 01/23/2020  2157   Plt: Lab Results  Component Value Date   PLT 167 01/23/2020      Procalcitonin  0.12 COVID-19 Labs  Recent Labs    01/23/20 2352  FERRITIN 929*  LDH 312*    Lab Results  Component Value Date   SARSCOV2NAA POSITIVE (A) 01/23/2020     HG/HCT   Stable,     Component Value Date/Time   HGB 11.8 (L) 01/23/2020 2157   HCT 34.6 (L) 01/23/2020 2157   MCV 83.4 01/23/2020 2157   Troponin 20 -17   ECG: Ordered Personally reviewed by me showing: HR : 90  Rhythm:  NSR,      no evidence of ischemic changes QTC 496    UA    ordered    CXR - COVID pneumonia    ED Triage Vitals  Enc Vitals Group     BP 01/23/20 2151 (!) 108/56     Pulse Rate 01/23/20 2151 87     Resp 01/23/20 2151 18     Temp 01/23/20 2151 98.8 F (37.1 C)     Temp Source 01/23/20 2151 Oral     SpO2 01/23/20 2150 98 %     Weight 01/23/20 2153 175 lb (79.4 kg)     Height 01/23/20 2153 5\' 5"  (1.651 m)     Head Circumference --      Peak Flow --      Pain Score 01/23/20 2153 0     Pain Loc --      Pain Edu? --      Excl. in GC? --   TMAX(24)@       Latest  Blood pressure (!) 108/56, pulse 80, temperature 98.8 F (37.1 C), temperature source Oral, resp. rate (!) 28, height 5\' 5"  (1.651 m), weight 79.4 kg, SpO2 96 %.   Review of Systems:    Pertinent positives include:  Fevers, chills, fatigue shortness of breath at rest.   dyspnea on exertion,  non-productive cough, Constitutional:  No weight loss, night sweats, , weight loss  HEENT:  No headaches, Difficulty swallowing,Tooth/dental problems,Sore throat,  No sneezing, itching, ear ache, nasal congestion, post nasal drip,  Cardio-vascular:  No chest pain, Orthopnea, PND, anasarca, dizziness, palpitations.no Bilateral lower extremity swelling  GI:  No heartburn, indigestion, abdominal pain, nausea, vomiting, diarrhea, change in bowel habits, loss of appetite, melena, blood in stool, hematemesis Resp:  no  No excess mucus, no productive  cough, No No coughing up of blood.No change in color of mucus.No wheezing. Skin:  no rash or lesions. No jaundice GU:  no dysuria, change in color of urine, no urgency or frequency. No straining to urinate.  No flank pain.  Musculoskeletal:  No joint pain or no joint swelling. No decreased range of motion. No back pain.  Psych:  No change in mood or affect. No depression or anxiety. No memory loss.  Neuro: no localizing neurological complaints, no tingling, no weakness, no double vision, no gait abnormality, no slurred speech, no confusion  All systems reviewed and apart from HOPI all are negative  Past Medical History:   Past Medical History:  Diagnosis Date  . Arthritis    "hands, back" (12/09/2013)  . Aspirin allergy   . CAD (coronary artery disease)    a. 03/2010 s/p CABG x 3 (LIMA->LAD, VG->OM, VG->Diag);  b. 9/15 PCI native LCX (2.75x16 & 2.75x8 Promus DES');  c. 02/2014 Cath: LM nl, LAD 80-90/100p, LCX patent stents, OM1 100, OM2 99ost, RCA 100p, VG->Diag 100, VG->OM nl, LIMA->LAD nl, EF 55-65%-->Med Rx; d. 10/2015 NSTEMI/Cath: stable anatomy, patent LCX stent, 2/3 patent grafts as prev noted, EF 45%-->Med Rx w/  nitrate/ranexa.  . Carotid arterial disease (HCC)    a. 08/2015 U/S: RICA 40-59%, LICA 1-39%-->f/u 1 yr.  . Chronic stable angina (HCC)   . Diastolic dysfunction    a. 01/2014 Echo: EF 55%, Gr1 DD, triv MR.  . DVT (deep venous thrombosis) (HCC) 1970's   LLE  . HLD (hyperlipidemia)    statin intolerant  . Hypertensive heart disease   . Myocardial infarction (HCC) 03/04/2016   STEMI  . Obesity   . Orthostatic hypotension    previous Midodrine therapy  . Pneumonia 2013?  Marland Kitchen PONV (postoperative nausea and vomiting)   . PVD (peripheral vascular disease) (HCC)    a. prior balloon PTA to left leg in [place].  . Type II diabetes mellitus (HCC)      Past Surgical History:  Procedure Laterality Date  . ANGIOPLASTY / STENTING FEMORAL Left 03/14/2011  . APPENDECTOMY     . CARDIAC CATHETERIZATION  03/2010; 2013  . CARDIAC CATHETERIZATION N/A 11/05/2015   Procedure: LEFT HEART CATH AND CORS/GRAFTS ANGIOGRAPHY;  Surgeon: Antonieta Iba, MD;  Location: [facility] INVASIVE CV LAB;  Service: Cardiovascular;  Laterality: N/A;  . Carotid dopplers  03/2010   40-59% bilateral ICA stenosis  . CATARACT EXTRACTION W/ INTRAOCULAR LENS  IMPLANT, BILATERAL Bilateral   . CHOLECYSTECTOMY  2012  . CORONARY ANGIOPLASTY WITH STENT PLACEMENT  12/09/2013   circumflex was stented 2.75 x 16 overlapping 2.75 x 8 promus drug-eluting stents  . CORONARY ARTERY BYPASS GRAFT  03/2010   CABG X3  . EYE SURGERY    . LEFT HEART CATHETERIZATION WITH CORONARY/GRAFT ANGIOGRAM N/A 11/21/2011   Procedure: LEFT HEART CATHETERIZATION WITH Isabel Caprice;  Surgeon: Kathleene Hazel, MD;  Location: [facility] CATH LAB;  Service: Cardiovascular;  Laterality: N/A;  . LEFT HEART CATHETERIZATION WITH CORONARY/GRAFT ANGIOGRAM N/A 12/09/2013   Procedure: LEFT HEART CATHETERIZATION WITH Isabel Caprice;  Surgeon: Corky Crafts, MD;  Location: [facility] CATH LAB;  Service: Cardiovascular;  Laterality: N/A;  . LEFT HEART CATHETERIZATION WITH CORONARY/GRAFT ANGIOGRAM N/A 03/13/2014   Procedure: LEFT HEART CATHETERIZATION WITH Isabel Caprice;  Surgeon: Peter M Swaziland, MD;  Location: [facility] CATH LAB;  Service: Cardiovascular;  Laterality: N/A;  . LOWER EXTREMITY ANGIOGRAPHY Left 05/04/2017   Procedure: LOWER EXTREMITY ANGIOGRAPHY;  Surgeon: Annice Needy, MD;  Location: [facility] INVASIVE CV LAB;  Service: Cardiovascular;  Laterality: Left;  . LOWER EXTREMITY ANGIOGRAPHY Right 03/19/2018   Procedure: LOWER EXTREMITY ANGIOGRAPHY;  Surgeon: Annice Needy, MD;  Location: [facility] INVASIVE CV LAB;  Service: Cardiovascular;  Laterality: Right;  . PERCUTANEOUS CORONARY STENT INTERVENTION (PCI-S)  12/09/2013   Procedure: PERCUTANEOUS CORONARY STENT INTERVENTION (PCI-S);  Surgeon: Corky Crafts, MD;  Location: [facility] CATH  LAB;  Service: Cardiovascular;;  . REFRACTIVE SURGERY     laser  . RIGHT HEART CATHETERIZATION  11/21/2011   Procedure: RIGHT HEART CATH;  Surgeon: Kathleene Hazel, MD;  Location: [facility] CATH LAB;  Service: Cardiovascular;;  . TOE SURGERY Bilateral    titanium joints in bilateral great toes  . TONSILLECTOMY    . TUBAL LIGATION Bilateral   . VITRECTOMY Right 2015  Social History: Ambulatory  Independently    reports that she has never smoked. She has never used smokeless tobacco. She reports that she does not drink alcohol and does not use drugs.   Family History:   Family History  Problem Relation Age of Onset  . Esophageal cancer Mother   . Stroke Mother   . Heart attack Father  MI  . Heart attack Brother        CABG x 4   . Heart disease Brother        valve replacement    Allergies: Allergies  Allergen Reactions  . Bee Venom Anaphylaxis  . Statins Other (See Comments)    Crippling  . Lisinopril Other (See Comments)    Fluctuations BP  . Tylenol [Acetaminophen] Diarrhea and Hypertension  . Aspirin Hives  . Atorvastatin Other (See Comments)    Arthralgias  . Codeine Hives and Nausea Only  . Cortisone Hives  . Demerol Hives  . Meperidine Hives and Nausea And Vomiting  . Metformin Other (See Comments)    Intolerance  . Prednisone Hives     Prior to Admission medications   Medication Sig Start Date End Date Taking? Authorizing Provider  clopidogrel (PLAVIX) 75 MG tablet Take 1 tablet (75 mg total) by mouth daily. 05/04/17   Annice Needy, MD  furosemide (LASIX) 20 MG tablet Take 1 tablet (20 mg total) by mouth daily as needed for fluid. 10/31/16   Rollene Rotunda, MD  insulin NPH-regular Human (NOVOLIN 70/30) (70-30) 100 UNIT/ML injection Inject into the skin. Inject 20 in the morning and 20 in the evening    [provider]  nitroGLYCERIN (NITROSTAT) 0.4 MG SL tablet PLACE 1 TABLET UNDER TONGUE EVERY 5 MIN AS NEEDED FOR CHEST PAIN IF NO RELIEF IN15 MIN  CALL 911 (MAX 3 TABS) 11/30/17   Rollene Rotunda, MD   Physical Exam: Vitals with BMI 01/23/2020 01/23/2020 01/23/2020  Height - - -  Weight - - -  BMI - - -  Systolic - - -  Diastolic - - -  Pulse 80 87 85    1. General:  in No  Acute distress   Chronically ill  -appearing 2. Psychological: Alert and  Oriented 3. Head/ENT:    Dry Mucous Membranes                          Head Non traumatic, neck supple                          Poor Dentition 4. SKIN:  decreased Skin turgor,  Skin clean Dry and intact no rash 5. Heart: Regular rate and rhythm no  Murmur, no Rub or gallop 6. Lungs:  no wheezes or crackles   7. Abdomen: Soft,  non-tender, Non distended  Obese bowel sounds present 8. Lower extremities: no clubbing, cyanosis, no  edema 9. Neurologically Grossly intact, moving all 4 extremities equally  10. MSK: Normal range of motion   All other LABS:     Recent Labs  Lab 01/23/20 2157  WBC 5.1  NEUTROABS PENDING  HGB 11.8*  HCT 34.6*  MCV 83.4  PLT 167     Recent Labs  Lab 01/23/20 2157  NA 131*  K 3.8  CL 95*  CO2 24  GLUCOSE 114*  BUN 23  CREATININE 0.84  CALCIUM 8.4*  MG 2.2     Recent Labs  Lab 01/23/20 2157  AST 44*  ALT 22  ALKPHOS 29*  BILITOT 1.1  PROT 6.9  ALBUMIN 3.1*     Cultures:    Component Value Date/Time   SDES STOOL 10/02/2011 1928   SDES STOOL 10/02/2011 1928   SPECREQUEST NONE 10/02/2011 1928   SPECREQUEST NONE 10/02/2011 1928   CULT  10/02/2011 1928    NO  SALMONELLA, SHIGELLA, CAMPYLOBACTER, YERSINIA, OR E.COLI 0157:H7 ISOLATED   REPTSTATUS 10/06/2011 FINAL 10/02/2011 1928   REPTSTATUS 10/04/2011 FINAL 10/02/2011 1928     Radiological Exams on Admission: DG Chest 1 View  Result Date: 01/23/2020 CLINICAL DATA:  Difficulty breathing history of COVID EXAM: CHEST  1 VIEW COMPARISON:  11/05/2015 FINDINGS: Post sternotomy changes. Cardiomegaly. Streaky and patchy pulmonary consolidations at the lung bases and periphery  consistent with history of COVID positivity. No pneumothorax. Calcified breast implants, smaller on the left side. IMPRESSION: 1. Streaky and patchy pulmonary consolidations consistent with pneumonia. 2. Cardiomegaly. Electronically Signed   By: Jasmine Pang M.D.   On: 01/23/2020 22:24    Chart has been reviewed   Assessment/Plan  74 y.o. female with medical history significant of  DM2,  CAD, DVT  HTN, PVD, HDL recent diagnosis of COVID-19 on 11/7  Admitted for acute respiratory failure secondary to Covid infection  Present on Admission: . Pneumonia due to COVID-19 virus -    FROM HOME   WITH KNOWN HX OF COVID19 ER Novel Corona Virus testing:  Ordered 01/23/20 and is positive  Immunization status: Refused vaccination    Following concerning LAB/ imaging findings:     ANC/ALC ratio>3.5   LFTs: increased AST/ALT/Tbili   CRP, LDH: increased   IL-6 and Ferritin increased   Procalcitonin: low    CXR: hazy bilateral peripheral opacities      -Following work-up initiated:      sputum cultures  Ordered 01/23/20, Blood cultures   Ordered 01/23/20,   CTA patient has refused   Following complications noted:  elevated troponin noted likely demand ischemia will continue to follow  elevated LFT's likely in the setting of COVID continue to follow    Diarrhea , decreased PO intake resulting in dehydration - will rehydrate   acute respiratory failure with hypoxia - continue oxygen treatment  Plan of treatment: Admit on Airborn Precautions  Patient states she is severely allergic to steroids Refuses remdesivir stating it causes side effects   Hypoxia appears to be progressing  rapidly patient states she needs time to do research on baricitinib or Actemra before she can agree to initiation of this   - Will follow daily d.dimer - Assess for ability to prone  - Supportive management -Fluid sparing resuscitation  -Provide oxygen as needed currently on   SpO2: 94 % O2 Flow Rate (L/min):  6 L/min FiO2 (%): 98 %    Poor Prognostic factors  74 y.o.  Personal hx of  DM2, CAD,  , HTN,   , NON-Vaccinated status Evidence of  organ damage  Present  elevated trop,   respiratory failure requiring >4L [place]  Tachypnea, present on admission  ABS neutrophil to lymphocyte ratio >3.5 Risk factors for Cytokine storm CXR GGO Ferritin >250   and Lymphopenia  or elevated LFT,    elevated trop         Will order Airborne and Contact precautions  Family/ patient prognosis discussion: I have discussed case with the family/ patient  who are aware of their prognosis At this point she would like  to be  DNR/DNI      The treatment plan and use of medications and known side effects were discussed with patient/ family. It was clearly explained that there is no proven definitive treatment for COVID-19 infection yet. Any medications used here are based on case reports/anecdotal data which are not peer-reviewed and has not been studied using randomized control trials.  Complete risks and  long-term side effects are unknown, however in the best clinical judgment they seem to be of some clinical benefit rather than medical risks.  Patient/ family agree with the treatment plan and want to receive these treatments as indicated.     . Hypothyroidism - - Check TSH   . HLD (hyperlipidemia) - not on statins does not wish to take  . Essential hypertension - not on BP meds  . CAD (coronary artery disease) - does not tolerate aspirin not on statins stopped her Plavix  . Acute respiratory failure due to COVID-19 [facility] -continue to titrate oxygen as needed Assess for pronation patient states she would be willing to do them Patient continuous pulse Ox   . Chronic diastolic CHF (congestive heart failure) (HCC) currently appears to be slightly on the dry side we will monitor  DM 2-  - Order Sensitive  SSI    -  check TSH and HgA1C  may need long lasting insulin if evidence of hyperglycemia   Other plan as per  orders.  DVT prophylaxis:   Lovenox       Code Status:    Code Status: Prior   DNR/DNI   as per patient   I had personally discussed CODE STATUS with patient and family   Family Communication:   Family not at  Bedside  plan of care was discussed on the phone with grand- Daughter, Wife, Husband, Sister, Brother , father, mother  Disposition Plan:     To home once workup is complete and patient is stable   Following barriers for discharge:                             Oxygenation remains stable                                                         Will likely need home health, home O2, set up Consults called: none  Admission status:  ED Disposition    ED Disposition Condition Comment   Admit  Hospital Area: [facility] [[id number]]  Level of Care: Med-Surg [[id number]]  Covid Evaluation: Confirmed COVID Positive  Diagnosis: Pneumonia due to COVID-19 virus [[id number]]  Admitting Physician: Therisa Doyne [[id number]]  Attending Physician: Therisa Doyne [[id number]]  Estimated length of stay: 3 - 4 days  Certification:: I certify this patient will need inpatient services for at least 2 midnights          inpatient     I Expect 2 midnight stay secondary to severity of patient's current illness need for inpatient interventions justified by the following:  hemodynamic instability despite optimal treatment (tachycardia   tachypnea  Hypoxia, )   Severe lab/radiological/exam abnormalities including:   Pneumonia secondary to Covid and extensive comorbidities including:  DM2    CHF   CAD    That are currently affecting medical management.   I expect  patient to be hospitalized for 2 midnights requiring inpatient medical care.  Patient is at high risk for adverse outcome (such as loss of life or disability) if not treated.  Indication for inpatient stay as follows:    Hemodynamic instability despite maximal medical therapy,    New or worsening hypoxia  Need for  IV antivirals  Level of care   tele  For indefinitely please discontinue once patient no longer qualifies COVID-19 Labs   No results found for: SARSCOV2NAA   Precautions: admitted as  covid positive Airborne and Contact precautions    PPE: Used by the provider:   P100  eye Goggles,  Gloves  gown    Yasseen Salls 01/24/2020, 1:05 AM    [facility]     after 2 AM please page floor coverage PA If 7AM-7PM, please contact the day team taking care of the patient using [email or web address]   Patient was evaluated in the context of the global COVID-19 pandemic, which necessitated consideration that the patient might be at risk for infection with the SARS-CoV-2 virus that causes COVID-19. Institutional protocols and algorithms that pertain to the evaluation of patients at risk for COVID-19 are in a state of rapid change based on information released by regulatory bodies including the CDC and federal and state organizations. These policies and algorithms were followed during the patient's care.

## 2020-01-23 NOTE — ED Provider Notes (Signed)
[facility] Emergency Department Provider Note  ____________________________________________   First MD Initiated Contact with Patient 01/23/20 2150     (approximate)  I have reviewed the triage vital signs and the nursing notes.   HISTORY  Chief Complaint Shortness of Breath   HPI [name] is a 74 y.o. female with a past medical history of CAD, DVT, DM, HTN, PVD, HDL, and recent diagnosis of COVID-19 on 11/7 who presents for assessment of approximately 5 days of worsening cough and shortness of breath associated with generalized weakness poor p.o. intake and nonbloody diarrhea.  Per patient she had been placed on 2 L nasal cannula by her physician but increase this to 8 L as her SPO2 saturation was in the 80s. On EMS arrival patient's SPO2 saturation was in the 80s but improved to the 90s on nonrebreather.  Patient endorses general myalgias but denies any focal chest pain, back pain, abdominal pain, vomiting, rash, or falls or injuries.  She denies EtOH use or illicit drug use.  No clear alleviating aggravating factors.  No prior similar episodes.         Past Medical History:  Diagnosis Date  . Arthritis    "hands, back" (12/09/2013)  . Aspirin allergy   . CAD (coronary artery disease)    a. 03/2010 s/p CABG x 3 (LIMA->LAD, VG->OM, VG->Diag);  b. 9/15 PCI native LCX (2.75x16 & 2.75x8 Promus DES');  c. 02/2014 Cath: LM nl, LAD 80-90/100p, LCX patent stents, OM1 100, OM2 99ost, RCA 100p, VG->Diag 100, VG->OM nl, LIMA->LAD nl, EF 55-65%-->Med Rx; d. 10/2015 NSTEMI/Cath: stable anatomy, patent LCX stent, 2/3 patent grafts as prev noted, EF 45%-->Med Rx w/ nitrate/ranexa.  . Carotid arterial disease (HCC)    a. 08/2015 U/S: RICA 40-59%, LICA 1-39%-->f/u 1 yr.  . Chronic stable angina (HCC)   . Diastolic dysfunction    a. 01/2014 Echo: EF 55%, Gr1 DD, triv MR.  . DVT (deep venous thrombosis) (HCC) 1970's   LLE  . HLD (hyperlipidemia)    statin intolerant   . Hypertensive heart disease   . Myocardial infarction (HCC) 03/04/2016   STEMI  . Obesity   . Orthostatic hypotension    previous Midodrine therapy  . Pneumonia 2013?  Marland Kitchen PONV (postoperative nausea and vomiting)   . PVD (peripheral vascular disease) (HCC)    a. prior balloon PTA to left leg in [place].  . Type II diabetes mellitus [facility])     Patient Active Problem List   Diagnosis Date Noted  . Pneumonia due to COVID-19 virus 01/23/2020  . Acute respiratory failure due to COVID-19 (HCC) 01/23/2020  . Chronic diastolic CHF (congestive heart failure) (HCC) 01/23/2020  . Bilateral carotid artery stenosis 12/29/2019  . Essential hypertension 05/02/2017  . Atherosclerosis of native arteries of extremity with rest pain (HCC) 05/02/2017  . NSTEMI (non-ST elevated myocardial infarction) (HCC) 11/05/2015  . HLD (hyperlipidemia)   . CAD (coronary artery disease)   . Chronic stable angina (HCC)   . Hypertensive heart disease   . Type II diabetes mellitus (HCC)   . SOB (shortness of breath)   . Unstable angina (HCC) 03/13/2014  . Other and unspecified angina pectoris 12/09/2013  . Equivalent angina (HCC) 11/19/2011  . GOUT, UNSPECIFIED 05/18/2010  . HYPERLIPIDEMIA 04/19/2010  . Coronary atherosclerosis 04/19/2010  . CAROTID ARTERY STENOSIS, BILATERAL 04/19/2010  . ORTHOSTATIC HYPOTENSION 04/19/2010  . 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 Hypothyroidism 03/26/2010  . DM 03/26/2010    Past  Surgical History:  Procedure Laterality Date  . ANGIOPLASTY / STENTING FEMORAL Left 03/14/2011  . APPENDECTOMY    . CARDIAC CATHETERIZATION  03/2010; 2013  . CARDIAC CATHETERIZATION N/A 11/05/2015   Procedure: LEFT HEART CATH AND CORS/GRAFTS ANGIOGRAPHY;  Surgeon: Antonieta Iba, MD;  Location: [facility] INVASIVE CV LAB;  Service: Cardiovascular;  Laterality: N/A;  . Carotid dopplers  03/2010   40-59% bilateral ICA stenosis  . CATARACT EXTRACTION W/ INTRAOCULAR LENS  IMPLANT,  BILATERAL Bilateral   . CHOLECYSTECTOMY  2012  . CORONARY ANGIOPLASTY WITH STENT PLACEMENT  12/09/2013   circumflex was stented 2.75 x 16 overlapping 2.75 x 8 promus drug-eluting stents  . CORONARY ARTERY BYPASS GRAFT  03/2010   CABG X3  . EYE SURGERY    . LEFT HEART CATHETERIZATION WITH CORONARY/GRAFT ANGIOGRAM N/A 11/21/2011   Procedure: LEFT HEART CATHETERIZATION WITH Isabel Caprice;  Surgeon: Kathleene Hazel, MD;  Location: [facility] CATH LAB;  Service: Cardiovascular;  Laterality: N/A;  . LEFT HEART CATHETERIZATION WITH CORONARY/GRAFT ANGIOGRAM N/A 12/09/2013   Procedure: LEFT HEART CATHETERIZATION WITH Isabel Caprice;  Surgeon: Corky Crafts, MD;  Location: [facility] CATH LAB;  Service: Cardiovascular;  Laterality: N/A;  . LEFT HEART CATHETERIZATION WITH CORONARY/GRAFT ANGIOGRAM N/A 03/13/2014   Procedure: LEFT HEART CATHETERIZATION WITH Isabel Caprice;  Surgeon: Peter M Swaziland, MD;  Location: [facility] CATH LAB;  Service: Cardiovascular;  Laterality: N/A;  . LOWER EXTREMITY ANGIOGRAPHY Left 05/04/2017   Procedure: LOWER EXTREMITY ANGIOGRAPHY;  Surgeon: Annice Needy, MD;  Location: [facility] INVASIVE CV LAB;  Service: Cardiovascular;  Laterality: Left;  . LOWER EXTREMITY ANGIOGRAPHY Right 03/19/2018   Procedure: LOWER EXTREMITY ANGIOGRAPHY;  Surgeon: Annice Needy, MD;  Location: [facility] INVASIVE CV LAB;  Service: Cardiovascular;  Laterality: Right;  . PERCUTANEOUS CORONARY STENT INTERVENTION (PCI-S)  12/09/2013   Procedure: PERCUTANEOUS CORONARY STENT INTERVENTION (PCI-S);  Surgeon: Corky Crafts, MD;  Location: [facility] CATH LAB;  Service: Cardiovascular;;  . REFRACTIVE SURGERY     laser  . RIGHT HEART CATHETERIZATION  11/21/2011   Procedure: RIGHT HEART CATH;  Surgeon: Kathleene Hazel, MD;  Location: [facility] CATH LAB;  Service: Cardiovascular;;  . TOE SURGERY Bilateral    titanium joints in bilateral great toes  . TONSILLECTOMY    . TUBAL LIGATION Bilateral   . VITRECTOMY  Right 2015    Prior to Admission medications   Medication Sig Start Date End Date Taking? Authorizing Provider  clopidogrel (PLAVIX) 75 MG tablet Take 1 tablet (75 mg total) by mouth daily. 05/04/17   Annice Needy, MD  furosemide (LASIX) 20 MG tablet Take 1 tablet (20 mg total) by mouth daily as needed for fluid. 10/31/16   Rollene Rotunda, MD  insulin NPH-regular Human (NOVOLIN 70/30) (70-30) 100 UNIT/ML injection Inject 20-25 Units into the skin 2 (two) times daily with a meal. Inject 20 units with morning meal and 20-25 units with evening meal    [provider]  nitroGLYCERIN (NITROSTAT) 0.4 MG SL tablet PLACE 1 TABLET UNDER TONGUE EVERY 5 MIN AS NEEDED FOR CHEST PAIN IF NO RELIEF IN15 MIN CALL 911 (MAX 3 TABS) 11/30/17   Rollene Rotunda, MD    Allergies Bee venom, Statins, Lisinopril, Tylenol [acetaminophen], Aspirin, Atorvastatin, Codeine, Cortisone, Demerol, Meperidine, Metformin, and Prednisone  Family History  Problem Relation Age of Onset  . Esophageal cancer Mother   . Stroke Mother   . Heart attack Father        MI  . Heart attack Brother  CABG x 4   . Heart disease Brother        valve replacement    Social History Social History   Tobacco Use  . Smoking status: Never Smoker  . Smokeless tobacco: Never Used  Vaping Use  . Vaping Use: Never used  Substance Use Topics  . Alcohol use: No  . Drug use: No    Review of Systems  Review of Systems  Constitutional: Positive for chills, fever and malaise/fatigue.  HENT: Negative for sore throat.   Eyes: Negative for pain.  Respiratory: Positive for cough, sputum production and shortness of breath. Negative for stridor.   Cardiovascular: Negative for chest pain.  Gastrointestinal: Positive for diarrhea and nausea. Negative for vomiting.  Genitourinary: Negative for dysuria.  Musculoskeletal: Positive for myalgias.  Skin: Negative for rash.  Neurological: Negative for seizures, loss of consciousness and  headaches.  Psychiatric/Behavioral: Negative for suicidal ideas.  All other systems reviewed and are negative.     ____________________________________________   PHYSICAL EXAM:  VITAL SIGNS: ED Triage Vitals [01/23/20 2150]  Enc Vitals Group     BP      Pulse      Resp      Temp      Temp src      SpO2 98 %     Weight      Height      Head Circumference      Peak Flow      Pain Score      Pain Loc      Pain Edu?      Excl. in GC?    Vitals:   01/23/20 2215 01/23/20 2230  BP:    Pulse: 87 80  Resp: (!) 29 (!) 28  Temp:    SpO2: 95% 96%   Physical Exam Vitals and nursing note reviewed.  Constitutional:      General: She is not in acute distress.    Appearance: She is well-developed.  HENT:     Head: Normocephalic and atraumatic.     Right Ear: External ear normal.     Left Ear: External ear normal.     Nose: Nose normal.  Eyes:     Conjunctiva/sclera: Conjunctivae normal.  Cardiovascular:     Rate and Rhythm: Normal rate and regular rhythm.     Heart sounds: No murmur heard.   Pulmonary:     Effort: Pulmonary effort is normal. Tachypnea present. No respiratory distress.     Breath sounds: Examination of the right-middle field reveals rhonchi. Examination of the left-middle field reveals rhonchi. Examination of the right-lower field reveals rhonchi. Examination of the left-lower field reveals rhonchi. Rhonchi present.  Abdominal:     Palpations: Abdomen is soft.     Tenderness: There is no abdominal tenderness.  Musculoskeletal:     Cervical back: Neck supple.  Skin:    General: Skin is warm and dry.     Capillary Refill: Capillary refill takes 2 to 3 seconds.  Neurological:     General: No focal deficit present.     Mental Status: She is alert.     ____________________________________________   LABS (all labs ordered are listed, but only abnormal results are displayed)  Labs Reviewed  CBC WITH DIFFERENTIAL/PLATELET - Abnormal; Notable for the  following components:      Result Value   Hemoglobin 11.8 (*)    HCT 34.6 (*)    All other components within normal limits  COMPREHENSIVE METABOLIC PANEL -  Abnormal; Notable for the following components:   Sodium 131 (*)    Chloride 95 (*)    Glucose, Bld 114 (*)    Calcium 8.4 (*)    Albumin 3.1 (*)    AST 44 (*)    Alkaline Phosphatase 29 (*)    All other components within normal limits  FIBRIN DERIVATIVES D-DIMER ([facility] ONLY) - Abnormal; Notable for the following components:   Fibrin derivatives D-dimer [facility]) 1,696.78 (*)    All other components within normal limits  TROPONIN I (HIGH SENSITIVITY) - Abnormal; Notable for the following components:   Troponin I (High Sensitivity) 20 (*)    All other components within normal limits  RESPIRATORY PANEL BY RT PCR (FLU A&B, COVID)  BRAIN NATRIURETIC PEPTIDE  MAGNESIUM  PROCALCITONIN  TROPONIN I (HIGH SENSITIVITY)   ____________________________________________  EKG  Sinus rhythm with a ventricular rate of 90, slightly normal QRS at 121 with very poor tracing in lead I, lead II, and lead III without clear evidence of acute ischemia. ____________________________________________  RADIOLOGY  ED MD interpretation: Bilateral opacifications consistent with pneumonia.  Cardiomegaly.  No significant effusion or overt edema.  No pneumothorax.  Official radiology report(s): DG Chest 1 View  Result Date: 01/23/2020 CLINICAL DATA:  Difficulty breathing history of COVID EXAM: CHEST  1 VIEW COMPARISON:  11/05/2015 FINDINGS: Post sternotomy changes. Cardiomegaly. Streaky and patchy pulmonary consolidations at the lung bases and periphery consistent with history of COVID positivity. No pneumothorax. Calcified breast implants, smaller on the left side. IMPRESSION: 1. Streaky and patchy pulmonary consolidations consistent with pneumonia. 2. Cardiomegaly. Electronically Signed   By: Jasmine Pang M.D.   On: 01/23/2020 22:24     ____________________________________________   PROCEDURES  Procedure(s) performed (including Critical Care):  .Critical Care Performed by: Gilles Chiquito, MD Authorized by: Gilles Chiquito, MD   Critical care provider statement:    Critical care time (minutes):  45   Critical care was necessary to treat or prevent imminent or life-threatening deterioration of the following conditions:  Respiratory failure   Critical care was time spent personally by me on the following activities:  Discussions with consultants, evaluation of patient's response to treatment, examination of patient, ordering and performing treatments and interventions, ordering and review of laboratory studies, ordering and review of radiographic studies, pulse oximetry, re-evaluation of patient's condition, obtaining history from patient or surrogate and review of old charts     ____________________________________________   INITIAL IMPRESSION / ASSESSMENT AND PLAN / ED COURSE        Patient presents with worsening hypoxic respiratory failure as described above in the setting of recent COVID-19 diagnosis.  On arrival patient is placed on 6 L and has an SPO2 saturation of 96%.  However she is tachypneic and has otherwise stable vital signs on room air.  I suspect this is likely secondary to worsening of known COVID-19 infection.  However also plan to send a D-dimer to assess for patient's risk for PE at this time.  If this is elevated patient will likely require CTA imaging.  There is no significant arrhythmia on ECG however we will plan to send troponin to assess for evidence of myocarditis or ACS.  Patient is afebrile and has no elevation of white blood cell counts have a low suspicion for bacterial superinfection.  CMP has some mild hyponatremia with NA 131 and mild transaminitis with AST of 44 but no other significant ultralight or metabolic derangements.  CBC unremarkable with exception of hemoglobin of  11.8  compared to 12.24 years ago.  Low suspicion for acute symptomatic anemia.  BNP is 92 and overall patient does not appear volume overloaded and I will suspicion she is in heart failure at this time.  Troponin returned slightly elevated at 20.  Patient given ASA for some likely mild demand ischemia.  Given hypoxic restaurant failure I will plan to admit to medicine service for further evaluation and management.      ____________________________________________   FINAL CLINICAL IMPRESSION(S) / ED DIAGNOSES  Final diagnoses:  Acute respiratory failure with hypoxia (HCC)  COVID-19    Medications  remdesivir 200 mg in sodium chloride 0.9% 250 mL IVPB (has no administration in time range)    Followed by  remdesivir 100 mg in sodium chloride 0.9 % 100 mL IVPB (has no administration in time range)  lactated ringers bolus 500 mL (has no administration in time range)     ED Discharge Orders    None       Note:  This document was prepared using Dragon voice recognition software and may include unintentional dictation errors.   Gilles Chiquito, MD 01/23/20 2325

## 2020-01-23 NOTE — ED Notes (Signed)
Patient placed on 6L [place] patients vital signs remain stable. o2 remaining 95-99% on 6L

## 2020-01-23 NOTE — ED Triage Notes (Signed)
Patient reported via EMS for difficulty breathing. Per patient she was was tested positive for covid last Friday, on home 02, sats dropped to 80's.

## 2020-01-23 NOTE — Progress Notes (Signed)
Remdesivir - Pharmacy Brief Note    A/P:  Remdesivir 200 mg IVPB once followed by 100 mg IVPB daily x 4 days.   Valrie Hart, PharmD Clinical Pharmacist   01/23/2020 10:08 PM

## 2020-01-24 DIAGNOSIS — J9600 Acute respiratory failure, unspecified whether with hypoxia or hypercapnia: Secondary | ICD-10-CM

## 2020-01-24 LAB — GLUCOSE, CAPILLARY
Glucose-Capillary: 116 mg/dL — ABNORMAL HIGH (ref 70–99)
Glucose-Capillary: 102 mg/dL — ABNORMAL HIGH (ref 70–99)
Glucose-Capillary: 188 mg/dL — ABNORMAL HIGH (ref 70–99)

## 2020-01-24 LAB — FERRITIN: Ferritin: 929 ng/mL — ABNORMAL HIGH (ref 11–307)

## 2020-01-24 LAB — CBG MONITORING, ED
Glucose-Capillary: 110 mg/dL — ABNORMAL HIGH (ref 70–99)
Glucose-Capillary: 113 mg/dL — ABNORMAL HIGH (ref 70–99)

## 2020-01-24 LAB — TROPONIN I (HIGH SENSITIVITY): Troponin I (High Sensitivity): 17 ng/L (ref <18)

## 2020-01-24 LAB — LACTATE DEHYDROGENASE: LDH: 312 U/L — ABNORMAL HIGH (ref 98–192)

## 2020-01-24 LAB — C-REACTIVE PROTEIN: CRP: 19.4 mg/dL — ABNORMAL HIGH (ref <1.0)

## 2020-01-24 LAB — FIBRINOGEN: Fibrinogen: 558 mg/dL — ABNORMAL HIGH (ref 210–475)

## 2020-01-24 LAB — HEMOGLOBIN A1C
Hgb A1c MFr Bld: 6.2 % — ABNORMAL HIGH (ref 4.8–5.6)
Mean Plasma Glucose: 131.24 mg/dL

## 2020-01-24 LAB — PROCALCITONIN: Procalcitonin: 0.12 ng/mL

## 2020-01-24 MED ORDER — SODIUM CHLORIDE 0.9% FLUSH
3.0000 mL | Freq: Two times a day (BID) | INTRAVENOUS | Status: DC
  Administered 2020-01-24 – 2020-01-29 (×12): 3 mL via INTRAVENOUS

## 2020-01-24 MED ORDER — CHOLECALCIFEROL 10 MCG/ML (400 UNIT/ML) PO LIQD
10000.0000 [IU] | Freq: Every day | ORAL | Status: DC
  Administered 2020-01-24: 10000 [IU] via ORAL
  Filled 2020-01-24 (×3): qty 25

## 2020-01-24 MED ORDER — ONDANSETRON HCL 4 MG PO TABS: 4.0000 mg | ORAL_TABLET | Freq: Four times a day (QID) | ORAL | Status: DC | PRN

## 2020-01-24 MED ORDER — SELENIUM 50 MCG PO TABS
100.0000 ug | ORAL_TABLET | Freq: Every day | ORAL | Status: DC
  Filled 2020-01-24: qty 2

## 2020-01-24 MED ORDER — CLOPIDOGREL BISULFATE 75 MG PO TABS
75.0000 mg | ORAL_TABLET | Freq: Every day | ORAL | Status: DC
  Administered 2020-01-24 – 2020-01-27 (×4): 75 mg via ORAL
  Filled 2020-01-24 (×5): qty 1

## 2020-01-24 MED ORDER — SODIUM CHLORIDE 0.9% FLUSH
3.0000 mL | Freq: Two times a day (BID) | INTRAVENOUS | Status: DC
  Administered 2020-01-24 (×2): 3 mL via INTRAVENOUS

## 2020-01-24 MED ORDER — METHYLPREDNISOLONE SODIUM SUCC 125 MG IJ SOLR
60.0000 mg | Freq: Two times a day (BID) | INTRAMUSCULAR | Status: DC
  Administered 2020-01-24 – 2020-01-28 (×9): 60 mg via INTRAVENOUS
  Filled 2020-01-24 (×9): qty 2

## 2020-01-24 MED ORDER — ENOXAPARIN SODIUM 40 MG/0.4ML ~~LOC~~ SOLN: 40.0000 mg | SUBCUTANEOUS | Status: DC

## 2020-01-24 MED ORDER — ONDANSETRON HCL 4 MG/2ML IJ SOLN: 4.0000 mg | Freq: Four times a day (QID) | INTRAMUSCULAR | Status: DC | PRN

## 2020-01-24 MED ORDER — SODIUM CHLORIDE 0.9 % IV SOLN: 250.0000 mL | INTRAVENOUS | Status: DC | PRN

## 2020-01-24 MED ORDER — SELENIUM 200 MCG PO TABS
100.0000 ug | ORAL_TABLET | Freq: Every day | ORAL | Status: DC
  Administered 2020-01-25 – 2020-01-29 (×5): 100 ug via ORAL
  Filled 2020-01-24 (×6): qty 1

## 2020-01-24 MED ORDER — ADULT MULTIVITAMIN W/MINERALS CH
1.0000 | ORAL_TABLET | Freq: Every day | ORAL | Status: DC
  Administered 2020-01-25 – 2020-01-29 (×5): 1 via ORAL
  Filled 2020-01-24 (×5): qty 1

## 2020-01-24 MED ORDER — SODIUM CHLORIDE 0.9% FLUSH: 3.0000 mL | INTRAVENOUS | Status: DC | PRN

## 2020-01-24 MED ORDER — ENSURE ENLIVE PO LIQD
237.0000 mL | Freq: Three times a day (TID) | ORAL | Status: DC
  Administered 2020-01-24 – 2020-01-29 (×9): 237 mL via ORAL

## 2020-01-24 MED ORDER — GUAIFENESIN-DM 100-10 MG/5ML PO SYRP
10.0000 mL | ORAL_SOLUTION | Freq: Four times a day (QID) | ORAL | Status: DC | PRN
  Administered 2020-01-24: 10 mL via ORAL
  Filled 2020-01-24: qty 10

## 2020-01-24 NOTE — ED Notes (Signed)
Full report to [place]

## 2020-01-24 NOTE — ED Notes (Signed)
Pt being transported to [facility] 112 with dietary tray with pt

## 2020-01-24 NOTE — ED Notes (Signed)
Daughter [name] updated on plan of care 

## 2020-01-24 NOTE — ED Notes (Signed)
pts daughter called and ed rn was unsure what the daughter wanted and daughter hung up on ed rn .

## 2020-01-24 NOTE — ED Notes (Signed)
Pt awake at this time. Pt requests water, this RN provided water, pt drank liquid with no difficulty. FSBS 113. Pt denies any other needs at this time. Call bell in reach.

## 2020-01-24 NOTE — Progress Notes (Signed)
PROGRESS NOTE    [name]  OFB:[id number] DOB: 15-Nov-1945 DOA: 01/23/2020 PCP: [name], MD    Assessment & Plan:   Active Problems:   Hypothyroidism   HLD (hyperlipidemia)   CAD (coronary artery disease)   Type II diabetes mellitus (HCC)   Essential hypertension   Pneumonia due to COVID-19 virus   Acute respiratory failure due to COVID-19 [facility])   Chronic diastolic CHF (congestive heart failure) (HCC)    [name] is a 74 y.o. female with medical history significant of  DM2, CAD, DVT  HTN, PVD, HDL recent diagnosis of COVID-19 on 11/7,   Presented with shortness of breath she tested positive for Covid 7 days ago. Home O2 sats 80%. Patient reports worsening cough shortness of breath generalized weakness decreased p.o. intake and diarrhea She was started on Oxygen at home by her PCP She was scheduled to have MAB infusion done But read side effects and refused.  Daughter try to put on oxygen at home she was up to 8 L but despite that continued to be hypoxic EMS started on non-rebreather.  # Acute hypoxic respiratory failure 2/2 COVID PNA --O2 requirement currently at 6L. --refused all treatment on admission. PLAN: --Continue supplemental O2 to keep sats >=92%, wean as tolerated --Start solumedrol as CRP was high on presentation --Vit C and Zinc   DVT prophylaxis: SCD/Compression stockings  Pt refused chemical ppx Code Status: DNR  Family Communication:  Status is: inpatient Dispo:   The patient is from: home Anticipated d/c is to: home Anticipated d/c date is: 2-3 days Patient currently is not medically stable to d/c due to: 6L O2   Subjective and Interval History:  No N/V/D currently.  No significant dyspnea.  Pt requested a whole bunch of vitamins she takes at home, and was agreeable to receiving steroid while in the hospital, after some convincing.     Objective: Vitals:   01/24/20 0930 01/24/20 1000 01/24/20 1040 01/24/20 1606    BP: 137/78 108/76 (!) 107/56 (!) 144/67  Pulse: 77 75 78 77  Resp: 11 (!) 26 18 20   Temp:   97.8 F (36.6 C) 98.3 F (36.8 C)  TempSrc:   Oral   SpO2: 94% 92% 94% 95%  Weight:      Height:        Intake/Output Summary (Last 24 hours) at 01/24/2020 1725 Last data filed at 01/24/2020 0100 Gross per 24 hour  Intake 500 ml  Output --  Net 500 ml   Filed Weights   01/23/20 2153  Weight: 79.4 kg    Examination:   Constitutional: NAD, AAOx3 HEENT: conjunctivae and lids normal, EOMI CV: RRR no M,R,G. Distal pulses +2.  No cyanosis.   RESP: CTA B/L over anterior, 6L GI: +BS, NTND Extremities: No effusions, edema, or tenderness in BLE SKIN: warm, dry and intact Neuro: II - XII grossly intact.  Sensation intact Psych: Normal mood and affect.     Data Reviewed: I have personally reviewed following labs and imaging studies  CBC: Recent Labs  Lab 01/23/20 2157  WBC 5.1  NEUTROABS 3.9  HGB 11.8*  HCT 34.6*  MCV 83.4  PLT 167   Basic Metabolic Panel: Recent Labs  Lab 01/23/20 2157  NA 131*  K 3.8  CL 95*  CO2 24  GLUCOSE 114*  BUN 23  CREATININE 0.84  CALCIUM 8.4*  MG 2.2   GFR: Estimated Creatinine Clearance: 59.4 mL/min (by C-G formula based on SCr of 0.84  mg/dL). Liver Function Tests: Recent Labs  Lab 01/23/20 2157  AST 44*  ALT 22  ALKPHOS 29*  BILITOT 1.1  PROT 6.9  ALBUMIN 3.1*   No results for input(s): LIPASE, AMYLASE in the last 168 hours. No results for input(s): AMMONIA in the last 168 hours. Coagulation Profile: No results for input(s): INR, PROTIME in the last 168 hours. Cardiac Enzymes: No results for input(s): CKTOTAL, CKMB, CKMBINDEX, TROPONINI in the last 168 hours. BNP (last 3 results) No results for input(s): PROBNP in the last 8760 hours. HbA1C: No results for input(s): HGBA1C in the last 72 hours. CBG: Recent Labs  Lab 01/24/20 0356 01/24/20 0903 01/24/20 1039 01/24/20 1616  GLUCAP 110* 113* 116* 102*   Lipid  Profile: No results for input(s): CHOL, HDL, LDLCALC, TRIG, CHOLHDL, LDLDIRECT in the last 72 hours. Thyroid Function Tests: No results for input(s): TSH, T4TOTAL, FREET4, T3FREE, THYROIDAB in the last 72 hours. Anemia Panel: Recent Labs    01/23/20 2352  FERRITIN 929*   Sepsis Labs: Recent Labs  Lab 01/23/20 2157  PROCALCITON 0.12    Recent Results (from the past 240 hour(s))  Respiratory Panel by RT PCR (Flu A&B, Covid) - Nasopharyngeal Swab     Status: Abnormal   Collection Time: 01/23/20  9:57 PM   Specimen: Nasopharyngeal Swab  Result Value Ref Range Status   SARS Coronavirus 2 by RT PCR POSITIVE (A) NEGATIVE Final    Comment: RESULT CALLED TO, READ BACK BY AND VERIFIED WITHJonny Ruiz [facility] RN [id number] 01/23/20 HNM (NOTE) SARS-CoV-2 target nucleic acids are DETECTED.  SARS-CoV-2 RNA is generally detectable in upper respiratory specimens  during the acute phase of infection. Positive results are indicative of the presence of the identified virus, but do not rule out bacterial infection or co-infection with other pathogens not detected by the test. Clinical correlation with patient history and other diagnostic information is necessary to determine patient infection status. The expected result is Negative.  Fact Sheet for Patients:  https://www.moore.com/  Fact Sheet for Healthcare Providers: https://www.young.biz/  This test is not yet approved or cleared by the [place] FDA and  has been authorized for detection and/or diagnosis of SARS-CoV-2 by FDA under an Emergency Use Authorization (EUA).  This EUA will remain in effect (meaning this test can be  used) for the duration of  the COVID-19 declaration under Section 564(b)(1) of the Act, 21 U.S.C. section 360bbb-3(b)(1), unless the authorization is terminated or revoked sooner.      Influenza A by PCR NEGATIVE NEGATIVE Final   Influenza B by PCR NEGATIVE NEGATIVE Final     Comment: (NOTE) The Xpert Xpress SARS-CoV-2/FLU/RSV assay is intended as an aid in  the diagnosis of influenza from Nasopharyngeal swab specimens and  should not be used as a sole basis for treatment. Nasal washings and  aspirates are unacceptable for Xpert Xpress SARS-CoV-2/FLU/RSV  testing.  Fact Sheet for Patients: https://www.moore.com/  Fact Sheet for Healthcare Providers: https://www.young.biz/  This test is not yet approved or cleared by the [place] FDA and  has been authorized for detection and/or diagnosis of SARS-CoV-2 by  FDA under an Emergency Use Authorization (EUA). This EUA will remain  in effect (meaning this test can be used) for the duration of the  Covid-19 declaration under Section 564(b)(1) of the Act, 21  U.S.C. section 360bbb-3(b)(1), unless the authorization is  terminated or revoked. Performed at [facility], [address], [place], [place] [address]   Culture, blood (Routine X  2) w Reflex to ID Panel     Status: None (Preliminary result)   Collection Time: 01/23/20 10:01 PM   Specimen: BLOOD  Result Value Ref Range Status   Specimen Description BLOOD RIGHT ANTECUBITAL  Final   Special Requests   Final    BOTTLES DRAWN AEROBIC AND ANAEROBIC Blood Culture results may not be optimal due to an excessive volume of blood received in culture bottles   Culture   Final    NO GROWTH < 12 HOURS Performed at [facility], [address], [place], [place] [address]    Report Status PENDING  Incomplete  Culture, blood (Routine X 2) w Reflex to ID Panel     Status: None (Preliminary result)   Collection Time: 01/23/20 10:01 PM   Specimen: BLOOD  Result Value Ref Range Status   Specimen Description BLOOD BLOOD LEFT FOREARM  Final   Special Requests   Final    BOTTLES DRAWN AEROBIC AND ANAEROBIC Blood Culture results may not be optimal due to an excessive volume of blood received in culture bottles    Culture   Final    NO GROWTH < 12 HOURS Performed at [facility], [address], [place], [place] [address]    Report Status PENDING  Incomplete      Radiology Studies: DG Chest 1 View  Result Date: 01/23/2020 CLINICAL DATA:  Difficulty breathing history of COVID EXAM: CHEST  1 VIEW COMPARISON:  11/05/2015 FINDINGS: Post sternotomy changes. Cardiomegaly. Streaky and patchy pulmonary consolidations at the lung bases and periphery consistent with history of COVID positivity. No pneumothorax. Calcified breast implants, smaller on the left side. IMPRESSION: 1. Streaky and patchy pulmonary consolidations consistent with pneumonia. 2. Cardiomegaly. Electronically Signed   By: Jasmine Pang M.D.   On: 01/23/2020 22:24     Scheduled Meds:  vitamin C  500 mg Oral Daily   cholecalciferol  10,000 Units Oral Daily   clopidogrel  75 mg Oral Daily   feeding supplement  237 mL Oral TID BM   insulin aspart  0-9 Units Subcutaneous Q4H   methylPREDNISolone (SOLU-MEDROL) injection  60 mg Intravenous BID   [START ON 01/25/2020] multivitamin with minerals  1 tablet Oral Daily   selenium  100 mcg Oral Daily   sodium chloride flush  3 mL Intravenous Q12H   sodium chloride flush  3 mL Intravenous Q12H   zinc sulfate  220 mg Oral Daily   Continuous Infusions:  sodium chloride       LOS: 1 day     Darlin Priestly, MD [facility] If 7PM-7AM, please contact night-coverage 01/24/2020, 5:25 PM

## 2020-01-24 NOTE — ED Notes (Signed)
PER HOSPITALIST PT A dnr/dni AND REFUSES ALL TREATMENT OTHER THAN OXYGEN

## 2020-01-24 NOTE — ED Notes (Signed)
Patient assisted to and from toilet. Placed back in bed with cardiac monitor in place. Bed low and locked with side rails raised x2 and call bell in reach.

## 2020-01-24 NOTE — Plan of Care (Signed)
  Problem: Education: Goal: Knowledge of General Education information will improve Description: Including pain rating scale, medication(s)/side effects and non-pharmacologic comfort measures Outcome: Progressing   Problem: Health Behavior/Discharge Planning: Goal: Ability to manage health-related needs will improve Outcome: Progressing   Problem: Clinical Measurements: Goal: Ability to maintain clinical measurements within normal limits will improve Outcome: Progressing Goal: Will remain free from infection Outcome: Progressing Goal: Diagnostic test results will improve Outcome: Progressing Goal: Respiratory complications will improve Outcome: Progressing Goal: Cardiovascular complication will be avoided Outcome: Progressing   Problem: Activity: Goal: Risk for activity intolerance will decrease Outcome: Progressing   Problem: Nutrition: Goal: Adequate nutrition will be maintained Outcome: Progressing   Problem: Coping: Goal: Level of anxiety will decrease Outcome: Progressing   Problem: Elimination: Goal: Will not experience complications related to bowel motility Outcome: Progressing Goal: Will not experience complications related to urinary retention Outcome: Progressing   Problem: Pain Managment: Goal: General experience of comfort will improve Outcome: Progressing   Problem: Safety: Goal: Ability to remain free from injury will improve Outcome: Progressing   Problem: Skin Integrity: Goal: Risk for impaired skin integrity will decrease Outcome: Progressing   Problem: Coping: Goal: Psychosocial and spiritual needs will be supported Outcome: Progressing   Problem: Respiratory: Goal: Will maintain a patent airway Outcome: Progressing Goal: Complications related to the disease process, condition or treatment will be avoided or minimized Outcome: Progressing   Problem: Education: Goal: Knowledge of risk factors and measures for prevention of condition will  improve Outcome: Progressing   Problem: Coping: Goal: Psychosocial and spiritual needs will be supported Outcome: Progressing   Problem: Respiratory: Goal: Will maintain a patent airway Outcome: Progressing Goal: Complications related to the disease process, condition or treatment will be avoided or minimized Outcome: Progressing

## 2020-01-24 NOTE — Progress Notes (Signed)
Initial Nutrition Assessment  DOCUMENTATION CODES:   Not applicable  INTERVENTION:   Ensure Enlive po TID, each supplement provides 350 kcal and 20 grams of protein  MVI daily   Gluten free diet   Pt at high refeed risk; recommend monitor potassium, magnesium and phosphorus labs daily until stable  NUTRITION DIAGNOSIS:   Increased nutrient needs related to catabolic illness (COVID 19) as evidenced by increased estimated needs.  GOAL:   Patient will meet greater than or equal to 90% of their needs  MONITOR:   PO intake, Supplement acceptance, Labs, Weight trends, Skin, I & O's  REASON FOR ASSESSMENT:   Malnutrition Screening Tool    ASSESSMENT:   74 y.o. female with a past medical history of CAD, CHF, DVT, DM, HTN, PVD and HLD who is admitted with COVID 31  Spoke with pt via phone. Pt reports poor appetite and oral intake for the past one and a half weeks. Pt reports that her appetite continues to be poor in hospital; pt reports eating some yogurt for lunch today. Pt reports that she follows a gluten free diet at home; pt reports that even small amounts of gluten will cause her to have chest pain. Pt reports that she has not been drinking any supplements at home. RD discussed with pt the importance of adequate nutrition needed to preserve lean muscle in the setting of COVID 19. Pt is willing to drink chocolate Ensure in hospital. RD will add supplements and MVI to help pt meet her estimated needs. Pt is likely at refeed risk. Pt reports her UBW is ~190lbs. Per chart, pt is down 15lbs(8%) from her UBW; RD unsure how recently weight loss occurred. Pt's last documented weight in chart was 205lbs in February.   Medications reviewed and include: vitamin C, cholecalciferol, insulin, solu-medrol, selenium, zinc  Labs reviewed: Na 131(L), Mg 2.2 wnl cbgs- 110, 113 x 24 hrs  NUTRITION - FOCUSED PHYSICAL EXAM: Unable to perform at this time   Diet Order:   Diet Order             Diet gluten free Room service appropriate? Yes; Fluid consistency: Thin  Diet effective now                EDUCATION NEEDS:   Education needs have been addressed  Skin:  Skin Assessment: Reviewed RN Assessment  Last BM:  11/10  Height:   Ht Readings from Last 1 Encounters:  01/23/20 5\' 5"  (1.651 m)    Weight:   Wt Readings from Last 1 Encounters:  01/23/20 79.4 kg    Ideal Body Weight:  56.8 kg  BMI:  Body mass index is 29.12 kg/m.  Estimated Nutritional Needs:   Kcal:  1700-2000kcal/day  Protein:  85-95g/day  Fluid:  1.4-1.7L/day  13/11/21 MS, RD, LDN Please refer to [facility] for RD and/or RD on-call/weekend/after hours pager

## 2020-01-24 NOTE — ED Notes (Signed)
Pt is currently resting at this time with no acute distress noted. Pt appears to be asleep at this time. O2 in place. Call bell in reach. Will continue to monitor.

## 2020-01-25 LAB — BASIC METABOLIC PANEL
Anion gap: 12 (ref 5–15)
BUN: 25 mg/dL — ABNORMAL HIGH (ref 8–23)
CO2: 27 mmol/L (ref 22–32)
Calcium: 8.8 mg/dL — ABNORMAL LOW (ref 8.9–10.3)
Chloride: 97 mmol/L — ABNORMAL LOW (ref 98–111)
Creatinine, Ser: 0.76 mg/dL (ref 0.44–1.00)
GFR, Estimated: >60 mL/min (ref >60)
Glucose, Bld: 282 mg/dL — ABNORMAL HIGH (ref 70–99)
Potassium: 3.9 mmol/L (ref 3.5–5.1)
Sodium: 136 mmol/L (ref 135–145)

## 2020-01-25 LAB — GLUCOSE, CAPILLARY
Glucose-Capillary: 223 mg/dL — ABNORMAL HIGH (ref 70–99)
Glucose-Capillary: 260 mg/dL — ABNORMAL HIGH (ref 70–99)
Glucose-Capillary: 262 mg/dL — ABNORMAL HIGH (ref 70–99)
Glucose-Capillary: 267 mg/dL — ABNORMAL HIGH (ref 70–99)
Glucose-Capillary: 270 mg/dL — ABNORMAL HIGH (ref 70–99)
Glucose-Capillary: 276 mg/dL — ABNORMAL HIGH (ref 70–99)

## 2020-01-25 LAB — CBC
HCT: 37.7 % (ref 36.0–46.0)
Hemoglobin: 12.7 g/dL (ref 12.0–15.0)
MCH: 28.1 pg (ref 26.0–34.0)
MCHC: 33.7 g/dL (ref 30.0–36.0)
MCV: 83.4 fL (ref 80.0–100.0)
Platelets: 199 10*3/uL (ref 150–400)
RBC: 4.52 MIL/uL (ref 3.87–5.11)
RDW: 12.7 % (ref 11.5–15.5)
WBC: 3.4 10*3/uL — ABNORMAL LOW (ref 4.0–10.5)
nRBC: 0 % (ref 0.0–0.2)

## 2020-01-25 LAB — FIBRIN DERIVATIVES D-DIMER (ARMC ONLY): Fibrin derivatives D-dimer (ARMC): 1776.87 ng/mL (FEU) — ABNORMAL HIGH (ref 0.00–499.00)

## 2020-01-25 LAB — MAGNESIUM: Magnesium: 2.5 mg/dL — ABNORMAL HIGH (ref 1.7–2.4)

## 2020-01-25 LAB — C-REACTIVE PROTEIN: CRP: 20 mg/dL — ABNORMAL HIGH (ref <1.0)

## 2020-01-25 MED ORDER — VITAMIN D 25 MCG (1000 UNIT) PO TABS
10000.0000 [IU] | ORAL_TABLET | Freq: Every day | ORAL | Status: DC
  Administered 2020-01-25 – 2020-01-29 (×5): 10000 [IU] via ORAL
  Filled 2020-01-25 (×5): qty 10

## 2020-01-25 MED ORDER — INSULIN ASPART PROT & ASPART (70-30 MIX) 100 UNIT/ML ~~LOC~~ SUSP
25.0000 [IU] | Freq: Two times a day (BID) | SUBCUTANEOUS | Status: DC
  Administered 2020-01-25 (×2): 25 [IU] via SUBCUTANEOUS
  Filled 2020-01-25 (×2): qty 10

## 2020-01-25 NOTE — Progress Notes (Signed)
PROGRESS NOTE    KALSEY LULL  GQQ:[id number] DOB: August 23, 1945 DOA: 01/23/2020 PCP: [name], MD    Assessment & Plan:   Active Problems:   Hypothyroidism   HLD (hyperlipidemia)   CAD (coronary artery disease)   Type II diabetes mellitus (HCC)   Essential hypertension   Pneumonia due to COVID-19 virus   Acute respiratory failure due to COVID-19 [facility])   Chronic diastolic CHF (congestive heart failure) (HCC)    MONETTE OMARA is a 74 y.o. female with medical history significant of  DM2, CAD, DVT  HTN, PVD, HDL recent diagnosis of COVID-19 on 11/7,   Presented with shortness of breath she tested positive for Covid 7 days ago. Home O2 sats 80%. Patient reports worsening cough shortness of breath generalized weakness decreased p.o. intake and diarrhea She was started on Oxygen at home by her PCP She was scheduled to have MAB infusion done But read side effects and refused.  Daughter try to put on oxygen at home she was up to 8 L but despite that continued to be hypoxic EMS started on non-rebreather.  # Acute hypoxic respiratory failure 2/2 COVID PNA --O2 requirement currently at 6L. --refused all treatment on admission. --CRP high ~19 on presentation.  Started on solumedrol. PLAN: --Continue supplemental O2 to keep sats >=92%, wean as tolerated --cont solumedrol 60 mg BID --trend CRP --Vit C and Zinc  # DM2 # Hyperglycemia exacerbated by steroid --A1c 6.2 --resume home 70/30 at 25u BID --SSI  # Hx of CAD and STEMI --cont home plavix  # PVD --cont home plavix   DVT prophylaxis: SCD/Compression stockings  Pt refused chemical ppx Code Status: DNR No intubation. Family Communication: daughter updated on the phone today Status is: inpatient Dispo:   The patient is from: home Anticipated d/c is to: home Anticipated d/c date is: >3 days Patient currently is not medically stable to d/c due to: 6L O2   Subjective and Interval History:  Pt  complained of numbness in 1-3 fingers of right.  Complained of weakness.  O2 requirement stable at 6L.   Objective: Vitals:   01/25/20 0019 01/25/20 0445 01/25/20 0809 01/25/20 1100  BP: (!) 142/75 (!) 162/79 (!) 166/76 132/61  Pulse: 67 72 70 75  Resp:  18 18 18   Temp:  97.7 F (36.5 C) 97.8 F (36.6 C) 97.7 F (36.5 C)  TempSrc:  Oral    SpO2: 93% 93% 94% 93%  Weight:      Height:        Intake/Output Summary (Last 24 hours) at 01/25/2020 1307 Last data filed at 01/25/2020 0809 Gross per 24 hour  Intake 240 ml  Output 650 ml  Net -410 ml   Filed Weights   01/23/20 2153  Weight: 79.4 kg    Examination:   Constitutional: NAD, AAOx3 HEENT: conjunctivae and lids normal, EOMI CV: No cyanosis.   RESP: normal respiratory effort, on 6L Extremities: No effusions, edema in BLE SKIN: warm, dry and intact Neuro: II - XII grossly intact.   Psych: depressed mood and affect.      Data Reviewed: I have personally reviewed following labs and imaging studies  CBC: Recent Labs  Lab 01/23/20 2157 01/25/20 0533  WBC 5.1 3.4*  NEUTROABS 3.9  --   HGB 11.8* 12.7  HCT 34.6* 37.7  MCV 83.4 83.4  PLT 167 199   Basic Metabolic Panel: Recent Labs  Lab 01/23/20 2157 01/25/20 0533  NA 131* 136  K 3.8 3.9  CL 95* 97*  CO2 24 27  GLUCOSE 114* 282*  BUN 23 25*  CREATININE 0.84 0.76  CALCIUM 8.4* 8.8*  MG 2.2 2.5*   GFR: Estimated Creatinine Clearance: 62.3 mL/min (by C-G formula based on SCr of 0.76 mg/dL). Liver Function Tests: Recent Labs  Lab 01/23/20 2157  AST 44*  ALT 22  ALKPHOS 29*  BILITOT 1.1  PROT 6.9  ALBUMIN 3.1*   No results for input(s): LIPASE, AMYLASE in the last 168 hours. No results for input(s): AMMONIA in the last 168 hours. Coagulation Profile: No results for input(s): INR, PROTIME in the last 168 hours. Cardiac Enzymes: No results for input(s): CKTOTAL, CKMB, CKMBINDEX, TROPONINI in the last 168 hours. BNP (last 3 results) No results  for input(s): PROBNP in the last 8760 hours. HbA1C: Recent Labs    01/23/20 2201  HGBA1C 6.2*   CBG: Recent Labs  Lab 01/24/20 1958 01/25/20 0017 01/25/20 0448 01/25/20 0751 01/25/20 1137  GLUCAP 188* 223* 262* 270* 267*   Lipid Profile: No results for input(s): CHOL, HDL, LDLCALC, TRIG, CHOLHDL, LDLDIRECT in the last 72 hours. Thyroid Function Tests: No results for input(s): TSH, T4TOTAL, FREET4, T3FREE, THYROIDAB in the last 72 hours. Anemia Panel: Recent Labs    01/23/20 2352  FERRITIN 929*   Sepsis Labs: Recent Labs  Lab 01/23/20 2157  PROCALCITON 0.12    Recent Results (from the past 240 hour(s))  Respiratory Panel by RT PCR (Flu A&B, Covid) - Nasopharyngeal Swab     Status: Abnormal   Collection Time: 01/23/20  9:57 PM   Specimen: Nasopharyngeal Swab  Result Value Ref Range Status   SARS Coronavirus 2 by RT PCR POSITIVE (A) NEGATIVE Final    Comment: RESULT CALLED TO, READ BACK BY AND VERIFIED WITHJonny Ruiz [facility] RN [id number] 01/23/20 HNM (NOTE) SARS-CoV-2 target nucleic acids are DETECTED.  SARS-CoV-2 RNA is generally detectable in upper respiratory specimens  during the acute phase of infection. Positive results are indicative of the presence of the identified virus, but do not rule out bacterial infection or co-infection with other pathogens not detected by the test. Clinical correlation with patient history and other diagnostic information is necessary to determine patient infection status. The expected result is Negative.  Fact Sheet for Patients:  https://www.moore.com/  Fact Sheet for Healthcare Providers: https://www.young.biz/  This test is not yet approved or cleared by the [place] FDA and  has been authorized for detection and/or diagnosis of SARS-CoV-2 by FDA under an Emergency Use Authorization (EUA).  This EUA will remain in effect (meaning this test can be  used) for the duration of  the  COVID-19 declaration under Section 564(b)(1) of the Act, 21 U.S.C. section 360bbb-3(b)(1), unless the authorization is terminated or revoked sooner.      Influenza A by PCR NEGATIVE NEGATIVE Final   Influenza B by PCR NEGATIVE NEGATIVE Final    Comment: (NOTE) The Xpert Xpress SARS-CoV-2/FLU/RSV assay is intended as an aid in  the diagnosis of influenza from Nasopharyngeal swab specimens and  should not be used as a sole basis for treatment. Nasal washings and  aspirates are unacceptable for Xpert Xpress SARS-CoV-2/FLU/RSV  testing.  Fact Sheet for Patients: https://www.moore.com/  Fact Sheet for Healthcare Providers: https://www.young.biz/  This test is not yet approved or cleared by the [place] FDA and  has been authorized for detection and/or diagnosis of SARS-CoV-2 by  FDA under an Emergency Use Authorization (EUA). This EUA will remain  in effect (meaning this test  can be used) for the duration of the  Covid-19 declaration under Section 564(b)(1) of the Act, 21  U.S.C. section 360bbb-3(b)(1), unless the authorization is  terminated or revoked. Performed at [facility], [address] [place], [place] [address]   Culture, blood (Routine X 2) w Reflex to ID Panel     Status: None (Preliminary result)   Collection Time: 01/23/20 10:01 PM   Specimen: BLOOD  Result Value Ref Range Status   Specimen Description BLOOD RIGHT ANTECUBITAL  Final   Special Requests   Final    BOTTLES DRAWN AEROBIC AND ANAEROBIC Blood Culture results may not be optimal due to an excessive volume of blood received in culture bottles   Culture   Final    NO GROWTH 1 DAY Performed at [facility], [address] [place], [place] [address]    Report Status PENDING  Incomplete  Culture, blood (Routine X 2) w Reflex to ID Panel     Status: None (Preliminary result)   Collection Time: 01/23/20 10:01 PM   Specimen: BLOOD  Result Value  Ref Range Status   Specimen Description BLOOD BLOOD LEFT FOREARM  Final   Special Requests   Final    BOTTLES DRAWN AEROBIC AND ANAEROBIC Blood Culture results may not be optimal due to an excessive volume of blood received in culture bottles   Culture   Final    NO GROWTH 1 DAY Performed at [facility], [address] [place], [place] [address]    Report Status PENDING  Incomplete      Radiology Studies: DG Chest 1 View  Result Date: 01/23/2020 CLINICAL DATA:  Difficulty breathing history of COVID EXAM: CHEST  1 VIEW COMPARISON:  11/05/2015 FINDINGS: Post sternotomy changes. Cardiomegaly. Streaky and patchy pulmonary consolidations at the lung bases and periphery consistent with history of COVID positivity. No pneumothorax. Calcified breast implants, smaller on the left side. IMPRESSION: 1. Streaky and patchy pulmonary consolidations consistent with pneumonia. 2. Cardiomegaly. Electronically Signed   By: Jasmine Pang M.D.   On: 01/23/2020 22:24     Scheduled Meds: . vitamin C  500 mg Oral Daily  . cholecalciferol  10,000 Units Oral Daily  . clopidogrel  75 mg Oral Daily  . feeding supplement  237 mL Oral TID BM  . insulin aspart  0-9 Units Subcutaneous Q4H  . insulin aspart protamine- aspart  25 Units Subcutaneous BID WC  . methylPREDNISolone (SOLU-MEDROL) injection  60 mg Intravenous BID  . multivitamin with minerals  1 tablet Oral Daily  . selenium  100 mcg Oral Daily  . sodium chloride flush  3 mL Intravenous Q12H  . sodium chloride flush  3 mL Intravenous Q12H  . zinc sulfate  220 mg Oral Daily   Continuous Infusions: . sodium chloride       LOS: 2 days     Darlin Priestly, MD [facility] If 7PM-7AM, please contact night-coverage 01/25/2020, 1:07 PM

## 2020-01-25 NOTE — Plan of Care (Signed)
Pt voided today 63ml. Bladder scan showed .  Dr Fran Lowes made aware and requested to try to get patient up to bsc.  Patient stated that she feels to weak to get up and used a bedpan.  Voided 81ml.  Dr. Fran Lowes notified and requested to insert In &out cath.  urine output via In&out cath.

## 2020-01-26 LAB — BASIC METABOLIC PANEL
Anion gap: 12 (ref 5–15)
BUN: 33 mg/dL — ABNORMAL HIGH (ref 8–23)
CO2: 27 mmol/L (ref 22–32)
Calcium: 9.6 mg/dL (ref 8.9–10.3)
Chloride: 98 mmol/L (ref 98–111)
Creatinine, Ser: 0.89 mg/dL (ref 0.44–1.00)
GFR, Estimated: >60 mL/min (ref >60)
Glucose, Bld: 264 mg/dL — ABNORMAL HIGH (ref 70–99)
Potassium: 4.5 mmol/L (ref 3.5–5.1)
Sodium: 137 mmol/L (ref 135–145)

## 2020-01-26 LAB — CBC
HCT: 39 % (ref 36.0–46.0)
Hemoglobin: 13.4 g/dL (ref 12.0–15.0)
MCH: 28.2 pg (ref 26.0–34.0)
MCHC: 34.4 g/dL (ref 30.0–36.0)
MCV: 82.1 fL (ref 80.0–100.0)
Platelets: 306 10*3/uL (ref 150–400)
RBC: 4.75 MIL/uL (ref 3.87–5.11)
RDW: 12.7 % (ref 11.5–15.5)
WBC: 8 10*3/uL (ref 4.0–10.5)
nRBC: 0 % (ref 0.0–0.2)

## 2020-01-26 LAB — FIBRIN DERIVATIVES D-DIMER (ARMC ONLY): Fibrin derivatives D-dimer (ARMC): 1475.47 ng/mL (FEU) — ABNORMAL HIGH (ref 0.00–499.00)

## 2020-01-26 LAB — GLUCOSE, CAPILLARY
Glucose-Capillary: 238 mg/dL — ABNORMAL HIGH (ref 70–99)
Glucose-Capillary: 237 mg/dL — ABNORMAL HIGH (ref 70–99)
Glucose-Capillary: 256 mg/dL — ABNORMAL HIGH (ref 70–99)
Glucose-Capillary: 244 mg/dL — ABNORMAL HIGH (ref 70–99)
Glucose-Capillary: 216 mg/dL — ABNORMAL HIGH (ref 70–99)
Glucose-Capillary: 260 mg/dL — ABNORMAL HIGH (ref 70–99)

## 2020-01-26 LAB — C-REACTIVE PROTEIN: CRP: 10.8 mg/dL — ABNORMAL HIGH (ref <1.0)

## 2020-01-26 LAB — MAGNESIUM: Magnesium: 2.5 mg/dL — ABNORMAL HIGH (ref 1.7–2.4)

## 2020-01-26 MED ORDER — INSULIN ASPART 100 UNIT/ML ~~LOC~~ SOLN
0.0000 [IU] | Freq: Three times a day (TID) | SUBCUTANEOUS | Status: DC
  Administered 2020-01-26: 11:00:00 5 [IU] via SUBCUTANEOUS
  Administered 2020-01-26 – 2020-01-27 (×2): 3 [IU] via SUBCUTANEOUS
  Administered 2020-01-27: 5 [IU] via SUBCUTANEOUS
  Administered 2020-01-27: 17:00:00 1 [IU] via SUBCUTANEOUS
  Administered 2020-01-28: 09:00:00 2 [IU] via SUBCUTANEOUS
  Filled 2020-01-26 (×7): qty 1

## 2020-01-26 MED ORDER — INSULIN ASPART 100 UNIT/ML ~~LOC~~ SOLN: 0.0000 [IU] | Freq: Three times a day (TID) | SUBCUTANEOUS | Status: DC

## 2020-01-26 MED ORDER — INSULIN ASPART PROT & ASPART (70-30 MIX) 100 UNIT/ML ~~LOC~~ SUSP
30.0000 [IU] | Freq: Two times a day (BID) | SUBCUTANEOUS | Status: DC
  Administered 2020-01-26 – 2020-01-27 (×4): 30 [IU] via SUBCUTANEOUS
  Filled 2020-01-26 (×4): qty 10

## 2020-01-26 NOTE — Plan of Care (Signed)
  Problem: Education: Goal: Knowledge of General Education information will improve Description: Including pain rating scale, medication(s)/side effects and non-pharmacologic comfort measures Outcome: Progressing   Problem: Health Behavior/Discharge Planning: Goal: Ability to manage health-related needs will improve Outcome: Progressing   Problem: Clinical Measurements: Goal: Ability to maintain clinical measurements within normal limits will improve Outcome: Progressing Goal: Will remain free from infection Outcome: Progressing Goal: Diagnostic test results will improve Outcome: Progressing Goal: Respiratory complications will improve Outcome: Progressing Goal: Cardiovascular complication will be avoided Outcome: Progressing   Problem: Nutrition: Goal: Adequate nutrition will be maintained Outcome: Progressing   Problem: Coping: Goal: Level of anxiety will decrease Outcome: Progressing   Problem: Elimination: Goal: Will not experience complications related to bowel motility Outcome: Progressing   Problem: Pain Managment: Goal: General experience of comfort will improve Outcome: Progressing   Problem: Safety: Goal: Ability to remain free from injury will improve Outcome: Progressing   Problem: Skin Integrity: Goal: Risk for impaired skin integrity will decrease Outcome: Progressing   Problem: Education: Goal: Knowledge of risk factors and measures for prevention of condition will improve Outcome: Progressing   Problem: Coping: Goal: Psychosocial and spiritual needs will be supported Outcome: Progressing   Problem: Respiratory: Goal: Will maintain a patent airway Outcome: Progressing Goal: Complications related to the disease process, condition or treatment will be avoided or minimized Outcome: Progressing   Problem: Education: Goal: Knowledge of risk factors and measures for prevention of condition will improve Outcome: Progressing   Problem:  Coping: Goal: Psychosocial and spiritual needs will be supported Outcome: Progressing   Problem: Respiratory: Goal: Will maintain a patent airway Outcome: Progressing Goal: Complications related to the disease process, condition or treatment will be avoided or minimized Outcome: Progressing

## 2020-01-26 NOTE — Progress Notes (Signed)
PROGRESS NOTE    [name]  VCB:[id number] DOB: 1945-08-30 DOA: 01/23/2020 PCP: [name], MD    Assessment & Plan:   Active Problems:   Hypothyroidism   HLD (hyperlipidemia)   CAD (coronary artery disease)   Type II diabetes mellitus (HCC)   Essential hypertension   Pneumonia due to COVID-19 virus   Acute respiratory failure due to COVID-19 [facility])   Chronic diastolic CHF (congestive heart failure) (HCC)    EMIYAH [name] is a 74 y.o. female with medical history significant of  DM2, CAD, DVT  HTN, PVD, HDL recent diagnosis of COVID-19 on 11/7,   Presented with shortness of breath she tested positive for Covid 7 days ago. Home O2 sats 80%. Patient reports worsening cough shortness of breath generalized weakness decreased p.o. intake and diarrhea She was started on Oxygen at home by her PCP She was scheduled to have MAB infusion done But read side effects and refused.  Daughter try to put on oxygen at home she was up to 8 L but despite that continued to be hypoxic EMS started on non-rebreather.  # Acute hypoxic respiratory failure 2/2 COVID PNA --O2 requirement currently at 6L. --refused all treatment on admission.  --CRP high ~19 on presentation.  Started on solumedrol.  CRP trending down. PLAN: --Continue supplemental O2 to keep sats >=92%, wean as tolerated --cont solumedrol 60 mg BID --trend CRP --Vit C and Zinc  # DM2 # Hyperglycemia exacerbated by steroid --A1c 6.2 --increase 70/30 to 30u BID --SSI  # Hx of CAD and STEMI --Pt has not been taking plavix for a while, but wanted to take it now. --cont plavix  # PVD --cont plavix   DVT prophylaxis: SCD/Compression stockings   Code Status: DNR No intubation. Family Communication: daughter updated on the phone today Status is: inpatient Dispo:   The patient is from: home Anticipated d/c is to: home Anticipated d/c date is: >3 days Patient currently is not medically stable to d/c due  to: 6L O2   Subjective and Interval History:  Pt reported she had been doing ROM exercises with her arms and legs in bed.  Able to void on her own after getting up to use the toilet.  O2 requirement stable at 6L.   Objective: Vitals:   01/26/20 0103 01/26/20 0644 01/26/20 0920 01/26/20 1216  BP: (!) 158/83 (!) 149/82 108/69 128/66  Pulse: 70 73 72 70  Resp:  18  18  Temp: 98.8 F (37.1 C) 97.8 F (36.6 C) 98.1 F (36.7 C) 99 F (37.2 C)  TempSrc: Oral Oral Oral Oral  SpO2: 95% 94% 94% 93%  Weight:      Height:        Intake/Output Summary (Last 24 hours) at 01/26/2020 1444 Last data filed at 01/26/2020 0523 Gross per 24 hour  Intake --  Output 1050 ml  Net -1050 ml   Filed Weights   01/23/20 2153  Weight: 79.4 kg    Examination:   Constitutional: NAD, AAOx3 HEENT: conjunctivae and lids normal, EOMI CV: No cyanosis.   RESP: normal respiratory effort, on 6L Extremities: No effusions, edema in BLE SKIN: warm, dry and intact Neuro: II - XII grossly intact.   Psych: better mood and affect.      Data Reviewed: I have personally reviewed following labs and imaging studies  CBC: Recent Labs  Lab 01/23/20 2157 01/25/20 0533 01/26/20 0520  WBC 5.1 3.4* 8.0  NEUTROABS 3.9  --   --  HGB 11.8* 12.7 13.4  HCT 34.6* 37.7 39.0  MCV 83.4 83.4 82.1  PLT 167 199 306   Basic Metabolic Panel: Recent Labs  Lab 01/23/20 2157 01/25/20 0533 01/26/20 0520  NA 131* 136 137  K 3.8 3.9 4.5  CL 95* 97* 98  CO2 24 27 27   GLUCOSE 114* 282* 264*  BUN 23 25* 33*  CREATININE 0.84 0.76 0.89  CALCIUM 8.4* 8.8* 9.6  MG 2.2 2.5* 2.5*   GFR: Estimated Creatinine Clearance: 56 mL/min (by C-G formula based on SCr of 0.89 mg/dL). Liver Function Tests: Recent Labs  Lab 01/23/20 2157  AST 44*  ALT 22  ALKPHOS 29*  BILITOT 1.1  PROT 6.9  ALBUMIN 3.1*   No results for input(s): LIPASE, AMYLASE in the last 168 hours. No results for input(s): AMMONIA in the last 168  hours. Coagulation Profile: No results for input(s): INR, PROTIME in the last 168 hours. Cardiac Enzymes: No results for input(s): CKTOTAL, CKMB, CKMBINDEX, TROPONINI in the last 168 hours. BNP (last 3 results) No results for input(s): PROBNP in the last 8760 hours. HbA1C: Recent Labs    01/23/20 2201  HGBA1C 6.2*   CBG: Recent Labs  Lab 01/25/20 2032 01/26/20 0100 01/26/20 0412 01/26/20 0950 01/26/20 1216  GLUCAP 260* 238* 237* 256* 244*   Lipid Profile: No results for input(s): CHOL, HDL, LDLCALC, TRIG, CHOLHDL, LDLDIRECT in the last 72 hours. Thyroid Function Tests: No results for input(s): TSH, T4TOTAL, FREET4, T3FREE, THYROIDAB in the last 72 hours. Anemia Panel: Recent Labs    01/23/20 2352  FERRITIN 929*   Sepsis Labs: Recent Labs  Lab 01/23/20 2157  PROCALCITON 0.12    Recent Results (from the past 240 hour(s))  Respiratory Panel by RT PCR (Flu A&B, Covid) - Nasopharyngeal Swab     Status: Abnormal   Collection Time: 01/23/20  9:57 PM   Specimen: Nasopharyngeal Swab  Result Value Ref Range Status   SARS Coronavirus 2 by RT PCR POSITIVE (A) NEGATIVE Final    Comment: RESULT CALLED TO, READ BACK BY AND VERIFIED WITH13/11/21 [facility] RN [facility] 01/23/20 HNM (NOTE) SARS-CoV-2 target nucleic acids are DETECTED.  SARS-CoV-2 RNA is generally detectable in upper respiratory specimens  during the acute phase of infection. Positive results are indicative of the presence of the identified virus, but do not rule out bacterial infection or co-infection with other pathogens not detected by the test. Clinical correlation with patient history and other diagnostic information is necessary to determine patient infection status. The expected result is Negative.  Fact Sheet for Patients:  13/11/21  Fact Sheet for Healthcare Providers: https://www.moore.com/  This test is not yet approved or cleared by the https://www.young.biz/ FDA and  has been authorized for detection and/or diagnosis of SARS-CoV-2 by FDA under an Emergency Use Authorization (EUA).  This EUA will remain in effect (meaning this test can be  used) for the duration of  the COVID-19 declaration under Section 564(b)(1) of the Act, 21 U.S.C. section 360bbb-3(b)(1), unless the authorization is terminated or revoked sooner.      Influenza A by PCR NEGATIVE NEGATIVE Final   Influenza B by PCR NEGATIVE NEGATIVE Final    Comment: (NOTE) The Xpert Xpress SARS-CoV-2/FLU/RSV assay is intended as an aid in  the diagnosis of influenza from Nasopharyngeal swab specimens and  should not be used as a sole basis for treatment. Nasal washings and  aspirates are unacceptable for Xpert Xpress SARS-CoV-2/FLU/RSV  testing.  Fact Sheet for Patients:  https://www.moore.com/  Fact Sheet for Healthcare Providers: https://www.young.biz/  This test is not yet approved or cleared by the [place] FDA and  has been authorized for detection and/or diagnosis of SARS-CoV-2 by  FDA under an Emergency Use Authorization (EUA). This EUA will remain  in effect (meaning this test can be used) for the duration of the  Covid-19 declaration under Section 564(b)(1) of the Act, 21  U.S.C. section 360bbb-3(b)(1), unless the authorization is  terminated or revoked. Performed at [facility], [address] [place], [place] [address]   Culture, blood (Routine X 2) w Reflex to ID Panel     Status: None (Preliminary result)   Collection Time: 01/23/20 10:01 PM   Specimen: BLOOD  Result Value Ref Range Status   Specimen Description BLOOD RIGHT ANTECUBITAL  Final   Special Requests   Final    BOTTLES DRAWN AEROBIC AND ANAEROBIC Blood Culture results may not be optimal due to an excessive volume of blood received in culture bottles   Culture   Final    NO GROWTH 2 DAYS Performed at [facility], [address], [place], [place] [address]    Report Status PENDING  Incomplete  Culture, blood (Routine X 2) w Reflex to ID Panel     Status: None (Preliminary result)   Collection Time: 01/23/20 10:01 PM   Specimen: BLOOD  Result Value Ref Range Status   Specimen Description BLOOD BLOOD LEFT FOREARM  Final   Special Requests   Final    BOTTLES DRAWN AEROBIC AND ANAEROBIC Blood Culture results may not be optimal due to an excessive volume of blood received in culture bottles   Culture   Final    NO GROWTH 2 DAYS Performed at [facility], [address], [place], [place] [address]    Report Status PENDING  Incomplete      Radiology Studies: No results found.   Scheduled Meds: . vitamin C  500 mg Oral Daily  . cholecalciferol  10,000 Units Oral Daily  . clopidogrel  75 mg Oral Daily  . feeding supplement  237 mL Oral TID BM  . insulin aspart  0-9 Units Subcutaneous TID WC  . insulin aspart protamine- aspart  30 Units Subcutaneous BID WC  . methylPREDNISolone (SOLU-MEDROL) injection  60 mg Intravenous BID  . multivitamin with minerals  1 tablet Oral Daily  . selenium  100 mcg Oral Daily  . sodium chloride flush  3 mL Intravenous Q12H  . sodium chloride flush  3 mL Intravenous Q12H  . zinc sulfate  220 mg Oral Daily   Continuous Infusions: . sodium chloride       LOS: 3 days     Darlin Priestly, MD [facility] If 7PM-7AM, please contact night-coverage 01/26/2020, 2:44 PM

## 2020-01-27 ENCOUNTER — Encounter: Payer: Self-pay | Admitting: *Deleted

## 2020-01-27 LAB — BASIC METABOLIC PANEL
Anion gap: 10 (ref 5–15)
BUN: 34 mg/dL — ABNORMAL HIGH (ref 8–23)
CO2: 29 mmol/L (ref 22–32)
Calcium: 9.5 mg/dL (ref 8.9–10.3)
Chloride: 102 mmol/L (ref 98–111)
Creatinine, Ser: 0.88 mg/dL (ref 0.44–1.00)
GFR, Estimated: >60 mL/min (ref >60)
Glucose, Bld: 271 mg/dL — ABNORMAL HIGH (ref 70–99)
Potassium: 4.5 mmol/L (ref 3.5–5.1)
Sodium: 141 mmol/L (ref 135–145)

## 2020-01-27 LAB — CBC
HCT: 38.7 % (ref 36.0–46.0)
Hemoglobin: 13.1 g/dL (ref 12.0–15.0)
MCH: 28.4 pg (ref 26.0–34.0)
MCHC: 33.9 g/dL (ref 30.0–36.0)
MCV: 83.9 fL (ref 80.0–100.0)
Platelets: 334 10*3/uL (ref 150–400)
RBC: 4.61 MIL/uL (ref 3.87–5.11)
RDW: 12.7 % (ref 11.5–15.5)
WBC: 9.1 10*3/uL (ref 4.0–10.5)
nRBC: 0 % (ref 0.0–0.2)

## 2020-01-27 LAB — GLUCOSE, CAPILLARY
Glucose-Capillary: 119 mg/dL — ABNORMAL HIGH (ref 70–99)
Glucose-Capillary: 149 mg/dL — ABNORMAL HIGH (ref 70–99)
Glucose-Capillary: 222 mg/dL — ABNORMAL HIGH (ref 70–99)
Glucose-Capillary: 259 mg/dL — ABNORMAL HIGH (ref 70–99)
Glucose-Capillary: 85 mg/dL (ref 70–99)

## 2020-01-27 LAB — C-REACTIVE PROTEIN: CRP: 5.6 mg/dL — ABNORMAL HIGH (ref <1.0)

## 2020-01-27 LAB — MAGNESIUM: Magnesium: 2.6 mg/dL — ABNORMAL HIGH (ref 1.7–2.4)

## 2020-01-27 LAB — FIBRIN DERIVATIVES D-DIMER (ARMC ONLY): Fibrin derivatives D-dimer (ARMC): 1415.99 ng/mL (FEU) — ABNORMAL HIGH (ref 0.00–499.00)

## 2020-01-27 MED ORDER — INSULIN ASPART 100 UNIT/ML ~~LOC~~ SOLN
10.0000 [IU] | Freq: Three times a day (TID) | SUBCUTANEOUS | Status: DC
  Administered 2020-01-27 (×2): 10 [IU] via SUBCUTANEOUS
  Filled 2020-01-27 (×2): qty 1

## 2020-01-27 MED ORDER — ENOXAPARIN SODIUM 40 MG/0.4ML ~~LOC~~ SOLN
40.0000 mg | SUBCUTANEOUS | Status: DC
  Administered 2020-01-27 – 2020-01-28 (×2): 40 mg via SUBCUTANEOUS
  Filled 2020-01-27 (×2): qty 0.4

## 2020-01-27 MED ORDER — INSULIN ASPART PROT & ASPART (70-30 MIX) 100 UNIT/ML ~~LOC~~ SUSP
34.0000 [IU] | Freq: Two times a day (BID) | SUBCUTANEOUS | Status: DC
  Administered 2020-01-28 (×2): 20 [IU] via SUBCUTANEOUS
  Filled 2020-01-27 (×2): qty 10

## 2020-01-27 NOTE — Progress Notes (Signed)
PROGRESS NOTE    [name]  ACZ:[id number] DOB: 1945-10-04 DOA: 01/23/2020 PCP: [name], MD    Assessment & Plan:   Active Problems:   Hypothyroidism   HLD (hyperlipidemia)   CAD (coronary artery disease)   Type II diabetes mellitus (HCC)   Essential hypertension   Pneumonia due to COVID-19 virus   Acute respiratory failure due to COVID-19 [facility])   Chronic diastolic CHF (congestive heart failure) (HCC)    [name] is a 74 y.o. female with medical history significant of  DM2, CAD, DVT  HTN, PVD, HDL recent diagnosis of COVID-19 on 11/7,   Presented with shortness of breath she tested positive for Covid 7 days ago. Home O2 sats 80%. Patient reports worsening cough shortness of breath generalized weakness decreased p.o. intake and diarrhea She was started on Oxygen at home by her PCP She was scheduled to have MAB infusion done But read side effects and refused.  Daughter try to put on oxygen at home she was up to 8 L but despite that continued to be hypoxic EMS started on non-rebreather.  # Acute hypoxic respiratory failure 2/2 COVID PNA --O2 requirement currently at 6L. --refused all treatment on admission.  --CRP high ~19 on presentation.  Started on solumedrol.  CRP trending down. PLAN: --Continue supplemental O2 to keep sats >=92%, wean as tolerated --cont solumedrol 60 mg BID --trend CRP --Vit C and Zinc  # DM2 # Hyperglycemia exacerbated by steroid --A1c 6.2 PLAN: --increase 70/30 to 34u BID tomorrow --d/c mealtime insulin --SSI  # Hx of CAD and STEMI --Pt has not been taking plavix for a while, but wanted to take it now. --cont plavix  # PVD --cont plavix   DVT prophylaxis: Lovenox SQ   Code Status: DNR No intubation. Family Communication:  Status is: inpatient Dispo:   The patient is from: home Anticipated d/c is to: home Anticipated d/c date is: >3 days Patient currently is not medically stable to d/c due to: 6L  O2   Subjective and Interval History:  Pt reported feeling better, able to get up to the bedside commode on her own.  Feeling stronger.     Objective: Vitals:   01/27/20 0756 01/27/20 0800 01/27/20 1241 01/27/20 1520  BP: (!) 146/85  138/79 111/65  Pulse: 81  86 83  Resp: 20  19 18   Temp: 98.3 F (36.8 C)  98.6 F (37 C) 98.6 F (37 C)  TempSrc:   Oral   SpO2: 94% 93% 95% 95%  Weight:      Height:        Intake/Output Summary (Last 24 hours) at 01/27/2020 1547 Last data filed at 01/27/2020 01/29/2020 Gross per 24 hour  Intake --  Output 700 ml  Net -700 ml   Filed Weights   01/23/20 2153  Weight: 79.4 kg    Examination:   Constitutional: NAD, AAOx3 HEENT: conjunctivae and lids normal, EOMI CV: No cyanosis.   RESP: reduced lung sounds, on 6L Extremities: No effusions, edema in BLE SKIN: warm, dry and intact Neuro: II - XII grossly intact.   Psych: better mood and affect.      Data Reviewed: I have personally reviewed following labs and imaging studies  CBC: Recent Labs  Lab 01/23/20 2157 01/25/20 0533 01/26/20 0520 01/27/20 0617  WBC 5.1 3.4* 8.0 9.1  NEUTROABS 3.9  --   --   --   HGB 11.8* 12.7 13.4 13.1  HCT 34.6* 37.7 39.0 38.7  MCV 83.4 83.4 82.1 83.9  PLT 167 199 306 334   Basic Metabolic Panel: Recent Labs  Lab 01/23/20 2157 01/25/20 0533 01/26/20 0520 01/27/20 0617  NA 131* 136 137 141  K 3.8 3.9 4.5 4.5  CL 95* 97* 98 102  CO2 24 27 27 29   GLUCOSE 114* 282* 264* 271*  BUN 23 25* 33* 34*  CREATININE 0.84 0.76 0.89 0.88  CALCIUM 8.4* 8.8* 9.6 9.5  MG 2.2 2.5* 2.5* 2.6*   GFR: Estimated Creatinine Clearance: 56.7 mL/min (by C-G formula based on SCr of 0.88 mg/dL). Liver Function Tests: Recent Labs  Lab 01/23/20 2157  AST 44*  ALT 22  ALKPHOS 29*  BILITOT 1.1  PROT 6.9  ALBUMIN 3.1*   No results for input(s): LIPASE, AMYLASE in the last 168 hours. No results for input(s): AMMONIA in the last 168 hours. Coagulation  Profile: No results for input(s): INR, PROTIME in the last 168 hours. Cardiac Enzymes: No results for input(s): CKTOTAL, CKMB, CKMBINDEX, TROPONINI in the last 168 hours. BNP (last 3 results) No results for input(s): PROBNP in the last 8760 hours. HbA1C: No results for input(s): HGBA1C in the last 72 hours. CBG: Recent Labs  Lab 01/26/20 1631 01/26/20 2134 01/27/20 0757 01/27/20 1200 01/27/20 1519  GLUCAP 216* 260* 259* 222* 149*   Lipid Profile: No results for input(s): CHOL, HDL, LDLCALC, TRIG, CHOLHDL, LDLDIRECT in the last 72 hours. Thyroid Function Tests: No results for input(s): TSH, T4TOTAL, FREET4, T3FREE, THYROIDAB in the last 72 hours. Anemia Panel: No results for input(s): VITAMINB12, FOLATE, FERRITIN, TIBC, IRON, RETICCTPCT in the last 72 hours. Sepsis Labs: Recent Labs  Lab 01/23/20 2157  PROCALCITON 0.12    Recent Results (from the past 240 hour(s))  Respiratory Panel by RT PCR (Flu A&B, Covid) - Nasopharyngeal Swab     Status: Abnormal   Collection Time: 01/23/20  9:57 PM   Specimen: Nasopharyngeal Swab  Result Value Ref Range Status   SARS Coronavirus 2 by RT PCR POSITIVE (A) NEGATIVE Final    Comment: RESULT CALLED TO, READ BACK BY AND VERIFIED WITH13/11/21 [facility] RN [facility] 01/23/20 HNM (NOTE) SARS-CoV-2 target nucleic acids are DETECTED.  SARS-CoV-2 RNA is generally detectable in upper respiratory specimens  during the acute phase of infection. Positive results are indicative of the presence of the identified virus, but do not rule out bacterial infection or co-infection with other pathogens not detected by the test. Clinical correlation with patient history and other diagnostic information is necessary to determine patient infection status. The expected result is Negative.  Fact Sheet for Patients:  13/11/21  Fact Sheet for Healthcare Providers: https://www.moore.com/  This test is not yet  approved or cleared by the https://www.young.biz/ FDA and  has been authorized for detection and/or diagnosis of SARS-CoV-2 by FDA under an Emergency Use Authorization (EUA).  This EUA will remain in effect (meaning this test can be  used) for the duration of  the COVID-19 declaration under Section 564(b)(1) of the Act, 21 U.S.C. section 360bbb-3(b)(1), unless the authorization is terminated or revoked sooner.      Influenza A by PCR NEGATIVE NEGATIVE Final   Influenza B by PCR NEGATIVE NEGATIVE Final    Comment: (NOTE) The Xpert Xpress SARS-CoV-2/FLU/RSV assay is intended as an aid in  the diagnosis of influenza from Nasopharyngeal swab specimens and  should not be used as a sole basis for treatment. Nasal washings and  aspirates are unacceptable for Xpert Xpress SARS-CoV-2/FLU/RSV  testing.  Fact Sheet for Patients: https://www.moore.com/  Fact Sheet for Healthcare Providers: https://www.young.biz/  This test is not yet approved or cleared by the [place] FDA and  has been authorized for detection and/or diagnosis of SARS-CoV-2 by  FDA under an Emergency Use Authorization (EUA). This EUA will remain  in effect (meaning this test can be used) for the duration of the  Covid-19 declaration under Section 564(b)(1) of the Act, 21  U.S.C. section 360bbb-3(b)(1), unless the authorization is  terminated or revoked. Performed at [facility], [address] [place], [place] [address]   Culture, blood (Routine X 2) w Reflex to ID Panel     Status: None (Preliminary result)   Collection Time: 01/23/20 10:01 PM   Specimen: BLOOD  Result Value Ref Range Status   Specimen Description BLOOD RIGHT ANTECUBITAL  Final   Special Requests   Final    BOTTLES DRAWN AEROBIC AND ANAEROBIC Blood Culture results may not be optimal due to an excessive volume of blood received in culture bottles   Culture   Final    NO GROWTH 3 DAYS Performed at [facility], [address] [place], [place] [address]    Report Status PENDING  Incomplete  Culture, blood (Routine X 2) w Reflex to ID Panel     Status: None (Preliminary result)   Collection Time: 01/23/20 10:01 PM   Specimen: BLOOD  Result Value Ref Range Status   Specimen Description BLOOD BLOOD LEFT FOREARM  Final   Special Requests   Final    BOTTLES DRAWN AEROBIC AND ANAEROBIC Blood Culture results may not be optimal due to an excessive volume of blood received in culture bottles   Culture   Final    NO GROWTH 3 DAYS Performed at [facility], [address], [place], [place] [address]    Report Status PENDING  Incomplete      Radiology Studies: No results found.   Scheduled Meds: . vitamin C  500 mg Oral Daily  . cholecalciferol  10,000 Units Oral Daily  . clopidogrel  75 mg Oral Daily  . enoxaparin (LOVENOX) injection  40 mg Subcutaneous Q24H  . feeding supplement  237 mL Oral TID BM  . insulin aspart  0-9 Units Subcutaneous TID WC  . insulin aspart  10 Units Subcutaneous TID WC  . insulin aspart protamine- aspart  30 Units Subcutaneous BID WC  . methylPREDNISolone (SOLU-MEDROL) injection  60 mg Intravenous BID  . multivitamin with minerals  1 tablet Oral Daily  . selenium  100 mcg Oral Daily  . sodium chloride flush  3 mL Intravenous Q12H  . zinc sulfate  220 mg Oral Daily   Continuous Infusions: . sodium chloride       LOS: 4 days     Darlin Priestly, MD [facility] If 7PM-7AM, please contact night-coverage 01/27/2020, 3:47 PM

## 2020-01-27 NOTE — Progress Notes (Signed)
Daily update given granddaughter

## 2020-01-27 NOTE — Progress Notes (Signed)
Spoke extensively with patients daughter, Bjorn Loser, regarding some concerns. Bjorn Loser prefers patient to remain out of the directory. In addition, she prefers the MD and RN provide an update daily. Bjorn Loser made specific requests regarding patient care that I will pass along to care team. Instructed daughter to call with any other concerns.   12:02 PM Bo Mcclintock, RN

## 2020-01-27 NOTE — Plan of Care (Signed)
°  Problem: Education: Goal: Knowledge of General Education information will improve Description: Including pain rating scale, medication(s)/side effects and non-pharmacologic comfort measures Outcome: Progressing   Problem: Health Behavior/Discharge Planning: Goal: Ability to manage health-related needs will improve Outcome: Progressing   Problem: Clinical Measurements: Goal: Ability to maintain clinical measurements within normal limits will improve Outcome: Progressing Goal: Will remain free from infection Outcome: Progressing Goal: Diagnostic test results will improve Outcome: Progressing Goal: Respiratory complications will improve Outcome: Progressing Goal: Cardiovascular complication will be avoided Outcome: Progressing   Problem: Activity: Goal: Risk for activity intolerance will decrease Outcome: Progressing   Problem: Nutrition: Goal: Adequate nutrition will be maintained Outcome: Progressing   Problem: Elimination: Goal: Will not experience complications related to bowel motility Outcome: Progressing Goal: Will not experience complications related to urinary retention Outcome: Progressing   Problem: Pain Managment: Goal: General experience of comfort will improve Outcome: Progressing   Problem: Safety: Goal: Ability to remain free from injury will improve Outcome: Progressing   Problem: Skin Integrity: Goal: Risk for impaired skin integrity will decrease Outcome: Progressing   Problem: Education: Goal: Knowledge of risk factors and measures for prevention of condition will improve Outcome: Progressing   Problem: Coping: Goal: Psychosocial and spiritual needs will be supported Outcome: Progressing   Problem: Respiratory: Goal: Will maintain a patent airway Outcome: Progressing Goal: Complications related to the disease process, condition or treatment will be avoided or minimized Outcome: Progressing   Problem: Education: Goal: Knowledge of risk  factors and measures for prevention of condition will improve Outcome: Progressing   Problem: Coping: Goal: Psychosocial and spiritual needs will be supported Outcome: Progressing   Problem: Respiratory: Goal: Will maintain a patent airway Outcome: Progressing Goal: Complications related to the disease process, condition or treatment will be avoided or minimized Outcome: Progressing

## 2020-01-27 NOTE — Progress Notes (Signed)
Daily update given [name] daughter

## 2020-01-27 NOTE — Progress Notes (Signed)
Inpatient Diabetes Program Recommendations  AACE/ADA: New Consensus Statement on Inpatient Glycemic Control (2015)  Target Ranges:  Prepandial:   less than 140 mg/dL      Peak postprandial:   less than 180 mg/dL (1-2 hours)      Critically ill patients:  140 - 180 mg/dL   Lab Results  Component Value Date   GLUCAP 222 (H) 01/27/2020   HGBA1C 6.2 (H) 01/23/2020    Review of Glycemic Control Results for TYKERIA, [name] (MRN [id number]) as of 01/27/2020 13:38  Ref. Range 01/26/2020 12:16 01/26/2020 16:31 01/26/2020 21:34 01/27/2020 07:57 01/27/2020 12:00  Glucose-Capillary Latest Ref Range: 70 - 99 mg/dL 747 (H) 340 (H) 370 (H) 259 (H) 222 (H)   Diabetes history:  DM2 Outpatient Diabetes medications:  Novolin 70/30 20-25 units bid Current orders for Inpatient glycemic control:  Novolog 70/30- 30 units bid Novolog sensitive tid with meals Novolog 10 units tid with meals  Solu-medrol 60 mg bid  Inpatient Diabetes Program Recommendations:    Recommend d/c of Novolog meal coverage tid with meals. Note that 70/30 has meal coverage built in with breakfast and supper dose.  May consider only having meal coverage ordered with lunch?? Also consider increasing 70/30 to 34 units bid.   Thanks,  Beryl Meager, RN, BC-ADM Inpatient Diabetes Coordinator Pager [phone] (8a-5p)

## 2020-01-27 NOTE — Progress Notes (Signed)
Pt given washcloths, assisted her with opening her chap stick and water bottle tops, foam ear padding placed on o2 tubing behind pts ears for comfort, room organized and de-cluttered, pt has everything in reach, pt states no further needs and that she will call when she needs assist

## 2020-01-27 NOTE — Plan of Care (Signed)
Had received an order to do another in/out cath since pt hadn't voided since the last in/out cath at shift change.  Pt stated she'd like to try to get up to [facility] and voided 450cc cloudy, amber urine.  She will try to drink more fluids.  Encouraged her to get up to [facility] to prevent further weakness.

## 2020-01-28 LAB — CBC
HCT: 35.9 % — ABNORMAL LOW (ref 36.0–46.0)
Hemoglobin: 12.3 g/dL (ref 12.0–15.0)
MCH: 28.7 pg (ref 26.0–34.0)
MCHC: 34.3 g/dL (ref 30.0–36.0)
MCV: 83.7 fL (ref 80.0–100.0)
Platelets: 289 10*3/uL (ref 150–400)
RBC: 4.29 MIL/uL (ref 3.87–5.11)
RDW: 12.7 % (ref 11.5–15.5)
WBC: 8.9 10*3/uL (ref 4.0–10.5)
nRBC: 0 % (ref 0.0–0.2)

## 2020-01-28 LAB — C-REACTIVE PROTEIN: CRP: 2.5 mg/dL — ABNORMAL HIGH (ref <1.0)

## 2020-01-28 LAB — GLUCOSE, CAPILLARY
Glucose-Capillary: 178 mg/dL — ABNORMAL HIGH (ref 70–99)
Glucose-Capillary: 184 mg/dL — ABNORMAL HIGH (ref 70–99)
Glucose-Capillary: 222 mg/dL — ABNORMAL HIGH (ref 70–99)
Glucose-Capillary: 194 mg/dL — ABNORMAL HIGH (ref 70–99)

## 2020-01-28 LAB — BASIC METABOLIC PANEL
Anion gap: 9 (ref 5–15)
BUN: 38 mg/dL — ABNORMAL HIGH (ref 8–23)
CO2: 31 mmol/L (ref 22–32)
Calcium: 9.2 mg/dL (ref 8.9–10.3)
Chloride: 101 mmol/L (ref 98–111)
Creatinine, Ser: 0.86 mg/dL (ref 0.44–1.00)
GFR, Estimated: >60 mL/min (ref >60)
Glucose, Bld: 193 mg/dL — ABNORMAL HIGH (ref 70–99)
Potassium: 4.5 mmol/L (ref 3.5–5.1)
Sodium: 141 mmol/L (ref 135–145)

## 2020-01-28 LAB — MAGNESIUM: Magnesium: 2.5 mg/dL — ABNORMAL HIGH (ref 1.7–2.4)

## 2020-01-28 MED ORDER — DEXAMETHASONE 6 MG PO TABS
6.0000 mg | ORAL_TABLET | Freq: Every day | ORAL | Status: DC
  Administered 2020-01-29: 08:00:00 6 mg via ORAL
  Filled 2020-01-28: qty 1

## 2020-01-28 NOTE — Plan of Care (Signed)
  Problem: Education: Goal: Knowledge of General Education information will improve Description: Including pain rating scale, medication(s)/side effects and non-pharmacologic comfort measures Outcome: Progressing   Problem: Health Behavior/Discharge Planning: Goal: Ability to manage health-related needs will improve Outcome: Progressing   Problem: Clinical Measurements: Goal: Ability to maintain clinical measurements within normal limits will improve Outcome: Progressing   Problem: Clinical Measurements: Goal: Will remain free from infection Outcome: Progressing   Problem: Clinical Measurements: Goal: Diagnostic test results will improve Outcome: Progressing   Problem: Clinical Measurements: Goal: Diagnostic test results will improve Outcome: Progressing

## 2020-01-28 NOTE — Evaluation (Signed)
Physical Therapy Evaluation Patient Details Name: [name] MRN: [id number] DOB: [birth year] Today's Date: 01/28/2020   History of Present Illness  74 y.o.femalewith medical history significant of DM2, CAD, DVT.  Here with weakness and hypoxia/SOB, found to be   Clinical Impression  Pt reports she was feeling very weak yesterday but that she is feeling much better now and eager to see how she can do.  She was able to do 2 bouts of ambulation, 35 ft with walker and then ~10 ft w/o AD.  Both were slow but relatively safe, she was clearly less steady w/o AD (reaching for surfaces), but showed stable vitals with each effort with O2 flow dropped to 94% with stable saturations.  Pt lives alone but reports that her daughter is very available to assist as needed.  She is open to the idea of HHPT but is not very interested in using a walker going forward, despite clearly being safer with it.     Follow Up Recommendations Home health PT    Equipment Recommendations  Rolling walker with 5" wheels (per progress may not need, pt not interested)    Recommendations for Other Services       Precautions / Restrictions Precautions Precautions: Fall Restrictions Weight Bearing Restrictions: No      Mobility  Bed Mobility Overal bed mobility: Modified Independent                  Transfers Overall transfer level: Modified independent Equipment used: Rolling walker (2 wheeled);None             General transfer comment: Pt is able to ambulate   Ambulation/Gait Ambulation/Gait assistance: Supervision Gait Distance (Feet): 35 Feet Assistive device: Rolling walker (2 wheeled);None       General Gait Details: first bout of ambulation ~35 ft with FWW, slow but steady gait.  Used 5 L and sats stayed in high 90s, after seated rest break, ~10 ft with no AD on 4L, O2 again remained in mid/upper 90s.  She was clearly less steady w/o AD use, reaching for wall and surfaces but no  LOBs.  Suggested using AD for now and she was quite opposed to the idea.  Stairs            Wheelchair Mobility    Modified Rankin (Stroke Patients Only)       Balance Overall balance assessment: Modified Independent                                           Pertinent Vitals/Pain Pain Assessment: No/denies pain    Home Living                        Prior Function                 Hand Dominance        Extremity/Trunk Assessment                Communication      Cognition Arousal/Alertness: Awake/alert Behavior During Therapy: WFL for tasks assessed/performed Overall Cognitive Status: Within Functional Limits for tasks assessed                                        General Comments  Exercises     Assessment/Plan    PT Assessment    PT Problem List         PT Treatment Interventions      PT Goals (Current goals can be found in the Care Plan section)       Frequency Min 2X/week   Barriers to discharge        Co-evaluation               AM-PAC PT "6 Clicks" Mobility  Outcome Measure Help needed turning from your back to your side while in a flat bed without using bedrails?: A Little Help needed moving from lying on your back to sitting on the side of a flat bed without using bedrails?: A Little Help needed moving to and from a bed to a chair (including a wheelchair)?: A Little Help needed standing up from a chair using your arms (e.g., wheelchair or bedside chair)?: A Little Help needed to walk in hospital room?: A Little Help needed climbing 3-5 steps with a railing? : A Little 6 Click Score: 18    End of Session Equipment Utilized During Treatment: Gait belt Activity Tolerance: Patient tolerated treatment well;Patient limited by fatigue Patient left: with call bell/phone within reach;with bed alarm set Nurse Communication: Mobility status PT Visit Diagnosis:  Unsteadiness on feet (R26.81);Muscle weakness (generalized) (M62.81);Difficulty in walking, not elsewhere classified (R26.2)    Time: 4332-9518 PT Time Calculation (min) (ACUTE ONLY): 42 min   Charges:   PT Evaluation $PT Eval Low Complexity: 1 Low PT Treatments $Gait Training: 8-22 mins        Malachi Pro, DPT 01/28/2020, 12:57 PM

## 2020-01-28 NOTE — Progress Notes (Signed)
PROGRESS NOTE    [name]  XQJ:[id number] DOB: 03/19/1945 DOA: 01/23/2020 PCP: [name], MD    Assessment & Plan:   Active Problems:   Hypothyroidism   HLD (hyperlipidemia)   CAD (coronary artery disease)   Type II diabetes mellitus (HCC)   Essential hypertension   Pneumonia due to COVID-19 virus   Acute respiratory failure due to COVID-19 [facility])   Chronic diastolic CHF (congestive heart failure) (HCC)    [name] is a 74 y.o. female with medical history significant of  DM2, CAD, DVT  HTN, PVD, HDL recent diagnosis of COVID-19 on 11/7,   Presented with shortness of breath she tested positive for Covid 7 days ago. Home O2 sats 80%. Patient reports worsening cough shortness of breath generalized weakness decreased p.o. intake and diarrhea She was started on Oxygen at home by her PCP She was scheduled to have MAB infusion done But read side effects and refused.  Daughter try to put on oxygen at home she was up to 8 L but despite that continued to be hypoxic EMS started on non-rebreather.  # Acute hypoxic respiratory failure 2/2 COVID PNA --O2 requirement currently at 6L. --refused all treatment on admission.  --CRP high ~19 on presentation.  Started on solumedrol.  CRP trending down. PLAN: --Continue supplemental O2 to keep sats >=92%, wean as tolerated --taper steroid to oral decadron since CRP now low --Vit C and Zinc --can discharge if O2 requirement 4L or less with walking  # DM2 # Hyperglycemia exacerbated by steroid --A1c 6.2 PLAN: --reduce 70/30 to 20u BID --SSI  # Hx of CAD and STEMI --Pt has not been taking plavix for a while, but asked to take it on presentation, but today asked to discontinue. --d/c plavix  # PVD --d/c plavix   DVT prophylaxis: Lovenox SQ   Code Status: DNR No intubation. Family Communication: offered to call daughter to update, but pt said she will update daughter herself. Status is: inpatient Dispo:     The patient is from: home Anticipated d/c is to: home Anticipated d/c date is: tomorrow Patient currently is not medically stable to d/c due to: 4L O2.  If pt can maintain O2 sat with 4L O2 or less while walking tomorrow, then can discharge.   Subjective and Interval History:  Pt reported feeling a whole lot better, much stronger now, can ambulate on her own.  O2 requirement improved down to 4L.   Objective: Vitals:   01/28/20 0440 01/28/20 0732 01/28/20 0737 01/28/20 1139  BP: (!) 133/96 (!) 161/74 116/64 (!) 164/76  Pulse: 79 72 70 81  Resp: 17 18 (!) 22 (!) 22  Temp: 98.3 F (36.8 C) 98.3 F (36.8 C) 97.7 F (36.5 C) 97.8 F (36.6 C)  TempSrc:  Oral Oral   SpO2: 95% 98% 99% 95%  Weight:      Height:        Intake/Output Summary (Last 24 hours) at 01/28/2020 1402 Last data filed at 01/28/2020 0700 Gross per 24 hour  Intake --  Output 450 ml  Net -450 ml   Filed Weights   01/23/20 2153  Weight: 79.4 kg    Examination:   Constitutional: NAD, AAOx3 HEENT: conjunctivae and lids normal, EOMI CV: No cyanosis.   RESP: reduced lung sounds, on 4L Extremities: No effusions, edema in BLE SKIN: warm, dry and intact Neuro: II - XII grossly intact.   Psych: better mood and affect.      Data Reviewed:  I have personally reviewed following labs and imaging studies  CBC: Recent Labs  Lab 01/23/20 2157 01/25/20 0533 01/26/20 0520 01/27/20 0617 01/28/20 0605  WBC 5.1 3.4* 8.0 9.1 8.9  NEUTROABS 3.9  --   --   --   --   HGB 11.8* 12.7 13.4 13.1 12.3  HCT 34.6* 37.7 39.0 38.7 35.9*  MCV 83.4 83.4 82.1 83.9 83.7  PLT 167 199 306 334 289   Basic Metabolic Panel: Recent Labs  Lab 01/23/20 2157 01/25/20 0533 01/26/20 0520 01/27/20 0617 01/28/20 0605  NA 131* 136 137 141 141  K 3.8 3.9 4.5 4.5 4.5  CL 95* 97* 98 102 101  CO2 24 27 27 29 31   GLUCOSE 114* 282* 264* 271* 193*  BUN 23 25* 33* 34* 38*  CREATININE 0.84 0.76 0.89 0.88 0.86  CALCIUM 8.4* 8.8* 9.6  9.5 9.2  MG 2.2 2.5* 2.5* 2.6* 2.5*   GFR: Estimated Creatinine Clearance: 58 mL/min (by C-G formula based on SCr of 0.86 mg/dL). Liver Function Tests: Recent Labs  Lab 01/23/20 2157  AST 44*  ALT 22  ALKPHOS 29*  BILITOT 1.1  PROT 6.9  ALBUMIN 3.1*   No results for input(s): LIPASE, AMYLASE in the last 168 hours. No results for input(s): AMMONIA in the last 168 hours. Coagulation Profile: No results for input(s): INR, PROTIME in the last 168 hours. Cardiac Enzymes: No results for input(s): CKTOTAL, CKMB, CKMBINDEX, TROPONINI in the last 168 hours. BNP (last 3 results) No results for input(s): PROBNP in the last 8760 hours. HbA1C: No results for input(s): HGBA1C in the last 72 hours. CBG: Recent Labs  Lab 01/27/20 1519 01/27/20 1957 01/27/20 2357 01/28/20 0730 01/28/20 1141  GLUCAP 149* 85 119* 178* 184*   Lipid Profile: No results for input(s): CHOL, HDL, LDLCALC, TRIG, CHOLHDL, LDLDIRECT in the last 72 hours. Thyroid Function Tests: No results for input(s): TSH, T4TOTAL, FREET4, T3FREE, THYROIDAB in the last 72 hours. Anemia Panel: No results for input(s): VITAMINB12, FOLATE, FERRITIN, TIBC, IRON, RETICCTPCT in the last 72 hours. Sepsis Labs: Recent Labs  Lab 01/23/20 2157  PROCALCITON 0.12    Recent Results (from the past 240 hour(s))  Respiratory Panel by RT PCR (Flu A&B, Covid) - Nasopharyngeal Swab     Status: Abnormal   Collection Time: 01/23/20  9:57 PM   Specimen: Nasopharyngeal Swab  Result Value Ref Range Status   SARS Coronavirus 2 by RT PCR POSITIVE (A) NEGATIVE Final    Comment: RESULT CALLED TO, READ BACK BY AND VERIFIED WITH13/11/21 [facility] RN [facility] 01/23/20 HNM (NOTE) SARS-CoV-2 target nucleic acids are DETECTED.  SARS-CoV-2 RNA is generally detectable in upper respiratory specimens  during the acute phase of infection. Positive results are indicative of the presence of the identified virus, but do not rule out bacterial infection or  co-infection with other pathogens not detected by the test. Clinical correlation with patient history and other diagnostic information is necessary to determine patient infection status. The expected result is Negative.  Fact Sheet for Patients:  13/11/21  Fact Sheet for Healthcare Providers: https://www.moore.com/  This test is not yet approved or cleared by the https://www.young.biz/ FDA and  has been authorized for detection and/or diagnosis of SARS-CoV-2 by FDA under an Emergency Use Authorization (EUA).  This EUA will remain in effect (meaning this test can be  used) for the duration of  the COVID-19 declaration under Section 564(b)(1) of the Act, 21 U.S.C. section 360bbb-3(b)(1), unless the authorization is terminated  or revoked sooner.      Influenza A by PCR NEGATIVE NEGATIVE Final   Influenza B by PCR NEGATIVE NEGATIVE Final    Comment: (NOTE) The Xpert Xpress SARS-CoV-2/FLU/RSV assay is intended as an aid in  the diagnosis of influenza from Nasopharyngeal swab specimens and  should not be used as a sole basis for treatment. Nasal washings and  aspirates are unacceptable for Xpert Xpress SARS-CoV-2/FLU/RSV  testing.  Fact Sheet for Patients: https://www.moore.com/  Fact Sheet for Healthcare Providers: https://www.young.biz/  This test is not yet approved or cleared by the [place] FDA and  has been authorized for detection and/or diagnosis of SARS-CoV-2 by  FDA under an Emergency Use Authorization (EUA). This EUA will remain  in effect (meaning this test can be used) for the duration of the  Covid-19 declaration under Section 564(b)(1) of the Act, 21  U.S.C. section 360bbb-3(b)(1), unless the authorization is  terminated or revoked. Performed at [facility], [address] [place], [place] [address]   Culture, blood (Routine X 2) w Reflex to ID Panel     Status:  None (Preliminary result)   Collection Time: 01/23/20 10:01 PM   Specimen: BLOOD  Result Value Ref Range Status   Specimen Description BLOOD RIGHT ANTECUBITAL  Final   Special Requests   Final    BOTTLES DRAWN AEROBIC AND ANAEROBIC Blood Culture results may not be optimal due to an excessive volume of blood received in culture bottles   Culture   Final    NO GROWTH 4 DAYS Performed at [facility], [address] [place], [place] [address]    Report Status PENDING  Incomplete  Culture, blood (Routine X 2) w Reflex to ID Panel     Status: None (Preliminary result)   Collection Time: 01/23/20 10:01 PM   Specimen: BLOOD  Result Value Ref Range Status   Specimen Description BLOOD BLOOD LEFT FOREARM  Final   Special Requests   Final    BOTTLES DRAWN AEROBIC AND ANAEROBIC Blood Culture results may not be optimal due to an excessive volume of blood received in culture bottles   Culture   Final    NO GROWTH 4 DAYS Performed at [facility], [address] [place], [place] [address]    Report Status PENDING  Incomplete      Radiology Studies: No results found.   Scheduled Meds: . vitamin C  500 mg Oral Daily  . cholecalciferol  10,000 Units Oral Daily  . [START ON 01/29/2020] dexamethasone  6 mg Oral Daily  . enoxaparin (LOVENOX) injection  40 mg Subcutaneous Q24H  . feeding supplement  237 mL Oral TID BM  . insulin aspart  0-9 Units Subcutaneous TID WC  . insulin aspart protamine- aspart  34 Units Subcutaneous BID WC  . multivitamin with minerals  1 tablet Oral Daily  . selenium  100 mcg Oral Daily  . sodium chloride flush  3 mL Intravenous Q12H  . zinc sulfate  220 mg Oral Daily   Continuous Infusions: . sodium chloride       LOS: 5 days     Darlin Priestly, MD [facility] If 7PM-7AM, please contact night-coverage 01/28/2020, 2:02 PM

## 2020-01-28 NOTE — Care Management Important Message (Signed)
Important Message  Patient Details  Name: [name] MRN: [id number] Date of Birth: 10/21/1945   Medicare Important Message Given:  Yes     Allayne Butcher, RN 01/28/2020, 9:12 AM

## 2020-01-29 DIAGNOSIS — U071 COVID-19: Principal | ICD-10-CM

## 2020-01-29 DIAGNOSIS — J9601 Acute respiratory failure with hypoxia: Secondary | ICD-10-CM

## 2020-01-29 DIAGNOSIS — I25119 Atherosclerotic heart disease of native coronary artery with unspecified angina pectoris: Secondary | ICD-10-CM

## 2020-01-29 DIAGNOSIS — I10 Essential (primary) hypertension: Secondary | ICD-10-CM

## 2020-01-29 LAB — BASIC METABOLIC PANEL
Anion gap: 8 (ref 5–15)
BUN: 32 mg/dL — ABNORMAL HIGH (ref 8–23)
CO2: 31 mmol/L (ref 22–32)
Calcium: 8.8 mg/dL — ABNORMAL LOW (ref 8.9–10.3)
Chloride: 102 mmol/L (ref 98–111)
Creatinine, Ser: 0.72 mg/dL (ref 0.44–1.00)
GFR, Estimated: >60 mL/min (ref >60)
Glucose, Bld: 74 mg/dL (ref 70–99)
Potassium: 3.9 mmol/L (ref 3.5–5.1)
Sodium: 141 mmol/L (ref 135–145)

## 2020-01-29 LAB — MAGNESIUM: Magnesium: 2.3 mg/dL (ref 1.7–2.4)

## 2020-01-29 LAB — CULTURE, BLOOD (ROUTINE X 2)
Culture: NO GROWTH
Culture: NO GROWTH

## 2020-01-29 LAB — CBC
HCT: 37.1 % (ref 36.0–46.0)
Hemoglobin: 12.3 g/dL (ref 12.0–15.0)
MCH: 28.1 pg (ref 26.0–34.0)
MCHC: 33.2 g/dL (ref 30.0–36.0)
MCV: 84.7 fL (ref 80.0–100.0)
Platelets: 286 10*3/uL (ref 150–400)
RBC: 4.38 MIL/uL (ref 3.87–5.11)
RDW: 12.6 % (ref 11.5–15.5)
WBC: 8 10*3/uL (ref 4.0–10.5)
nRBC: 0 % (ref 0.0–0.2)

## 2020-01-29 LAB — GLUCOSE, CAPILLARY: Glucose-Capillary: 66 mg/dL — ABNORMAL LOW (ref 70–99)

## 2020-01-29 LAB — C-REACTIVE PROTEIN: CRP: 1.5 mg/dL — ABNORMAL HIGH (ref <1.0)

## 2020-01-29 MED ORDER — DEXAMETHASONE 6 MG PO TABS: 6.0000 mg | ORAL_TABLET | Freq: Every day | ORAL | 0 refills | Status: AC

## 2020-01-29 MED ORDER — VITAMIN D3 25 MCG PO TABS: 10000.0000 [IU] | ORAL_TABLET | Freq: Every day | ORAL | 0 refills | Status: AC

## 2020-01-29 MED ORDER — ASCORBIC ACID 500 MG PO TABS: 500.0000 mg | ORAL_TABLET | Freq: Every day | ORAL | 0 refills | Status: AC

## 2020-01-29 MED ORDER — ADULT MULTIVITAMIN W/MINERALS CH: 1.0000 | ORAL_TABLET | Freq: Every day | ORAL | 0 refills | Status: AC

## 2020-01-29 MED ORDER — GUAIFENESIN-DM 100-10 MG/5ML PO SYRP: 10.0000 mL | ORAL_SOLUTION | Freq: Four times a day (QID) | ORAL | 0 refills | Status: AC | PRN

## 2020-01-29 MED ORDER — INSULIN ASPART PROT & ASPART (70-30 MIX) 100 UNIT/ML ~~LOC~~ SUSP
20.0000 [IU] | Freq: Two times a day (BID) | SUBCUTANEOUS | Status: DC
  Administered 2020-01-29: 08:00:00 20 [IU] via SUBCUTANEOUS
  Filled 2020-01-29: qty 10

## 2020-01-29 MED ORDER — ENSURE ENLIVE PO LIQD: 237.0000 mL | Freq: Three times a day (TID) | ORAL | 12 refills | Status: AC

## 2020-01-29 NOTE — TOC Initial Note (Signed)
Transition of Care [facility]) - Initial/Assessment Note    Patient Details  Name: [name] MRN: [id number] Date of Birth: [birth year]  [facility]) CM/SW Contact:    Allayne Butcher, RN Phone Number: 01/29/2020, 9:37 AM  Clinical Narrative:                 Patient admitted to the hospital with COVID requiring supplemental oxygen.  Patient is medically cleared to discharge home today with home O2 at 2L.   Patient lives at home alone, her daughter Bjorn Loser will be helping her out at discharge.  Daughter is very supportive.  Patient has agreed to home health PT and a rolling walker.  Referral for home Health given to [facility] with [facility] and referral for oxygen and rolling walker given to [place] with [facility].  Equipment will be delivered to the room before discharge.  Family will be picking the patient up around 1230 today.   Expected Discharge Plan: Home w Home Health Services Barriers to Discharge: Barriers Resolved   Patient Goals and CMS Choice Patient states their goals for this hospitalization and ongoing recovery are:: Patient is glad to be going home CMS Medicare.gov Compare Post Acute Care list provided to:: Patient Choice offered to / list presented to : Patient  Expected Discharge Plan and Services Expected Discharge Plan: Home w Home Health Services   Discharge Planning Services: CM Consult Post Acute Care Choice: Home Health Living arrangements for the past 2 months: Single Family Home Expected Discharge Date: 01/29/20               DME Arranged: Dan Humphreys rolling, Oxygen DME Agency: [employer] Date DME Agency Contacted: 01/29/20 Time DME Agency Contacted: [phone] Representative spoke with at DME Agency: Ian Malkin HH Arranged: PT HH Agency: [facility] ([facility] Date HH Agency Contacted: 01/29/20 Time HH Agency Contacted: [phone] Representative spoke with at [facility] Agency: Rosey Bath  Prior Living Arrangements/Services Living arrangements for the past 2  months: Single Family Home Lives with:: Self Patient language and need for interpreter reviewed:: Yes Do you feel safe going back to the place where you live?: Yes      Need for Family Participation in Patient Care: Yes (Comment) (COVID) Care giver support system in place?: Yes (comment) (daughter)   Criminal Activity/Legal Involvement Pertinent to Current Situation/Hospitalization: No - Comment as needed  Activities of Daily Living Home Assistive Devices/Equipment: None ADL Screening (condition at time of admission) Patient's cognitive ability adequate to safely complete daily activities?: Yes Is the patient deaf or have difficulty hearing?: No Does the patient have difficulty seeing, even when wearing glasses/contacts?: No Does the patient have difficulty concentrating, remembering, or making decisions?: No Patient able to express need for assistance with ADLs?: Yes Does the patient have difficulty dressing or bathing?: No Independently performs ADLs?: Yes (appropriate for developmental age) Does the patient have difficulty walking or climbing stairs?: Yes Weakness of Legs: Both Weakness of Arms/Hands: None  Permission Sought/Granted Permission sought to share information with : Case Manager, Magazine features editor, Family Supports Permission granted to share information with : Yes, Verbal Permission Granted  Share Information with NAME: Bjorn Loser  Permission granted to share info w AGENCY: [facility]  Permission granted to share info w Relationship: daughter     Emotional Assessment   Attitude/Demeanor/Rapport: Engaged Affect (typically observed): Accepting Orientation: : Oriented to Self, Oriented to Place, Oriented to  Time, Oriented to Situation Alcohol / Substance Use: Not Applicable Psych Involvement: No (comment)  Admission diagnosis:  Acute respiratory failure with hypoxia (HCC) [J96.01] Pneumonia due to COVID-19 virus [U07.1, J12.82] COVID-19 [U07.1] Patient  Active Problem List   Diagnosis Date Noted   Acute respiratory failure with hypoxia (HCC)    Pneumonia due to COVID-19 virus 01/23/2020   COVID-19 01/23/2020   Chronic diastolic CHF (congestive heart failure) (HCC) 01/23/2020   Bilateral carotid artery stenosis 12/29/2019   Essential hypertension 05/02/2017   Atherosclerosis of native arteries of extremity with rest pain (HCC) 05/02/2017   NSTEMI (non-ST elevated myocardial infarction) (HCC) 11/05/2015   HLD (hyperlipidemia)    CAD (coronary artery disease)    Chronic stable angina (HCC)    Hypertensive heart disease    Type II diabetes mellitus (HCC)    SOB (shortness of breath)    Unstable angina (HCC) 03/13/2014   Other and unspecified angina pectoris 12/09/2013   Equivalent angina (HCC) 11/19/2011   GOUT, UNSPECIFIED 05/18/2010   HYPERLIPIDEMIA 04/19/2010   Coronary atherosclerosis 04/19/2010   CAROTID ARTERY STENOSIS, BILATERAL 04/19/2010   ORTHOSTATIC HYPOTENSION 04/19/2010   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 [place] [address] Phone: [phone] Fax: [phone]     Social Determinants of Health (SDOH) Interventions    Readmission Risk Interventions No flowsheet data found.

## 2020-01-29 NOTE — Discharge Summary (Signed)
[facility] - [place] [facility]   PATIENT NAME: [name]    MR#:  [id number]  DATE OF BIRTH:  June 01, 1945  DATE OF ADMISSION:  01/23/2020 ADMITTING PHYSICIAN: Therisa Doyne, MD  DATE OF DISCHARGE: 01/29/2020  PRIMARY CARE PHYSICIAN: [name], MD    ADMISSION DIAGNOSIS:  Acute respiratory failure with hypoxia (HCC) [J96.01] Pneumonia due to COVID-19 virus [U07.1, J12.82] COVID-19 [U07.1]  DISCHARGE DIAGNOSIS:  acute hypoxic respiratory failure secondary to COVID 19 infection/pneumonia  SECONDARY DIAGNOSIS:   Past Medical History:  Diagnosis Date  . Arthritis    "hands, back" (12/09/2013)  . Aspirin allergy   . CAD (coronary artery disease)    a. 03/2010 s/p CABG x 3 (LIMA->LAD, VG->OM, VG->Diag);  b. 9/15 PCI native LCX (2.75x16 & 2.75x8 Promus DES');  c. 02/2014 Cath: LM nl, LAD 80-90/100p, LCX patent stents, OM1 100, OM2 99ost, RCA 100p, VG->Diag 100, VG->OM nl, LIMA->LAD nl, EF 55-65%-->Med Rx; d. 10/2015 NSTEMI/Cath: stable anatomy, patent LCX stent, 2/3 patent grafts as prev noted, EF 45%-->Med Rx w/ nitrate/ranexa.  . Carotid arterial disease (HCC)    a. 08/2015 U/S: RICA 40-59%, LICA 1-39%-->f/u 1 yr.  . Chronic stable angina (HCC)   . Diastolic dysfunction    a. 01/2014 Echo: EF 55%, Gr1 DD, triv MR.  . DVT (deep venous thrombosis) (HCC) 1970's   LLE  . HLD (hyperlipidemia)    statin intolerant  . Hypertensive heart disease   . Myocardial infarction (HCC) 03/04/2016   STEMI  . Obesity   . Orthostatic hypotension    previous Midodrine therapy  . Pneumonia 2013?  Marland Kitchen PONV (postoperative nausea and vomiting)   . PVD (peripheral vascular disease) (HCC)    a. prior balloon PTA to left leg in [place].  . Type II diabetes mellitus [facility])     HOSPITAL COURSE:   [name] a 74 y.o.femalewith medical history significant of DM2, CAD, DVT HTN, PVD, HDL recent diagnosis of COVID-19 on 11/7  Presented with shortness  of breath she tested positive for Covid 7 days ago. Home O2 sats 80%. She was started on Oxygen at home by her PCP She was scheduled to have MAB infusion done--did not take it  # Acute hypoxic respiratory failure 2/2 COVID PNA --O2 requirement currently at 2 liters--sats 95%. No respiratory distress --refused all treatment on admission.  --CRP high ~19 on presentation.  Started on solumedrol--now on oral steroids -- CRP trending down. --Continue supplemental O2 to keep sats >=92%, wean as tolerated --Vit C and Zinc -- discharge today since O2 requirement is improved on 2 liters. Pt asked to ajust oxygen according to need at home. She is anxious to go home--feels better.  # DM2 # Hyperglycemia exacerbated by steroid --A1c 6.2 --resume home insulin 70/30  --SSI  # Hx of CAD and STEMI --Pt has not been taking plavix for a while, but asked to take it on presentation, but today asked to discontinue. --d/c plavix  # PVD --d/c plavix--pt request   DVT prophylaxis: Lovenox SQ   Code Status: DNR No intubation. Family Communication: spoke with dter Bjorn Loser-- agrees with plan Status is: inpatient Dispo:   The patient is from: home Anticipated d/c is to: home Anticipated d/c date is: today with oxygen and HHPT  CONSULTS OBTAINED:    DRUG ALLERGIES:   Allergies  Allergen Reactions  . Bee Venom Anaphylaxis  . Statins Other (See Comments)    Crippling  . Lisinopril Other (See Comments)  Fluctuations BP  . Tylenol [Acetaminophen] Diarrhea and Hypertension  . Aspirin Hives  . Atorvastatin Other (See Comments)    Arthralgias  . Codeine Hives and Nausea Only  . Cortisone Hives  . Demerol Hives  . Meperidine Hives and Nausea And Vomiting  . Metformin Other (See Comments)    Intolerance  . Prednisone Hives    DISCHARGE MEDICATIONS:   Allergies as of 01/29/2020      Reactions   Bee Venom Anaphylaxis   Statins Other (See Comments)   Crippling   Lisinopril Other  (See Comments)   Fluctuations BP   Tylenol [acetaminophen] Diarrhea, Hypertension   Aspirin Hives   Atorvastatin Other (See Comments)   Arthralgias   Codeine Hives, Nausea Only   Cortisone Hives   Demerol Hives   Meperidine Hives, Nausea And Vomiting   Metformin Other (See Comments)   Intolerance   Prednisone Hives      Medication List    STOP taking these medications   azithromycin 250 MG tablet Commonly known as: ZITHROMAX   famotidine 20 MG tablet Commonly known as: PEPCID     TAKE these medications   ascorbic acid 500 MG tablet Commonly known as: VITAMIN C Take 1 tablet (500 mg total) by mouth daily. Start taking on: January 30, 2020   budesonide 0.5 MG/2ML nebulizer solution Commonly known as: PULMICORT Take 0.5 mg by nebulization every 6 (six) hours as needed.   dexamethasone 6 MG tablet Commonly known as: DECADRON Take 1 tablet (6 mg total) by mouth daily. Start taking on: January 30, 2020   feeding supplement Liqd Take 237 mLs by mouth 3 (three) times daily between meals.   guaiFENesin-dextromethorphan 100-10 MG/5ML syrup Commonly known as: ROBITUSSIN DM Take 10 mLs by mouth every 6 (six) hours as needed for cough.   insulin NPH-regular Human (70-30) 100 UNIT/ML injection Inject 20-25 Units into the skin 2 (two) times daily with a meal. Inject 20 units with morning meal and 20-25 units with evening meal   multivitamin with minerals Tabs tablet Take 1 tablet by mouth daily. Start taking on: January 30, 2020   nitroGLYCERIN 0.4 MG SL tablet Commonly known as: Nitrostat PLACE 1 TABLET UNDER TONGUE EVERY 5 MIN AS NEEDED FOR CHEST PAIN IF NO RELIEF IN15 MIN CALL 911 (MAX 3 TABS)   Vitamin D3 25 MCG tablet Commonly known as: Vitamin D Take 10 tablets (10,000 Units total) by mouth daily. Start taking on: January 30, 2020            Durable Medical Equipment  (From admission, onward)         Start     Ordered   01/29/20 0750  For home use  only DME oxygen  Once       Question Answer Comment  Length of Need 6 Months   Mode or (Route) Nasal cannula   Liters per Minute 2   Frequency Continuous (stationary and portable oxygen unit needed)   Oxygen conserving device Yes   Oxygen delivery system Gas      01/29/20 0749   01/28/20 1256  For home use only DME oxygen  Once       Question Answer Comment  Length of Need 6 Months   Mode or (Route) Nasal cannula   Liters per Minute 2   Frequency Continuous (stationary and portable oxygen unit needed)   Oxygen delivery system Gas      01/28/20 1255  If you experience worsening of your admission symptoms, develop shortness of breath, life threatening emergency, suicidal or homicidal thoughts you must seek medical attention immediately by calling 911 or calling your MD immediately  if symptoms less severe.  You Must read complete instructions/literature along with all the possible adverse reactions/side effects for all the Medicines you take and that have been prescribed to you. Take any new Medicines after you have completely understood and accept all the possible adverse reactions/side effects.   Please note  You were cared for by a hospitalist during your hospital stay. If you have any questions about your discharge medications or the care you received while you were in the hospital after you are discharged, you can call the unit and asked to speak with the hospitalist on call if the hospitalist that took care of you is not available. Once you are discharged, your primary care physician will handle any further medical issues. Please note that NO REFILLS for any discharge medications will be authorized once you are discharged, as it is imperative that you return to your primary care physician (or establish a relationship with a primary care physician if you do not have one) for your aftercare needs so that they can reassess your need for medications and monitor your lab  values. Today   SUBJECTIVE    I am ready to go home and take good shower. No resp distress. No new issues per RN VITAL SIGNS:  Blood pressure (!) 144/76, pulse 75, temperature 98.7 F (37.1 C), resp. rate 20, height 5\' 5"  (1.651 m), weight 79.4 kg, SpO2 95 %.  I/O:    Intake/Output Summary (Last 24 hours) at 01/29/2020 0812 Last data filed at 01/28/2020 1700 Gross per 24 hour  Intake --  Output 200 ml  Net -200 ml    PHYSICAL EXAMINATION:  GENERAL:  74 y.o.-year-old patient lying in the bed with no acute distress.  EYES: Pupils equal, round, reactive to light and accommodation. No scleral icterus.  HEENT: Head atraumatic, normocephalic. Oropharynx and nasopharynx clear.  NECK:  Supple, no jugular venous distention. No thyroid enlargement, no tenderness.  LUNGS: Normal breath sounds bilaterally, no wheezing, rales,rhonchi or crepitation. No use of accessory muscles of respiration.  CARDIOVASCULAR: S1, S2 normal. No murmurs, rubs, or gallops.  ABDOMEN: Soft, non-tender, non-distended. Bowel sounds present. No organomegaly or mass.  EXTREMITIES: No pedal edema, cyanosis, or clubbing.  NEUROLOGIC: Cranial nerves II through XII are intact. Muscle strength 5/5 in all extremities. Sensation intact. Gait not checked.  PSYCHIATRIC: The patient is alert and oriented x 3.  SKIN: No obvious rash, lesion, or ulcer.   DATA REVIEW:   CBC  Recent Labs  Lab 01/29/20 0544  WBC 8.0  HGB 12.3  HCT 37.1  PLT 286    Chemistries  Recent Labs  Lab 01/23/20 2157 01/25/20 0533 01/29/20 0544  NA 131*   < > 141  K 3.8   < > 3.9  CL 95*   < > 102  CO2 24   < > 31  GLUCOSE 114*   < > 74  BUN 23   < > 32*  CREATININE 0.84   < > 0.72  CALCIUM 8.4*   < > 8.8*  MG 2.2   < > 2.3  AST 44*  --   --   ALT 22  --   --   ALKPHOS 29*  --   --   BILITOT 1.1  --   --    < > =  values in this interval not displayed.    Microbiology Results   Recent Results (from the past 240 hour(s))   Respiratory Panel by RT PCR (Flu A&B, Covid) - Nasopharyngeal Swab     Status: Abnormal   Collection Time: 01/23/20  9:57 PM   Specimen: Nasopharyngeal Swab  Result Value Ref Range Status   SARS Coronavirus 2 by RT PCR POSITIVE (A) NEGATIVE Final    Comment: RESULT CALLED TO, READ BACK BY AND VERIFIED WITHJonny Ruiz [facility] RN [id number] 01/23/20 HNM (NOTE) SARS-CoV-2 target nucleic acids are DETECTED.  SARS-CoV-2 RNA is generally detectable in upper respiratory specimens  during the acute phase of infection. Positive results are indicative of the presence of the identified virus, but do not rule out bacterial infection or co-infection with other pathogens not detected by the test. Clinical correlation with patient history and other diagnostic information is necessary to determine patient infection status. The expected result is Negative.  Fact Sheet for Patients:  https://www.moore.com/  Fact Sheet for Healthcare Providers: https://www.young.biz/  This test is not yet approved or cleared by the [place] FDA and  has been authorized for detection and/or diagnosis of SARS-CoV-2 by FDA under an Emergency Use Authorization (EUA).  This EUA will remain in effect (meaning this test can be  used) for the duration of  the COVID-19 declaration under Section 564(b)(1) of the Act, 21 U.S.C. section 360bbb-3(b)(1), unless the authorization is terminated or revoked sooner.      Influenza A by PCR NEGATIVE NEGATIVE Final   Influenza B by PCR NEGATIVE NEGATIVE Final    Comment: (NOTE) The Xpert Xpress SARS-CoV-2/FLU/RSV assay is intended as an aid in  the diagnosis of influenza from Nasopharyngeal swab specimens and  should not be used as a sole basis for treatment. Nasal washings and  aspirates are unacceptable for Xpert Xpress SARS-CoV-2/FLU/RSV  testing.  Fact Sheet for Patients: https://www.moore.com/  Fact Sheet for  Healthcare Providers: https://www.young.biz/  This test is not yet approved or cleared by the [place] FDA and  has been authorized for detection and/or diagnosis of SARS-CoV-2 by  FDA under an Emergency Use Authorization (EUA). This EUA will remain  in effect (meaning this test can be used) for the duration of the  Covid-19 declaration under Section 564(b)(1) of the Act, 21  U.S.C. section 360bbb-3(b)(1), unless the authorization is  terminated or revoked. Performed at [facility], [address] [place], [place] [address]   Culture, blood (Routine X 2) w Reflex to ID Panel     Status: None   Collection Time: 01/23/20 10:01 PM   Specimen: BLOOD  Result Value Ref Range Status   Specimen Description BLOOD RIGHT ANTECUBITAL  Final   Special Requests   Final    BOTTLES DRAWN AEROBIC AND ANAEROBIC Blood Culture results may not be optimal due to an excessive volume of blood received in culture bottles   Culture   Final    NO GROWTH 5 DAYS Performed at [facility], [address], [place], [place] [address]    Report Status 01/29/2020 FINAL  Final  Culture, blood (Routine X 2) w Reflex to ID Panel     Status: None   Collection Time: 01/23/20 10:01 PM   Specimen: BLOOD  Result Value Ref Range Status   Specimen Description BLOOD BLOOD LEFT FOREARM  Final   Special Requests   Final    BOTTLES DRAWN AEROBIC AND ANAEROBIC Blood Culture results may not be optimal due to an  excessive volume of blood received in culture bottles   Culture   Final    NO GROWTH 5 DAYS Performed at [facility], [address] [place], [place] [address]    Report Status 01/29/2020 FINAL  Final    RADIOLOGY:  No results found.   CODE STATUS:     Code Status Orders  (From admission, onward)         Start     Ordered   01/24/20 0328  Do not attempt resuscitation (DNR)  Continuous       Question Answer Comment  In the event of cardiac or  respiratory ARREST Do not call a "code blue"   In the event of cardiac or respiratory ARREST Do not perform Intubation, CPR, defibrillation or ACLS   In the event of cardiac or respiratory ARREST Use medication by any route, position, wound care, and other measures to relive pain and suffering. May use oxygen, suction and manual treatment of airway obstruction as needed for comfort.      01/24/20 0327        Code Status History    Date Active Date Inactive Code Status Order ID Comments User Context   03/19/2018 1343 03/19/2018 1850 Full Code [id number]  Annice Needy, MD Inpatient   05/04/2017 1223 05/04/2017 1826 Full Code [id number]  Annice Needy, MD Inpatient   11/05/2015 0416 11/06/2015 1853 DNR [id number]  Arnaldo Natal, MD Inpatient   03/13/2014 1412 03/13/2014 2046 Full Code [id number]  [place], Peter M, MD Inpatient   12/09/2013 1019 12/10/2013 1421 Full Code [id number]  Corky Crafts, MD Inpatient   Advance Care Planning Activity    Advance Directive Documentation     Most Recent Value  Type of Advance Directive Healthcare Power of Attorney  Pre-existing out of facility DNR order (yellow form or pink MOST form) --  "MOST" Form in Place? --       TOTAL TIME TAKING CARE OF THIS PATIENT: *35* minutes.    Enedina Finner M.D  [facility]    CC: Primary care physician; [name], MD

## 2020-01-29 NOTE — Progress Notes (Signed)
Patient O2 saturations 95% 2L [place] at rest. 90% RA at rest.  O2 saturations 84%RA with minimal ambulation.  87-89% when 2L applied with ambulation. O2 saturations improved to 92% with 3L.

## 2020-01-30 NOTE — Progress Notes (Signed)
Completed a medical record check for CLEAR visit T18, M48. All concomitant medications have been updated. AE/SAE's have been reported to sponsor. Next phone call will be in approximately 3 months.

## 2020-03-16 ENCOUNTER — Telehealth: Payer: Self-pay | Admitting: Cardiology

## 2020-03-16 NOTE — Telephone Encounter (Signed)
Called patient to schedule recall. She states she refuses to wear a mask and will not come into the office until she is allowed to come without a mask.

## 2020-05-06 ENCOUNTER — Telehealth: Payer: Self-pay | Admitting: *Deleted

## 2020-05-06 NOTE — Telephone Encounter (Signed)
Completed phone call/chart review for CLEAR visit T19, M51. Subject has no new AE's or SAE's to report to sponsor at this time. All concomitant medications have been reviewed and updated if applicable. Next call/chart review will be in approximately 3 months.

## 2020-07-01 ENCOUNTER — Telehealth: Payer: Self-pay | Admitting: *Deleted

## 2020-07-01 NOTE — Telephone Encounter (Signed)
Completed telephone call/medical chart review for T20, M54 for CLEAR research study. All concomitant medications have been reviewed and updated if applicable. There are no new AE's or SAE's to report to sponsor at this time. Subject's next phone call/medical chart review will be the End of Study phone call which will be in June 2022.

## 2020-09-03 ENCOUNTER — Telehealth: Payer: Self-pay | Admitting: *Deleted

## 2020-09-03 DIAGNOSIS — Z006 Encounter for examination for normal comparison and control in clinical research program: Secondary | ICD-10-CM

## 2020-09-03 NOTE — Telephone Encounter (Signed)
I called patient for end of study visit for Clear Study. Patient's visit was done by phone. I assessed concomitant medications and adverse events . I reminded patient to eat heart healthy diet and do regular exercise program. I reminded patient I would call her in 30 days for final visit for Clear Study.

## 2020-09-24 ENCOUNTER — Telehealth: Payer: Self-pay | Admitting: *Deleted

## 2020-09-24 DIAGNOSIS — Z006 Encounter for examination for normal comparison and control in clinical research program: Secondary | ICD-10-CM

## 2020-09-24 NOTE — Telephone Encounter (Signed)
I called patient for 30-day post study visit for  Clear Study. Patient did not have any changes with concomitant medications or any new adverse events. I reminded patient to follow-up with physician for cholesterol management. I reminded patient to eat heart healthy diet and maintain regular exercise program.

## 2020-09-24 NOTE — Telephone Encounter (Signed)
I called patient for 30-day post study visit for Clear Study. I left message for patient to call me.

## 2024-04-12 ENCOUNTER — Other Ambulatory Visit: Payer: Self-pay | Admitting: Emergency Medicine

## 2024-04-12 DIAGNOSIS — R10A0 Flank pain, unspecified side: Secondary | ICD-10-CM

## 2024-04-16 ENCOUNTER — Encounter: Payer: Self-pay | Admitting: Emergency Medicine

## 2024-04-18 ENCOUNTER — Inpatient Hospital Stay
Admission: RE | Admit: 2024-04-18 | Discharge: 2024-04-18 | Attending: Emergency Medicine | Admitting: Emergency Medicine

## 2024-04-18 DIAGNOSIS — R10A0 Flank pain, unspecified side: Secondary | ICD-10-CM
